# Patient Record
Sex: Female | Born: 1995 | Race: Black or African American | Hispanic: No | Marital: Single | Smoking: Current every day smoker
Health system: Southern US, Community
[De-identification: ages and names within clinical notes are randomized; demographics above are authoritative.]

## PROBLEM LIST (undated history)

## (undated) ENCOUNTER — Inpatient Hospital Stay (HOSPITAL_COMMUNITY): Payer: Self-pay

## (undated) DIAGNOSIS — L732 Hidradenitis suppurativa: Secondary | ICD-10-CM

## (undated) DIAGNOSIS — J45909 Unspecified asthma, uncomplicated: Secondary | ICD-10-CM

## (undated) DIAGNOSIS — D649 Anemia, unspecified: Secondary | ICD-10-CM

## (undated) DIAGNOSIS — H9190 Unspecified hearing loss, unspecified ear: Secondary | ICD-10-CM

## (undated) HISTORY — DX: Unspecified asthma, uncomplicated: J45.909

## (undated) HISTORY — PX: INGUINAL HIDRADENITIS EXCISION: SHX1827

## (undated) HISTORY — PX: TONSILLECTOMY AND ADENOIDECTOMY: SHX28

## (undated) HISTORY — DX: Unspecified hearing loss, unspecified ear: H91.90

## (undated) HISTORY — PX: AXILLARY HIDRADENITIS EXCISION: SUR522

## (undated) HISTORY — DX: Hidradenitis suppurativa: L73.2

## (undated) HISTORY — DX: Anemia, unspecified: D64.9

---
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
## 2014-12-24 ENCOUNTER — Encounter: Payer: Self-pay | Admitting: Adult Health

## 2014-12-24 ENCOUNTER — Ambulatory Visit (INDEPENDENT_AMBULATORY_CARE_PROVIDER_SITE_OTHER): Payer: Medicaid Other | Admitting: Adult Health

## 2014-12-24 DIAGNOSIS — Z3201 Encounter for pregnancy test, result positive: Secondary | ICD-10-CM

## 2014-12-24 DIAGNOSIS — Z3490 Encounter for supervision of normal pregnancy, unspecified, unspecified trimester: Secondary | ICD-10-CM

## 2014-12-24 DIAGNOSIS — Z3400 Encounter for supervision of normal first pregnancy, unspecified trimester: Secondary | ICD-10-CM | POA: Insufficient documentation

## 2014-12-24 LAB — POCT URINE PREGNANCY: Preg Test, Ur: POSITIVE

## 2014-12-24 MED ORDER — PRENATAL PLUS 27-1 MG PO TABS: 1.0000 | ORAL_TABLET | Freq: Every day | ORAL | Status: DC

## 2014-12-24 NOTE — Patient Instructions (Signed)
First Trimester of Pregnancy The first trimester of pregnancy is from week 1 until the end of week 12 (months 1 through 3). A week after a sperm fertilizes an egg, the egg will implant on the wall of the uterus. This embryo will begin to develop into a baby. Genes from you and your partner are forming the baby. The female genes determine whether the baby is a boy or a girl. At 6-8 weeks, the eyes and face are formed, and the heartbeat can be seen on ultrasound. At the end of 12 weeks, all the baby's organs are formed.  Now that you are pregnant, you will want to do everything you can to have a healthy baby. Two of the most important things are to get good prenatal care and to follow your health care provider's instructions. Prenatal care is all the medical care you receive before the baby's birth. This care will help prevent, find, and treat any problems during the pregnancy and childbirth. BODY CHANGES Your body goes through many changes during pregnancy. The changes vary from woman to woman.   You may gain or lose a couple of pounds at first.  You may feel sick to your stomach (nauseous) and throw up (vomit). If the vomiting is uncontrollable, call your health care provider.  You may tire easily.  You may develop headaches that can be relieved by medicines approved by your health care provider.  You may urinate more often. Painful urination may mean you have a bladder infection.  You may develop heartburn as a result of your pregnancy.  You may develop constipation because certain hormones are causing the muscles that push waste through your intestines to slow down.  You may develop hemorrhoids or swollen, bulging veins (varicose veins).  Your breasts may begin to grow larger and become tender. Your nipples may stick out more, and the tissue that surrounds them (areola) may become darker.  Your gums may bleed and may be sensitive to brushing and flossing.  Dark spots or blotches (chloasma,  mask of pregnancy) may develop on your face. This will likely fade after the baby is born.  Your menstrual periods will stop.  You may have a loss of appetite.  You may develop cravings for certain kinds of food.  You may have changes in your emotions from day to day, such as being excited to be pregnant or being concerned that something may go wrong with the pregnancy and baby.  You may have more vivid and strange dreams.  You may have changes in your hair. These can include thickening of your hair, rapid growth, and changes in texture. Some women also have hair loss during or after pregnancy, or hair that feels dry or thin. Your hair will most likely return to normal after your baby is born. WHAT TO EXPECT AT YOUR PRENATAL VISITS During a routine prenatal visit:  You will be weighed to make sure you and the baby are growing normally.  Your blood pressure will be taken.  Your abdomen will be measured to track your baby's growth.  The fetal heartbeat will be listened to starting around week 10 or 12 of your pregnancy.  Test results from any previous visits will be discussed. Your health care provider may ask you:  How you are feeling.  If you are feeling the baby move.  If you have had any abnormal symptoms, such as leaking fluid, bleeding, severe headaches, or abdominal cramping.  If you have any questions. Other tests   that may be performed during your first trimester include:  Blood tests to find your blood type and to check for the presence of any previous infections. They will also be used to check for low iron levels (anemia) and Rh antibodies. Later in the pregnancy, blood tests for diabetes will be done along with other tests if problems develop.  Urine tests to check for infections, diabetes, or protein in the urine.  An ultrasound to confirm the proper growth and development of the baby.  An amniocentesis to check for possible genetic problems.  Fetal screens for  spina bifida and Down syndrome.  You may need other tests to make sure you and the baby are doing well. HOME CARE INSTRUCTIONS  Medicines  Follow your health care provider's instructions regarding medicine use. Specific medicines may be either safe or unsafe to take during pregnancy.  Take your prenatal vitamins as directed.  If you develop constipation, try taking a stool softener if your health care provider approves. Diet  Eat regular, well-balanced meals. Choose a variety of foods, such as meat or vegetable-based protein, fish, milk and low-fat dairy products, vegetables, fruits, and whole grain breads and cereals. Your health care provider will help you determine the amount of weight gain that is right for you.  Avoid raw meat and uncooked cheese. These carry germs that can cause birth defects in the baby.  Eating four or five small meals rather than three large meals a day may help relieve nausea and vomiting. If you start to feel nauseous, eating a few soda crackers can be helpful. Drinking liquids between meals instead of during meals also seems to help nausea and vomiting.  If you develop constipation, eat more high-fiber foods, such as fresh vegetables or fruit and whole grains. Drink enough fluids to keep your urine clear or pale yellow. Activity and Exercise  Exercise only as directed by your health care provider. Exercising will help you:  Control your weight.  Stay in shape.  Be prepared for labor and delivery.  Experiencing pain or cramping in the lower abdomen or low back is a good sign that you should stop exercising. Check with your health care provider before continuing normal exercises.  Try to avoid standing for long periods of time. Move your legs often if you must stand in one place for a long time.  Avoid heavy lifting.  Wear low-heeled shoes, and practice good posture.  You may continue to have sex unless your health care provider directs you  otherwise. Relief of Pain or Discomfort  Wear a good support bra for breast tenderness.   Take warm sitz baths to soothe any pain or discomfort caused by hemorrhoids. Use hemorrhoid cream if your health care provider approves.   Rest with your legs elevated if you have leg cramps or low back pain.  If you develop varicose veins in your legs, wear support hose. Elevate your feet for 15 minutes, 3-4 times a day. Limit salt in your diet. Prenatal Care  Schedule your prenatal visits by the twelfth week of pregnancy. They are usually scheduled monthly at first, then more often in the last 2 months before delivery.  Write down your questions. Take them to your prenatal visits.  Keep all your prenatal visits as directed by your health care provider. Safety  Wear your seat belt at all times when driving.  Make a list of emergency phone numbers, including numbers for family, friends, the hospital, and police and fire departments. General Tips    Ask your health care provider for a referral to a local prenatal education class. Begin classes no later than at the beginning of month 6 of your pregnancy.  Ask for help if you have counseling or nutritional needs during pregnancy. Your health care provider can offer advice or refer you to specialists for help with various needs.  Do not use hot tubs, steam rooms, or saunas.  Do not douche or use tampons or scented sanitary pads.  Do not cross your legs for long periods of time.  Avoid cat litter boxes and soil used by cats. These carry germs that can cause birth defects in the baby and possibly loss of the fetus by miscarriage or stillbirth.  Avoid all smoking, herbs, alcohol, and medicines not prescribed by your health care provider. Chemicals in these affect the formation and growth of the baby.  Schedule a dentist appointment. At home, brush your teeth with a soft toothbrush and be gentle when you floss. SEEK MEDICAL CARE IF:   You have  dizziness.  You have mild pelvic cramps, pelvic pressure, or nagging pain in the abdominal area.  You have persistent nausea, vomiting, or diarrhea.  You have a bad smelling vaginal discharge.  You have pain with urination.  You notice increased swelling in your face, hands, legs, or ankles. SEEK IMMEDIATE MEDICAL CARE IF:   You have a fever.  You are leaking fluid from your vagina.  You have spotting or bleeding from your vagina.  You have severe abdominal cramping or pain.  You have rapid weight gain or loss.  You vomit blood or material that looks like coffee grounds.  You are exposed to German measles and have never had them.  You are exposed to fifth disease or chickenpox.  You develop a severe headache.  You have shortness of breath.  You have any kind of trauma, such as from a fall or a car accident. Document Released: 11/15/2001 Document Revised: 04/07/2014 Document Reviewed: 10/01/2013 [employer] Patient Information 2015 [employer]. This information is not intended to replace advice given to you by your health care provider. Make sure you discuss any questions you have with your health care provider. Return in 2 weeks for dating US 

## 2014-12-24 NOTE — Progress Notes (Signed)
Subjective:     Patient ID: Loretta Moore, female   DOB: 05/05/1996, 19 y.o.   MRN: [id number]  HPI Monico BlitzJaylin is a 19 year old black female in to get birth control and has +UPT, has some cramping, no bleeding.  Review of Systems See HPI Reviewed past medical,surgical, social and family history. Reviewed medications and allergies.     Objective:   Physical Exam BP 112/70 mmHg  Ht 5' (1.524 m)  Wt 206 lb (93.441 kg)  BMI 40.23 kg/m2  LMP 11/20/2014   UPT + about 5 weeks by LMP, EDD 08/29/15, medicaid form given, Ok to take tylenol for pain, has hidradenitis.  Assessment:     Pregnant +UPT    Plan:    Rx prenatal plus #30 1 daily with 11 refills Return in 2 weeks for dating US Review handouts on first trimester and OB packet given

## 2015-01-05 ENCOUNTER — Telehealth: Payer: Self-pay | Admitting: Women's Health

## 2015-01-05 NOTE — Telephone Encounter (Signed)
Pt states she thinks she has had an allergic reaction to cinnamon, broke out in hives this am. Pt informed per Genelle GatherJennifer Griffin,NP can take Benadryl. If no improvement pt informed to call office back. Pt verbalized understanding.

## 2015-01-07 ENCOUNTER — Ambulatory Visit: Payer: Medicaid Other

## 2015-01-12 ENCOUNTER — Other Ambulatory Visit: Payer: Self-pay | Admitting: Adult Health

## 2015-01-12 ENCOUNTER — Ambulatory Visit (INDEPENDENT_AMBULATORY_CARE_PROVIDER_SITE_OTHER): Payer: Medicaid Other

## 2015-01-12 DIAGNOSIS — O26841 Uterine size-date discrepancy, first trimester: Secondary | ICD-10-CM

## 2015-01-12 NOTE — Progress Notes (Signed)
U/S-single IUP with +FCA noted, FHR- 171 bpm, CRL c/w 8+4 wks EDD 08/20/2015, cx appears closed, bilateral adnexa appears WNL

## 2015-01-20 ENCOUNTER — Encounter: Payer: Medicaid Other | Admitting: Women's Health

## 2015-01-27 ENCOUNTER — Encounter: Payer: Medicaid Other | Admitting: Women's Health

## 2015-02-04 ENCOUNTER — Encounter: Payer: Self-pay | Admitting: Advanced Practice Midwife

## 2015-02-04 ENCOUNTER — Ambulatory Visit (INDEPENDENT_AMBULATORY_CARE_PROVIDER_SITE_OTHER): Payer: Medicaid Other | Admitting: Advanced Practice Midwife

## 2015-02-04 VITALS — BP 130/50 | HR 100 | Wt 220.0 lb

## 2015-02-04 DIAGNOSIS — Z118 Encounter for screening for other infectious and parasitic diseases: Secondary | ICD-10-CM

## 2015-02-04 DIAGNOSIS — Z3401 Encounter for supervision of normal first pregnancy, first trimester: Secondary | ICD-10-CM

## 2015-02-04 DIAGNOSIS — Z331 Pregnant state, incidental: Secondary | ICD-10-CM

## 2015-02-04 DIAGNOSIS — L732 Hidradenitis suppurativa: Secondary | ICD-10-CM | POA: Insufficient documentation

## 2015-02-04 DIAGNOSIS — Z1389 Encounter for screening for other disorder: Secondary | ICD-10-CM

## 2015-02-04 DIAGNOSIS — Z130 Encounter for screening for diseases of the blood and blood-forming organs and certain disorders involving the immune mechanism: Secondary | ICD-10-CM

## 2015-02-04 DIAGNOSIS — Z113 Encounter for screening for infections with a predominantly sexual mode of transmission: Secondary | ICD-10-CM

## 2015-02-04 DIAGNOSIS — Z3490 Encounter for supervision of normal pregnancy, unspecified, unspecified trimester: Secondary | ICD-10-CM

## 2015-02-04 DIAGNOSIS — Z0184 Encounter for antibody response examination: Secondary | ICD-10-CM

## 2015-02-04 DIAGNOSIS — Z114 Encounter for screening for human immunodeficiency virus [HIV]: Secondary | ICD-10-CM

## 2015-02-04 DIAGNOSIS — Z3491 Encounter for supervision of normal pregnancy, unspecified, first trimester: Secondary | ICD-10-CM

## 2015-02-04 DIAGNOSIS — H9190 Unspecified hearing loss, unspecified ear: Secondary | ICD-10-CM | POA: Insufficient documentation

## 2015-02-04 DIAGNOSIS — Z0283 Encounter for blood-alcohol and blood-drug test: Secondary | ICD-10-CM

## 2015-02-04 DIAGNOSIS — Z1371 Encounter for nonprocreative screening for genetic disease carrier status: Secondary | ICD-10-CM

## 2015-02-04 LAB — POCT URINALYSIS DIPSTICK
GLUCOSE UA: NEGATIVE
Ketones, UA: NEGATIVE
Nitrite, UA: NEGATIVE
Protein, UA: NEGATIVE
RBC UA: NEGATIVE

## 2015-02-04 LAB — OB RESULTS CONSOLE GC/CHLAMYDIA
Chlamydia: POSITIVE
Gonorrhea: NEGATIVE

## 2015-02-04 LAB — OB RESULTS CONSOLE RUBELLA ANTIBODY, IGM: Rubella: IMMUNE

## 2015-02-04 LAB — OB RESULTS CONSOLE ABO/RH: RH Type: POSITIVE

## 2015-02-04 NOTE — Patient Instructions (Signed)
First Trimester of Pregnancy The first trimester of pregnancy is from week 1 until the end of week 12 (months 1 through 3). A week after a sperm fertilizes an egg, the egg will implant on the wall of the uterus. This embryo will begin to develop into a baby. Genes from you and your partner are forming the baby. The female genes determine whether the baby is a boy or a girl. At 6-8 weeks, the eyes and face are formed, and the heartbeat can be seen on ultrasound. At the end of 12 weeks, all the baby's organs are formed.  Now that you are pregnant, you will want to do everything you can to have a healthy baby. Two of the most important things are to get good prenatal care and to follow your health care provider's instructions. Prenatal care is all the medical care you receive before the baby's birth. This care will help prevent, find, and treat any problems during the pregnancy and childbirth. BODY CHANGES Your body goes through many changes during pregnancy. The changes vary from woman to woman.   You may gain or lose a couple of pounds at first.  You may feel sick to your stomach (nauseous) and throw up (vomit). If the vomiting is uncontrollable, call your health care provider.  You may tire easily.  You may develop headaches that can be relieved by medicines approved by your health care provider.  You may urinate more often. Painful urination may mean you have a bladder infection.  You may develop heartburn as a result of your pregnancy.  You may develop constipation because certain hormones are causing the muscles that push waste through your intestines to slow down.  You may develop hemorrhoids or swollen, bulging veins (varicose veins).  Your breasts may begin to grow larger and become tender. Your nipples may stick out more, and the tissue that surrounds them (areola) may become darker.  Your gums may bleed and may be sensitive to brushing and flossing.  Dark spots or blotches (chloasma,  mask of pregnancy) may develop on your face. This will likely fade after the baby is born.  Your menstrual periods will stop.  You may have a loss of appetite.  You may develop cravings for certain kinds of food.  You may have changes in your emotions from day to day, such as being excited to be pregnant or being concerned that something may go wrong with the pregnancy and baby.  You may have more vivid and strange dreams.  You may have changes in your hair. These can include thickening of your hair, rapid growth, and changes in texture. Some women also have hair loss during or after pregnancy, or hair that feels dry or thin. Your hair will most likely return to normal after your baby is born. WHAT TO EXPECT AT YOUR PRENATAL VISITS During a routine prenatal visit:  You will be weighed to make sure you and the baby are growing normally.  Your blood pressure will be taken.  Your abdomen will be measured to track your baby's growth.  The fetal heartbeat will be listened to starting around week 10 or 12 of your pregnancy.  Test results from any previous visits will be discussed. Your health care provider may ask you:  How you are feeling.  If you are feeling the baby move.  If you have had any abnormal symptoms, such as leaking fluid, bleeding, severe headaches, or abdominal cramping.  If you have any questions. Other tests   that may be performed during your first trimester include:  Blood tests to find your blood type and to check for the presence of any previous infections. They will also be used to check for low iron levels (anemia) and Rh antibodies. Later in the pregnancy, blood tests for diabetes will be done along with other tests if problems develop.  Urine tests to check for infections, diabetes, or protein in the urine.  An ultrasound to confirm the proper growth and development of the baby.  An amniocentesis to check for possible genetic problems.  Fetal screens for  spina bifida and Down syndrome.  You may need other tests to make sure you and the baby are doing well. HOME CARE INSTRUCTIONS  Medicines  Follow your health care provider's instructions regarding medicine use. Specific medicines may be either safe or unsafe to take during pregnancy.  Take your prenatal vitamins as directed.  If you develop constipation, try taking a stool softener if your health care provider approves. Diet  Eat regular, well-balanced meals. Choose a variety of foods, such as meat or vegetable-based protein, fish, milk and low-fat dairy products, vegetables, fruits, and whole grain breads and cereals. Your health care provider will help you determine the amount of weight gain that is right for you.  Avoid raw meat and uncooked cheese. These carry germs that can cause birth defects in the baby.  Eating four or five small meals rather than three large meals a day may help relieve nausea and vomiting. If you start to feel nauseous, eating a few soda crackers can be helpful. Drinking liquids between meals instead of during meals also seems to help nausea and vomiting.  If you develop constipation, eat more high-fiber foods, such as fresh vegetables or fruit and whole grains. Drink enough fluids to keep your urine clear or pale yellow. Activity and Exercise  Exercise only as directed by your health care provider. Exercising will help you:  Control your weight.  Stay in shape.  Be prepared for labor and delivery.  Experiencing pain or cramping in the lower abdomen or low back is a good sign that you should stop exercising. Check with your health care provider before continuing normal exercises.  Try to avoid standing for long periods of time. Move your legs often if you must stand in one place for a long time.  Avoid heavy lifting.  Wear low-heeled shoes, and practice good posture.  You may continue to have sex unless your health care provider directs you  otherwise. Relief of Pain or Discomfort  Wear a good support bra for breast tenderness.   Take warm sitz baths to soothe any pain or discomfort caused by hemorrhoids. Use hemorrhoid cream if your health care provider approves.   Rest with your legs elevated if you have leg cramps or low back pain.  If you develop varicose veins in your legs, wear support hose. Elevate your feet for 15 minutes, 3-4 times a day. Limit salt in your diet. Prenatal Care  Schedule your prenatal visits by the twelfth week of pregnancy. They are usually scheduled monthly at first, then more often in the last 2 months before delivery.  Write down your questions. Take them to your prenatal visits.  Keep all your prenatal visits as directed by your health care provider. Safety  Wear your seat belt at all times when driving.  Make a list of emergency phone numbers, including numbers for family, friends, the hospital, and police and fire departments. General Tips    Ask your health care provider for a referral to a local prenatal education class. Begin classes no later than at the beginning of month 6 of your pregnancy.  Ask for help if you have counseling or nutritional needs during pregnancy. Your health care provider can offer advice or refer you to specialists for help with various needs.  Do not use hot tubs, steam rooms, or saunas.  Do not douche or use tampons or scented sanitary pads.  Do not cross your legs for long periods of time.  Avoid cat litter boxes and soil used by cats. These carry germs that can cause birth defects in the baby and possibly loss of the fetus by miscarriage or stillbirth.  Avoid all smoking, herbs, alcohol, and medicines not prescribed by your health care provider. Chemicals in these affect the formation and growth of the baby.  Schedule a dentist appointment. At home, brush your teeth with a soft toothbrush and be gentle when you floss. SEEK MEDICAL CARE IF:   You have  dizziness.  You have mild pelvic cramps, pelvic pressure, or nagging pain in the abdominal area.  You have persistent nausea, vomiting, or diarrhea.  You have a bad smelling vaginal discharge.  You have pain with urination.  You notice increased swelling in your face, hands, legs, or ankles. SEEK IMMEDIATE MEDICAL CARE IF:   You have a fever.  You are leaking fluid from your vagina.  You have spotting or bleeding from your vagina.  You have severe abdominal cramping or pain.  You have rapid weight gain or loss.  You vomit blood or material that looks like coffee grounds.  You are exposed to German measles and have never had them.  You are exposed to fifth disease or chickenpox.  You develop a severe headache.  You have shortness of breath.  You have any kind of trauma, such as from a fall or a car accident. Document Released: 11/15/2001 Document Revised: 04/07/2014 Document Reviewed: 10/01/2013 [employer] Patient Information 2015 [employer]. This information is not intended to replace advice given to you by your health care provider. Make sure you discuss any questions you have with your health care provider.  

## 2015-02-04 NOTE — Progress Notes (Signed)
  Subjective:    Loretta Moore is a G1P0 4559w6d being seen today for her first obstetrical visit.  Her obstetrical history is significant for nothing   .  Pregnancy history fully reviewed.  Patient reports no complaints.  Filed Vitals:   02/04/15 1332  BP: 130/50  Pulse: 100  Weight: 220 lb (99.791 kg)    HISTORY: OB History  Gravida Para Term Preterm AB SAB TAB Ectopic Multiple Living  1             # Outcome Date GA Lbr Len/2nd Weight Sex Delivery Anes PTL Lv  1 Current              Past Medical History  Diagnosis Date  . Anemia   . Asthma   . Hidradenitis suppurativa     had excisions in axilla and groin  . Pregnant 12/24/2014  . Hard of hearing    Past Surgical History  Procedure Laterality Date  . Axillary hidradenitis excision    . Inguinal hidradenitis excision    . Tonsillectomy and adenoidectomy     Family History  Problem Relation Age of Onset  . Anemia Mother   . Hypertension Mother   . Other Mother     hidradenitis  . Epilepsy Father   . Other Sister     hidradenitis  . Epilepsy Brother   . Other Paternal Grandmother     aneursym  . Cancer Paternal Grandfather      Exam       Pelvic Exam:    Perineum: Normal Perineum   Vulva: normal   Vagina:  normal mucosa, normal discharge, no palpable nodules   Uterus Normal, Gravid, FH: 11     Cervix: normal   Adnexa: Not palpable   Urinary:  urethral meatus normal    System: Breast:  normal appearance, no masses or tenderness   Skin: normal coloration and turgor, no rashes    Neurologic: oriented, normal, normal mood   Extremities: normal strength, tone, and muscle mass   HEENT PERRLA   Mouth/Teeth mucous membranes moist, normal dentition   Neck supple and no masses   Cardiovascular: regular rate and rhythm   Respiratory:  appears well, vitals normal, no respiratory distress, acyanotic   Abdomen: soft, non-tender;  FHR: 150          Assessment:    Pregnancy: G1P0 Patient Active  Problem List   Diagnosis Date Noted  . Hidradenitis suppurativa   . Hard of hearing   . Pregnant 12/24/2014        Plan:     Initial labs drawn. Continue prenatal vitamins  Problem list reviewed and updated  Reviewed n/v relief measures and warning s/s to report  Reviewed recommended weight gain based on pre-gravid BMI  Encouraged well-balanced diet Genetic Screening discussed Integrated Screen: requested.  Ultrasound discussed; fetal survey: requested.  Follow up in 1 weeks for NT/IT, 4 weeks for OBV.  CRESENZO-DISHMAN,Lilliane Sposito 02/04/2015

## 2015-02-05 ENCOUNTER — Encounter: Payer: Self-pay | Admitting: Advanced Practice Midwife

## 2015-02-05 DIAGNOSIS — A749 Chlamydial infection, unspecified: Secondary | ICD-10-CM | POA: Insufficient documentation

## 2015-02-05 LAB — GC/CHLAMYDIA PROBE AMP
CHLAMYDIA, DNA PROBE: POSITIVE — AB
Neisseria gonorrhoeae by PCR: NEGATIVE

## 2015-02-06 ENCOUNTER — Other Ambulatory Visit: Payer: Self-pay | Admitting: Obstetrics and Gynecology

## 2015-02-06 DIAGNOSIS — Z3682 Encounter for antenatal screening for nuchal translucency: Secondary | ICD-10-CM

## 2015-02-06 LAB — URINE CULTURE

## 2015-02-09 ENCOUNTER — Other Ambulatory Visit: Payer: Medicaid Other

## 2015-02-09 ENCOUNTER — Ambulatory Visit (INDEPENDENT_AMBULATORY_CARE_PROVIDER_SITE_OTHER): Payer: Medicaid Other

## 2015-02-09 DIAGNOSIS — Z36 Encounter for antenatal screening of mother: Secondary | ICD-10-CM

## 2015-02-09 DIAGNOSIS — Z3682 Encounter for antenatal screening for nuchal translucency: Secondary | ICD-10-CM

## 2015-02-09 DIAGNOSIS — Z3401 Encounter for supervision of normal first pregnancy, first trimester: Secondary | ICD-10-CM

## 2015-02-09 LAB — CBC
HEMATOCRIT: 33.7 % — AB (ref 34.0–46.6)
Hemoglobin: 11.3 g/dL (ref 11.1–15.9)
MCH: 29.4 pg (ref 26.6–33.0)
MCHC: 33.5 g/dL (ref 31.5–35.7)
MCV: 88 fL (ref 79–97)
PLATELETS: 283 10*3/uL (ref 150–379)
RBC: 3.85 x10E6/uL (ref 3.77–5.28)
RDW: 15.3 % (ref 12.3–15.4)
WBC: 7.9 10*3/uL (ref 3.4–10.8)

## 2015-02-09 LAB — URINALYSIS, ROUTINE W REFLEX MICROSCOPIC
Bilirubin, UA: NEGATIVE
GLUCOSE, UA: NEGATIVE
KETONES UA: NEGATIVE
Nitrite, UA: NEGATIVE
PH UA: 6 (ref 5.0–7.5)
Protein, UA: NEGATIVE
RBC, UA: NEGATIVE
Urobilinogen, Ur: 0.2 mg/dL (ref 0.2–1.0)

## 2015-02-09 LAB — HEPATITIS B SURFACE ANTIGEN: Hepatitis B Surface Ag: NEGATIVE

## 2015-02-09 LAB — CYSTIC FIBROSIS MUTATION 97: GENE DIS ANAL CARRIER INTERP BLD/T-IMP: NOT DETECTED

## 2015-02-09 LAB — PMP SCREEN PROFILE (10S), URINE
Amphetamine Screen, Ur: NEGATIVE ng/mL
BENZODIAZEPINE SCREEN, URINE: NEGATIVE ng/mL
Barbiturate Screen, Ur: NEGATIVE ng/mL
Cannabinoids Ur Ql Scn: POSITIVE ng/mL
Cocaine(Metab.)Screen, Urine: NEGATIVE ng/mL
Creatinine(Crt), U: 155.5 mg/dL (ref 20.0–300.0)
Methadone Scn, Ur: NEGATIVE ng/mL
OPIATE SCRN UR: NEGATIVE ng/mL
OXYCODONE+OXYMORPHONE UR QL SCN: NEGATIVE ng/mL
PCP Scrn, Ur: NEGATIVE ng/mL
Ph of Urine: 5.6 (ref 4.5–8.9)
Propoxyphene, Screen: NEGATIVE ng/mL

## 2015-02-09 LAB — MICROSCOPIC EXAMINATION: Casts: NONE SEEN /lpf

## 2015-02-09 LAB — VARICELLA ZOSTER ANTIBODY, IGG: Varicella zoster IgG: 450 index (ref >165)

## 2015-02-09 LAB — ABO/RH: Rh Factor: POSITIVE

## 2015-02-09 LAB — RPR: RPR: NONREACTIVE

## 2015-02-09 LAB — SICKLE CELL SCREEN: Sickle Cell Screen: NEGATIVE

## 2015-02-09 LAB — ANTIBODY SCREEN: ANTIBODY SCREEN: NEGATIVE

## 2015-02-09 LAB — RUBELLA SCREEN: Rubella Antibodies, IGG: 3.51 index (ref >0.99)

## 2015-02-09 LAB — HIV ANTIBODY (ROUTINE TESTING W REFLEX): HIV Screen 4th Generation wRfx: NONREACTIVE

## 2015-02-09 NOTE — Progress Notes (Signed)
U/S(12+4wks)-single IUP with +FCA noted, FHR-160 bpm, CRL c/w dates, cx appears closed (3.4cm), bilateral adnexa appears WNL, NB present, NT-1.6544mm, anterior Gr 0 placenta

## 2015-02-11 ENCOUNTER — Other Ambulatory Visit: Payer: Self-pay | Admitting: Advanced Practice Midwife

## 2015-02-11 ENCOUNTER — Telehealth: Payer: Self-pay | Admitting: *Deleted

## 2015-02-11 LAB — MATERNAL SCREEN, INTEGRATED #1
Crown Rump Length: 60.3 mm
GEST. AGE ON COLLECTION DATE: 12.4 wk
Maternal Age at EDD: 21 years
NUMBER OF FETUSES: 1
Nuchal Translucency (NT): 1.4 mm
PAPP-A Value: 531.4 ng/mL
Weight: 219 [lb_av]

## 2015-02-11 MED ORDER — AZITHROMYCIN 500 MG PO TABS: 1000.0000 mg | ORAL_TABLET | Freq: Every day | ORAL | Status: DC

## 2015-02-11 NOTE — Telephone Encounter (Signed)
Spoke with pt. Pt was given a med for + CHL and was wondering how partner was going to be treated. I advised she will fill rx for herself and fill the refill for her partner. Advised no sex until after POC appt in 4 weeks. Pt voiced understanding. JSY

## 2015-02-11 NOTE — Progress Notes (Signed)
Pt informed of + chl.. Rx for pt and partner sent  No sex until POC

## 2015-03-04 ENCOUNTER — Encounter: Payer: Self-pay | Admitting: Women's Health

## 2015-03-04 ENCOUNTER — Ambulatory Visit (INDEPENDENT_AMBULATORY_CARE_PROVIDER_SITE_OTHER): Payer: Medicaid Other | Admitting: Women's Health

## 2015-03-04 VITALS — BP 100/60 | HR 81 | Wt 223.0 lb

## 2015-03-04 DIAGNOSIS — Z1389 Encounter for screening for other disorder: Secondary | ICD-10-CM

## 2015-03-04 DIAGNOSIS — Z369 Encounter for antenatal screening, unspecified: Secondary | ICD-10-CM

## 2015-03-04 DIAGNOSIS — Z3402 Encounter for supervision of normal first pregnancy, second trimester: Secondary | ICD-10-CM

## 2015-03-04 DIAGNOSIS — Z363 Encounter for antenatal screening for malformations: Secondary | ICD-10-CM

## 2015-03-04 DIAGNOSIS — Z331 Pregnant state, incidental: Secondary | ICD-10-CM

## 2015-03-04 LAB — POCT URINALYSIS DIPSTICK
Glucose, UA: NEGATIVE
KETONES UA: NEGATIVE
LEUKOCYTES UA: NEGATIVE
NITRITE UA: NEGATIVE
Protein, UA: NEGATIVE
RBC UA: NEGATIVE

## 2015-03-04 NOTE — Progress Notes (Signed)
Low-risk OB appointment G1P0 [id number] Estimated Date of Delivery: 08/20/15 BP 100/60 mmHg  Pulse 81  Wt 223 lb (101.152 kg)  LMP 11/20/2014  BP, weight, and urine reviewed.  Refer to obstetrical flow sheet for FH & FHR.  No fm yet. Denies cramping, lof, vb, or uti s/s. No complaints. Reviewed warning s/s to report. Recommended flu shot at HD or PCP Plan:  Continue routine obstetrical care  F/U in 4wks for OB appointment, anatomy u/s, and CT POC 2nd IT today

## 2015-03-04 NOTE — Patient Instructions (Signed)
Second Trimester of Pregnancy The second trimester is from week 13 through week 28, months 4 through 6. The second trimester is often a time when you feel your best. Your body has also adjusted to being pregnant, and you begin to feel better physically. Usually, morning sickness has lessened or quit completely, you may have more energy, and you may have an increase in appetite. The second trimester is also a time when the fetus is growing rapidly. At the end of the sixth month, the fetus is about 9 inches long and weighs about 1 pounds. You will likely begin to feel the baby move (quickening) between 18 and 20 weeks of the pregnancy. BODY CHANGES Your body goes through many changes during pregnancy. The changes vary from woman to woman.   Your weight will continue to increase. You will notice your lower abdomen bulging out.  You may begin to get stretch marks on your hips, abdomen, and breasts.  You may develop headaches that can be relieved by medicines approved by your health care provider.  You may urinate more often because the fetus is pressing on your bladder.  You may develop or continue to have heartburn as a result of your pregnancy.  You may develop constipation because certain hormones are causing the muscles that push waste through your intestines to slow down.  You may develop hemorrhoids or swollen, bulging veins (varicose veins).  You may have back pain because of the weight gain and pregnancy hormones relaxing your joints between the bones in your pelvis and as a result of a shift in weight and the muscles that support your balance.  Your breasts will continue to grow and be tender.  Your gums may bleed and may be sensitive to brushing and flossing.  Dark spots or blotches (chloasma, mask of pregnancy) may develop on your face. This will likely fade after the baby is born.  A dark line from your belly button to the pubic area (linea nigra) may appear. This will likely fade  after the baby is born.  You may have changes in your hair. These can include thickening of your hair, rapid growth, and changes in texture. Some women also have hair loss during or after pregnancy, or hair that feels dry or thin. Your hair will most likely return to normal after your baby is born. WHAT TO EXPECT AT YOUR PRENATAL VISITS During a routine prenatal visit:  You will be weighed to make sure you and the fetus are growing normally.  Your blood pressure will be taken.  Your abdomen will be measured to track your baby's growth.  The fetal heartbeat will be listened to.  Any test results from the previous visit will be discussed. Your health care provider may ask you:  How you are feeling.  If you are feeling the baby move.  If you have had any abnormal symptoms, such as leaking fluid, bleeding, severe headaches, or abdominal cramping.  If you have any questions. Other tests that may be performed during your second trimester include:  Blood tests that check for:  Low iron levels (anemia).  Gestational diabetes (between 24 and 28 weeks).  Rh antibodies.  Urine tests to check for infections, diabetes, or protein in the urine.  An ultrasound to confirm the proper growth and development of the baby.  An amniocentesis to check for possible genetic problems.  Fetal screens for spina bifida and Down syndrome. HOME CARE INSTRUCTIONS   Avoid all smoking, herbs, alcohol, and unprescribed   drugs. These chemicals affect the formation and growth of the baby.  Follow your health care provider's instructions regarding medicine use. There are medicines that are either safe or unsafe to take during pregnancy.  Exercise only as directed by your health care provider. Experiencing uterine cramps is a good sign to stop exercising.  Continue to eat regular, healthy meals.  Wear a good support bra for breast tenderness.  Do not use hot tubs, steam rooms, or saunas.  Wear your  seat belt at all times when driving.  Avoid raw meat, uncooked cheese, cat litter boxes, and soil used by cats. These carry germs that can cause birth defects in the baby.  Take your prenatal vitamins.  Try taking a stool softener (if your health care provider approves) if you develop constipation. Eat more high-fiber foods, such as fresh vegetables or fruit and whole grains. Drink plenty of fluids to keep your urine clear or pale yellow.  Take warm sitz baths to soothe any pain or discomfort caused by hemorrhoids. Use hemorrhoid cream if your health care provider approves.  If you develop varicose veins, wear support hose. Elevate your feet for 15 minutes, 3-4 times a day. Limit salt in your diet.  Avoid heavy lifting, wear low heel shoes, and practice good posture.  Rest with your legs elevated if you have leg cramps or low back pain.  Visit your dentist if you have not gone yet during your pregnancy. Use a soft toothbrush to brush your teeth and be gentle when you floss.  A sexual relationship may be continued unless your health care provider directs you otherwise.  Continue to go to all your prenatal visits as directed by your health care provider. SEEK MEDICAL CARE IF:   You have dizziness.  You have mild pelvic cramps, pelvic pressure, or nagging pain in the abdominal area.  You have persistent nausea, vomiting, or diarrhea.  You have a bad smelling vaginal discharge.  You have pain with urination. SEEK IMMEDIATE MEDICAL CARE IF:   You have a fever.  You are leaking fluid from your vagina.  You have spotting or bleeding from your vagina.  You have severe abdominal cramping or pain.  You have rapid weight gain or loss.  You have shortness of breath with chest pain.  You notice sudden or extreme swelling of your face, hands, ankles, feet, or legs.  You have not felt your baby move in over an hour.  You have severe headaches that do not go away with  medicine.  You have vision changes. Document Released: 11/15/2001 Document Revised: 11/26/2013 Document Reviewed: 01/22/2013 [employer] Patient Information 2015 [employer]. This information is not intended to replace advice given to you by your health care provider. Make sure you discuss any questions you have with your health care provider.  

## 2015-03-06 LAB — MATERNAL SCREEN, INTEGRATED #2
AFP MOM: 1.18
Alpha-Fetoprotein: 28.4 ng/mL
CROWN RUMP LENGTH: 60.3 mm
DIA MOM: 2.54
DIA VALUE: 376.9 pg/mL
ESTRIOL UNCONJUGATED: 0.49 ng/mL
GEST. AGE ON COLLECTION DATE: 12.4 wk
GESTATIONAL AGE: 15.7 wk
Maternal Age at EDD: 21 years
NUCHAL TRANSLUCENCY (NT): 1.4 mm
NUMBER OF FETUSES: 1
Nuchal Translucency MoM: 1.1
PAPP-A MoM: 0.88
PAPP-A Value: 531.4 ng/mL
Test Results:: NEGATIVE
WEIGHT: 219 [lb_av]
Weight: 223 [lb_av]
hCG MoM: 1.3
hCG Value: 37.1 IU/mL
uE3 MoM: 0.7

## 2015-03-27 ENCOUNTER — Telehealth: Payer: Self-pay | Admitting: Obstetrics & Gynecology

## 2015-03-27 ENCOUNTER — Other Ambulatory Visit: Payer: Self-pay | Admitting: Women's Health

## 2015-03-27 DIAGNOSIS — O99210 Obesity complicating pregnancy, unspecified trimester: Secondary | ICD-10-CM

## 2015-03-27 DIAGNOSIS — Z363 Encounter for antenatal screening for malformations: Secondary | ICD-10-CM

## 2015-03-27 NOTE — Telephone Encounter (Signed)
Pt states had abdominal pain yesterday after walking and called after line nurse who advised her to call our office back this am. Pt states has not had any pain this am, +FM, no vaginal. Pt informed to push fluids and can take tylenol for discomfort or pain. Pt verbalized understanding.

## 2015-04-01 ENCOUNTER — Ambulatory Visit (INDEPENDENT_AMBULATORY_CARE_PROVIDER_SITE_OTHER): Payer: Medicaid Other | Admitting: Women's Health

## 2015-04-01 ENCOUNTER — Encounter: Payer: Self-pay | Admitting: Women's Health

## 2015-04-01 ENCOUNTER — Ambulatory Visit (INDEPENDENT_AMBULATORY_CARE_PROVIDER_SITE_OTHER): Payer: Medicaid Other

## 2015-04-01 VITALS — BP 108/50 | HR 68 | Wt 227.0 lb

## 2015-04-01 DIAGNOSIS — Z1389 Encounter for screening for other disorder: Secondary | ICD-10-CM

## 2015-04-01 DIAGNOSIS — O99210 Obesity complicating pregnancy, unspecified trimester: Secondary | ICD-10-CM

## 2015-04-01 DIAGNOSIS — A749 Chlamydial infection, unspecified: Secondary | ICD-10-CM

## 2015-04-01 DIAGNOSIS — Z3402 Encounter for supervision of normal first pregnancy, second trimester: Secondary | ICD-10-CM

## 2015-04-01 DIAGNOSIS — Z1159 Encounter for screening for other viral diseases: Secondary | ICD-10-CM

## 2015-04-01 DIAGNOSIS — Z36 Encounter for antenatal screening of mother: Secondary | ICD-10-CM

## 2015-04-01 DIAGNOSIS — Z331 Pregnant state, incidental: Secondary | ICD-10-CM

## 2015-04-01 DIAGNOSIS — Z118 Encounter for screening for other infectious and parasitic diseases: Secondary | ICD-10-CM

## 2015-04-01 DIAGNOSIS — Z363 Encounter for antenatal screening for malformations: Secondary | ICD-10-CM

## 2015-04-01 LAB — OB RESULTS CONSOLE GC/CHLAMYDIA
Chlamydia: NEGATIVE
Gonorrhea: NEGATIVE

## 2015-04-01 LAB — POCT URINALYSIS DIPSTICK
Blood, UA: NEGATIVE
GLUCOSE UA: NEGATIVE
KETONES UA: NEGATIVE
LEUKOCYTES UA: NEGATIVE
Nitrite, UA: NEGATIVE
Protein, UA: NEGATIVE

## 2015-04-01 NOTE — Patient Instructions (Signed)
Circumcision: $507 at hospital, [facility], has to be paid up front before it is done. If you [facility] you can make payments during pregnancy. If you are interested in this, see receptionist at check-out.  If your baby is older than 28 days when you have the circumcision done at [facility], the fee will go up to $325.50.    Second Trimester of Pregnancy The second trimester is from week 13 through week 28, months 4 through 6. The second trimester is often a time when you feel your best. Your body has also adjusted to being pregnant, and you begin to feel better physically. Usually, morning sickness has lessened or quit completely, you may have more energy, and you may have an increase in appetite. The second trimester is also a time when the fetus is growing rapidly. At the end of the sixth month, the fetus is about 9 inches long and weighs about 1 pounds. You will likely begin to feel the baby move (quickening) between 18 and 20 weeks of the pregnancy. BODY CHANGES Your body goes through many changes during pregnancy. The changes vary from woman to woman.   Your weight will continue to increase. You will notice your lower abdomen bulging out.  You may begin to get stretch marks on your hips, abdomen, and breasts.  You may develop headaches that can be relieved by medicines approved by your health care provider.  You may urinate more often because the fetus is pressing on your bladder.  You may develop or continue to have heartburn as a result of your pregnancy.  You may develop constipation because certain hormones are causing the muscles that push waste through your intestines to slow down.  You may develop hemorrhoids or swollen, bulging veins (varicose veins).  You may have back pain because of the weight gain and pregnancy hormones relaxing your joints between the bones in your pelvis and as a result of a shift in weight and the muscles that  support your balance.  Your breasts will continue to grow and be tender.  Your gums may bleed and may be sensitive to brushing and flossing.  Dark spots or blotches (chloasma, mask of pregnancy) may develop on your face. This will likely fade after the baby is born.  A dark line from your belly button to the pubic area (linea nigra) may appear. This will likely fade after the baby is born.  You may have changes in your hair. These can include thickening of your hair, rapid growth, and changes in texture. Some women also have hair loss during or after pregnancy, or hair that feels dry or thin. Your hair will most likely return to normal after your baby is born. WHAT TO EXPECT AT YOUR PRENATAL VISITS During a routine prenatal visit:  You will be weighed to make sure you and the fetus are growing normally.  Your blood pressure will be taken.  Your abdomen will be measured to track your baby's growth.  The fetal heartbeat will be listened to.  Any test results from the previous visit will be discussed. Your health care provider may ask you:  How you are feeling.  If you are feeling the baby move.  If you have had any abnormal symptoms, such as leaking fluid, bleeding, severe headaches, or abdominal cramping.  If you have any questions. Other tests that may be performed during your second trimester include:  Blood tests that check for:  Low iron levels (anemia).  Gestational diabetes (between 24 and 28 weeks).  Rh antibodies.  Urine tests to check for infections, diabetes, or protein in the urine.  An ultrasound to confirm the proper growth and development of the baby.  An amniocentesis to check for possible genetic problems.  Fetal screens for spina bifida and Down syndrome. HOME CARE INSTRUCTIONS   Avoid all smoking, herbs, alcohol, and unprescribed drugs. These chemicals affect the formation and growth of the baby.  Follow your health care provider's instructions  regarding medicine use. There are medicines that are either safe or unsafe to take during pregnancy.  Exercise only as directed by your health care provider. Experiencing uterine cramps is a good sign to stop exercising.  Continue to eat regular, healthy meals.  Wear a good support bra for breast tenderness.  Do not use hot tubs, steam rooms, or saunas.  Wear your seat belt at all times when driving.  Avoid raw meat, uncooked cheese, cat litter boxes, and soil used by cats. These carry germs that can cause birth defects in the baby.  Take your prenatal vitamins.  Try taking a stool softener (if your health care provider approves) if you develop constipation. Eat more high-fiber foods, such as fresh vegetables or fruit and whole grains. Drink plenty of fluids to keep your urine clear or pale yellow.  Take warm sitz baths to soothe any pain or discomfort caused by hemorrhoids. Use hemorrhoid cream if your health care provider approves.  If you develop varicose veins, wear support hose. Elevate your feet for 15 minutes, 3-4 times a day. Limit salt in your diet.  Avoid heavy lifting, wear low heel shoes, and practice good posture.  Rest with your legs elevated if you have leg cramps or low back pain.  Visit your dentist if you have not gone yet during your pregnancy. Use a soft toothbrush to brush your teeth and be gentle when you floss.  A sexual relationship may be continued unless your health care provider directs you otherwise.  Continue to go to all your prenatal visits as directed by your health care provider. SEEK MEDICAL CARE IF:   You have dizziness.  You have mild pelvic cramps, pelvic pressure, or nagging pain in the abdominal area.  You have persistent nausea, vomiting, or diarrhea.  You have a bad smelling vaginal discharge.  You have pain with urination. SEEK IMMEDIATE MEDICAL CARE IF:   You have a fever.  You are leaking fluid from your vagina.  You have  spotting or bleeding from your vagina.  You have severe abdominal cramping or pain.  You have rapid weight gain or loss.  You have shortness of breath with chest pain.  You notice sudden or extreme swelling of your face, hands, ankles, feet, or legs.  You have not felt your baby move in over an hour.  You have severe headaches that do not go away with medicine.  You have vision changes. Document Released: 11/15/2001 Document Revised: 11/26/2013 Document Reviewed: 01/22/2013 [facility] Patient Information 2015 [place], [place]. This information is not intended to replace advice given to you by your health care provider. Make sure you discuss any questions you have with your health care provider.

## 2015-04-01 NOTE — Progress Notes (Signed)
Low-risk OB appointment G1P0 3181w6d Estimated Date of Delivery: 08/20/15 BP 108/50 mmHg  Pulse 68  Wt 227 lb (102.967 kg)  LMP 11/20/2014  BP, weight, and urine reviewed.  Refer to obstetrical flow sheet for FH & FHR.  Reports good fm.  Denies regular uc's, lof, vb, or uti s/s. No complaints. Reviewed today's anatomy u/s- limited views- will recheck at 28wks. Discussed ptl s/s, fm. Plan:  Continue routine obstetrical care  F/U in 4wks for OB appointment  CT POC today

## 2015-04-01 NOTE — Progress Notes (Signed)
US 19+6D measurement c/w dates,cephalic pos,ant pl,sdp of fluid 3.3cm,normal ov's,fht 160 bpm,efw 304g, limited view of heart and face.

## 2015-04-02 LAB — GC/CHLAMYDIA PROBE AMP
Chlamydia trachomatis, NAA: NEGATIVE
Neisseria gonorrhoeae by PCR: NEGATIVE

## 2015-04-13 ENCOUNTER — Telehealth: Payer: Self-pay | Admitting: Adult Health

## 2015-04-14 ENCOUNTER — Encounter: Payer: Self-pay | Admitting: Obstetrics and Gynecology

## 2015-04-14 NOTE — Telephone Encounter (Signed)
Pt given note indicating that we've discussed seat belt use and will monitor going forward. She received a ticket recently due to not having a belt on and has agreed to use it in the future.

## 2015-04-22 ENCOUNTER — Inpatient Hospital Stay (HOSPITAL_COMMUNITY)
Admission: AD | Admit: 2015-04-22 | Discharge: 2015-04-22 | Disposition: A | Discharge status: Home / Self Care | Payer: Medicaid Other | Source: Ambulatory Visit | Attending: Obstetrics & Gynecology | Admitting: Obstetrics & Gynecology

## 2015-04-22 ENCOUNTER — Telehealth: Payer: Self-pay | Admitting: *Deleted

## 2015-04-22 ENCOUNTER — Encounter (HOSPITAL_COMMUNITY): Payer: Self-pay

## 2015-04-22 DIAGNOSIS — Z3A22 22 weeks gestation of pregnancy: Secondary | ICD-10-CM | POA: Diagnosis not present

## 2015-04-22 DIAGNOSIS — A749 Chlamydial infection, unspecified: Secondary | ICD-10-CM

## 2015-04-22 DIAGNOSIS — O9989 Other specified diseases and conditions complicating pregnancy, childbirth and the puerperium: Secondary | ICD-10-CM | POA: Diagnosis not present

## 2015-04-22 DIAGNOSIS — L02419 Cutaneous abscess of limb, unspecified: Secondary | ICD-10-CM | POA: Insufficient documentation

## 2015-04-22 DIAGNOSIS — Z3402 Encounter for supervision of normal first pregnancy, second trimester: Secondary | ICD-10-CM

## 2015-04-22 DIAGNOSIS — Z87891 Personal history of nicotine dependence: Secondary | ICD-10-CM | POA: Insufficient documentation

## 2015-04-22 MED ORDER — CLINDAMYCIN HCL 300 MG PO CAPS: 300.0000 mg | ORAL_CAPSULE | Freq: Three times a day (TID) | ORAL | Status: DC

## 2015-04-22 MED ORDER — LIDOCAINE HCL (PF) 2 % IJ SOLN
10.0000 mL | Freq: Once | INTRAMUSCULAR | Status: DC
  Filled 2015-04-22: qty 10

## 2015-04-22 MED ORDER — OXYCODONE-ACETAMINOPHEN 5-325 MG PO TABS: 1.0000 | ORAL_TABLET | ORAL | Status: DC | PRN

## 2015-04-22 MED ORDER — OXYCODONE-ACETAMINOPHEN 7.5-325 MG PO TABS
1.0000 | ORAL_TABLET | Freq: Once | ORAL | Status: AC
  Administered 2015-04-22: 1 via ORAL
  Filled 2015-04-22: qty 1

## 2015-04-22 NOTE — Discharge Instructions (Signed)
Dressing Change °A dressing is a material placed over wounds. It keeps the wound clean, dry, and protected from further injury.  °BEFORE YOU BEGIN °· Get your supplies together. Things you may need include: °¨ Salt solution (saline). °¨ Flexible gauze bandage. °¨ Medicated cream. °¨ Tape. °¨ Gloves. °¨ Belly (abdominal) pads. °¨ Gauze squares. °¨ Plastic bags. °· Take pain medicine 30 minutes before the bandage change if you need it. °· Take a shower before you do the first bandage change of the day. Put plastic wrap or a bag over the dressing. °REMOVING YOUR OLD BANDAGE °· Wash your hands with soap and water. Dry your hands with a clean towel. °· Put on your gloves. °· Remove any tape. °· Remove the old bandage as told. If it sticks, put a small amount of warm water on it to loosen the bandage. °· Remove any gauze or packing tape in your wound. °· Take off your gloves. °· Put the gloves, tape, gauze, or any packing tape in a plastic bag. °CHANGING YOUR BANDAGE °· Open the supplies. °· Take the cap off the salt solution. °· Open the gauze. Leave the gauze on the inside of the package. °· Put on your gloves. °· Clean your wound as told by your doctor. °· Keep your wound dry if your doctor told you to do so. °· Your doctor may tell you to do one or more of the following: °¨ Pick up the gauze. Pour the salt solution over the gauze. Squeeze out the extra salt solution. °¨ Put medicated cream or other medicine on your wound. °¨ Put solution soaked gauze only in your wound, not on the skin around it. °¨ Pack your wound loosely. °¨ Put dry gauze on your wound. °¨ Put belly pads over the dry gauze if your bandages soak through. °· Tape the bandage in place so it will not fall off. Do not wrap the tape all the way around your arm or leg. °· Wrap the bandage with the flexible gauze bandage as told by your doctor. °· Take off your gloves. Put them in the plastic bag with the old bandage. Tie the bag shut and throw it  away. °· Keep the bandage clean and dry. °· Wash your hands. °GET HELP RIGHT AWAY IF:  °· Your skin around the wound looks red. °· Your wound feels more tender or sore. °· You see yellowish-white fluid (pus) in the wound. °· Your wound smells bad. °· You have a fever. °· Your skin around the wound has a red rash that itches and burns. °· You see black or yellow skin in your wound that was not there before. °· You feel sick to your stomach (nauseous), throw up (vomit), and feel very tired. °Document Released: 02/17/2009 Document Revised: 04/07/2014 Document Reviewed: 10/02/2011 °ExitCare® Patient Information ©2015 [employer]. This information is not intended to replace advice given to you by your health care provider. Make sure you discuss any questions you have with your health care provider. ° °

## 2015-04-22 NOTE — Progress Notes (Signed)
Patient signed consent for I&D.

## 2015-04-22 NOTE — MAU Provider Note (Signed)
History     CSN: [id number]  Arrival date and time: 04/22/15 1512   None     Chief Complaint  Patient presents with  . Recurrent Skin Infections   HPI  Patient is 19 y.o. G1P0 1157w6d here with complaints of abscess on thigh.  Reports noting for past week but now numb and more painful.  Taking tylenol prn pain..  +FM, denies LOF, VB, contractions, vaginal discharge.  OB History    Gravida Para Term Preterm AB TAB SAB Ectopic Multiple Living   1               Past Medical History  Diagnosis Date  . Anemia   . Asthma   . Hidradenitis suppurativa     had excisions in axilla and groin  . Pregnant 12/24/2014  . Hard of hearing     Past Surgical History  Procedure Laterality Date  . Axillary hidradenitis excision    . Inguinal hidradenitis excision    . Tonsillectomy and adenoidectomy      Family History  Problem Relation Age of Onset  . Anemia Mother   . Hypertension Mother   . Other Mother     hidradenitis  . Epilepsy Father   . Other Sister     hidradenitis  . Epilepsy Brother   . Other Paternal Grandmother     aneursym  . Cancer Paternal Grandfather     History  Substance Use Topics  . Smoking status: Former Smoker -- 0.00 packs/day for 0 years  . Smokeless tobacco: Never Used  . Alcohol Use: No    Allergies:  Allergies  Allergen Reactions  . Augmentin [Amoxicillin-Pot Clavulanate] Hives  . Ceclor [Cefaclor] Hives  . Penicillins Hives  . Sulfa Antibiotics Hives    Prescriptions prior to admission  Medication Sig Dispense Refill Last Dose  . acetaminophen (TYLENOL) 325 MG tablet Take 650 mg by mouth every 6 (six) hours as needed.   Taking  . azithromycin (ZITHROMAX) 500 MG tablet Take 2 tablets (1,000 mg total) by mouth daily. (Patient not taking: Reported on 04/01/2015) 2 tablet 1 Not Taking  . diphenhydrAMINE (SOMINEX) 25 MG tablet Take 25 mg by mouth at bedtime as needed for sleep.   Taking  . prenatal vitamin w/FE, FA (PRENATAL 1 + 1) 27-1  MG TABS tablet Take 1 tablet by mouth daily at 12 noon. 30 each 11 Taking    Review of Systems  Constitutional: Negative for fever and chills.  HENT: Negative for congestion.   Respiratory: Negative for cough and shortness of breath.   Cardiovascular: Negative for chest pain and leg swelling.  Gastrointestinal: Negative for heartburn, nausea, vomiting and diarrhea.  Genitourinary: Negative for dysuria, urgency, frequency and hematuria.  Skin: Negative for itching and rash.       wounds  Neurological: Negative for dizziness, loss of consciousness and headaches.   Physical Exam   Blood pressure 114/52, pulse 106, temperature 98.5 F (36.9 C), temperature source Oral, resp. rate 18, height 5\' 1"  (1.549 m), weight 232 lb (105.235 kg), last menstrual period 11/20/2014.  Physical Exam  Constitutional: She is oriented to person, place, and time. She appears well-developed and well-nourished.  HENT:  Head: Normocephalic and atraumatic.  Eyes: Conjunctivae and EOM are normal.  Neck: Normal range of motion.  Cardiovascular: Normal rate.   Respiratory: Effort normal. No respiratory distress.  Musculoskeletal: Normal range of motion. She exhibits no edema.  Neurological: She is alert and oriented to person, place, and time.  Skin: Skin is warm and dry. No erythema.  3x4cm area of fluctuance on medial right thigh, no pointing, no erythema, no induration.    MAU Course  Procedures  MDM  I&D:  Area numbned with 1 cc lidocaine, abscess lanced with scalpel.  Drained gently, then 4cc lidocaine administered.  Loculations broken up.  Wound packed.  Tolerated well  Assessment and Plan  Patient is 19 y.o. G1P0 588w6d reporting abscess likely secondary to hidradenitis suppurativa - fetal kick counts reinforced - preterm labor precautions - rx percocet 5/325mg  #30, bactrim DS BID  Jonnie Truxillo ROCIO, MD 4:17 PM

## 2015-04-22 NOTE — MAU Note (Signed)
Pt. Urine in lab 

## 2015-04-22 NOTE — Telephone Encounter (Signed)
[facility] what was the treatment for + CHL. Informed pt was given Azithromycin 500 mg take two tablets by mouth daily on 02/11/2015.

## 2015-04-22 NOTE — MAU Note (Signed)
Has lump on inside of rt thigh, first noted about a wk ago, now is flaring up pretty.  Hx of recurrent, thinks it either needs to be lanced or she needs an antibiotic

## 2015-04-23 ENCOUNTER — Encounter: Payer: Self-pay | Admitting: Obstetrics & Gynecology

## 2015-04-23 ENCOUNTER — Ambulatory Visit (INDEPENDENT_AMBULATORY_CARE_PROVIDER_SITE_OTHER): Payer: Medicaid Other | Admitting: Obstetrics & Gynecology

## 2015-04-23 ENCOUNTER — Telehealth: Payer: Self-pay | Admitting: Advanced Practice Midwife

## 2015-04-23 VITALS — BP 120/60 | HR 92 | Wt 235.0 lb

## 2015-04-23 DIAGNOSIS — Z331 Pregnant state, incidental: Secondary | ICD-10-CM

## 2015-04-23 DIAGNOSIS — Z1389 Encounter for screening for other disorder: Secondary | ICD-10-CM

## 2015-04-23 DIAGNOSIS — L02429 Furuncle of limb, unspecified: Secondary | ICD-10-CM

## 2015-04-23 LAB — POCT URINALYSIS DIPSTICK
Blood, UA: NEGATIVE
Glucose, UA: NEGATIVE
Ketones, UA: NEGATIVE
Leukocytes, UA: NEGATIVE
Nitrite, UA: NEGATIVE
Protein, UA: NEGATIVE

## 2015-04-23 MED ORDER — SILVER SULFADIAZINE 1 % EX CREA: TOPICAL_CREAM | CUTANEOUS | Status: DC

## 2015-04-23 NOTE — Telephone Encounter (Signed)
Left message x 1. JSY 

## 2015-04-23 NOTE — Progress Notes (Signed)
[employer] in [employer]  Pt with boil I&D yesterday in MAU, right inner thigh On cleocin Local care reviewed with pt Cont cleocin and use q tip + peroxide to keep open Silver nitrate called in as well  Keep scheduled ob appt     Face to face time:  10 minutes  Greater than 50% of the visit time was spent in counseling and coordination of care with the patient.  The summary and outline of the counseling and care coordination is summarized in the note above.   All questions were answered.

## 2015-04-23 NOTE — Telephone Encounter (Signed)
Spoke with pt. Pt feels tight in chest, wheezing x 1 day. Eyes watery. No fever. She was wanting to know if she could use her inhaler. I advised since she was wheezing, she needed to be seen. Pt voiced understanding. Call transferred to front desk for appt. JSY

## 2015-04-27 ENCOUNTER — Other Ambulatory Visit: Payer: Self-pay | Admitting: Women's Health

## 2015-04-27 DIAGNOSIS — Z0489 Encounter for examination and observation for other specified reasons: Secondary | ICD-10-CM

## 2015-04-27 DIAGNOSIS — IMO0002 Reserved for concepts with insufficient information to code with codable children: Secondary | ICD-10-CM

## 2015-04-29 ENCOUNTER — Encounter: Payer: Self-pay | Admitting: Women's Health

## 2015-04-29 ENCOUNTER — Ambulatory Visit (INDEPENDENT_AMBULATORY_CARE_PROVIDER_SITE_OTHER): Payer: Medicaid Other

## 2015-04-29 ENCOUNTER — Ambulatory Visit (INDEPENDENT_AMBULATORY_CARE_PROVIDER_SITE_OTHER): Payer: Medicaid Other | Admitting: Women's Health

## 2015-04-29 VITALS — BP 104/56 | HR 80 | Wt 230.0 lb

## 2015-04-29 DIAGNOSIS — F17200 Nicotine dependence, unspecified, uncomplicated: Secondary | ICD-10-CM | POA: Insufficient documentation

## 2015-04-29 DIAGNOSIS — Z1389 Encounter for screening for other disorder: Secondary | ICD-10-CM

## 2015-04-29 DIAGNOSIS — Z0489 Encounter for examination and observation for other specified reasons: Secondary | ICD-10-CM

## 2015-04-29 DIAGNOSIS — Z331 Pregnant state, incidental: Secondary | ICD-10-CM

## 2015-04-29 DIAGNOSIS — A749 Chlamydial infection, unspecified: Secondary | ICD-10-CM

## 2015-04-29 DIAGNOSIS — Z3402 Encounter for supervision of normal first pregnancy, second trimester: Secondary | ICD-10-CM

## 2015-04-29 DIAGNOSIS — Z0373 Encounter for suspected fetal anomaly ruled out: Secondary | ICD-10-CM | POA: Diagnosis not present

## 2015-04-29 DIAGNOSIS — Z720 Tobacco use: Secondary | ICD-10-CM

## 2015-04-29 DIAGNOSIS — IMO0002 Reserved for concepts with insufficient information to code with codable children: Secondary | ICD-10-CM

## 2015-04-29 DIAGNOSIS — J45909 Unspecified asthma, uncomplicated: Secondary | ICD-10-CM | POA: Insufficient documentation

## 2015-04-29 DIAGNOSIS — J4520 Mild intermittent asthma, uncomplicated: Secondary | ICD-10-CM

## 2015-04-29 LAB — POCT URINALYSIS DIPSTICK
GLUCOSE UA: NEGATIVE
Ketones, UA: NEGATIVE
Leukocytes, UA: NEGATIVE
Nitrite, UA: NEGATIVE
PROTEIN UA: NEGATIVE
RBC UA: NEGATIVE

## 2015-04-29 NOTE — Progress Notes (Signed)
Low-risk OB appointment G1P0 5988w6d Estimated Date of Delivery: 08/20/15 BP 104/56 mmHg  Pulse 80  Wt 230 lb (104.327 kg)  LMP 11/20/2014  BP, weight, and urine reviewed.  Refer to obstetrical flow sheet for FH & FHR.  Reports good fm.  Denies regular uc's, lof, vb, or uti s/s. Cold x 1wk, getting better- cough & congestion. Discussed relief measures- to call if worsens or is just not improving.  Wants to quit smoking. Had quit completely for a few months at beginning of pregnancy, then started back d/t stress, has tried to cut back, is smoking ~5cigs/day now. Discussed risks to herself, fetus during pregnancy and baby after born. Offered quitline [facility]- accepted and referral faxed.  Reviewed today's f/u anatomy u/s- normal. Discussed ptl s/s, fm Plan:  Continue routine obstetrical care  F/U in 4wks for OB appointment and pn2

## 2015-04-29 NOTE — Progress Notes (Signed)
[place] 23+6 measurement c/w dates, cephalic,afi sdp 1.6XW5.2cm, normal ov's bilat, ant pl gr 0, cx 3.4cm,anatomy complete w no obvious abn,fht 161bpm

## 2015-04-29 NOTE — Patient Instructions (Addendum)
You will have your sugar test next visit.  Please do not eat or drink anything after midnight the night before you come, not even water.  You will be here for at least two hours.     Call the office [phone]) or go to [facility] if:  You begin to have strong, frequent contractions  Your water breaks.  Sometimes it is a big gush of fluid, sometimes it is just a trickle that keeps getting your panties wet or running down your legs  You have vaginal bleeding.  It is normal to have a small amount of spotting if your cervix was checked.   You don't feel your baby moving like normal.  If you don't, get you something to eat and drink and lay down and focus on feeling your baby move.   If your baby is still not moving like normal, you should call the office or go to [place] have a viral infection that will resolve on its own over time.  Symptoms typically last 3-7 days but can stretch out to 2-3 weeks.  Unfortunately, antibiotics are not helpful for viral infections.  Humidifier and saline nasal spray for nasal congestion  Regular robitussin, cough drops for cough  Warm salt water gargles for sore throat  Mucinex with lots of water to help you cough up the mucous in your chest if needed  Drink plenty of fluids and stay hydrated!  Wash your hands frequently.  Call if you are not improving by 7-10 days.    Second Trimester of Pregnancy The second trimester is from week 13 through week 28, months 4 through 6. The second trimester is often a time when you feel your best. Your body has also adjusted to being pregnant, and you begin to feel better physically. Usually, morning sickness has lessened or quit completely, you may have more energy, and you may have an increase in appetite. The second trimester is also a time when the fetus is growing rapidly. At the end of the sixth month, the fetus is about 9 inches long and weighs about 1 pounds. You will likely begin to feel the  baby move (quickening) between 18 and 20 weeks of the pregnancy. BODY CHANGES Your body goes through many changes during pregnancy. The changes vary from woman to woman.  12. Your weight will continue to increase. You will notice your lower abdomen bulging out. 13. You may begin to get stretch marks on your hips, abdomen, and breasts. 14. You may develop headaches that can be relieved by medicines approved by your health care provider. 15. You may urinate more often because the fetus is pressing on your bladder. 16. You may develop or continue to have heartburn as a result of your pregnancy. 17. You may develop constipation because certain hormones are causing the muscles that push waste through your intestines to slow down. 18. You may develop hemorrhoids or swollen, bulging veins (varicose veins). 19. You may have back pain because of the weight gain and pregnancy hormones relaxing your joints between the bones in your pelvis and as a result of a shift in weight and the muscles that support your balance. 20. Your breasts will continue to grow and be tender. 21. Your gums may bleed and may be sensitive to brushing and flossing. 22. Dark spots or blotches (chloasma, mask of pregnancy) may develop on your face. This will likely fade after the baby is born. 23. A dark line from your belly button  to the pubic area (linea nigra) may appear. This will likely fade after the baby is born. 24. You may have changes in your hair. These can include thickening of your hair, rapid growth, and changes in texture. Some women also have hair loss during or after pregnancy, or hair that feels dry or thin. Your hair will most likely return to normal after your baby is born. WHAT TO EXPECT AT YOUR PRENATAL VISITS During a routine prenatal visit:  You will be weighed to make sure you and the fetus are growing normally.  Your blood pressure will be taken.  Your abdomen will be measured to track your baby's  growth.  The fetal heartbeat will be listened to.  Any test results from the previous visit will be discussed. Your health care provider may ask you:  How you are feeling.  If you are feeling the baby move.  If you have had any abnormal symptoms, such as leaking fluid, bleeding, severe headaches, or abdominal cramping.  If you have any questions. Other tests that may be performed during your second trimester include:  Blood tests that check for:  Low iron levels (anemia).  Gestational diabetes (between 24 and 28 weeks).  Rh antibodies.  Urine tests to check for infections, diabetes, or protein in the urine.  An ultrasound to confirm the proper growth and development of the baby.  An amniocentesis to check for possible genetic problems.  Fetal screens for spina bifida and Down syndrome. HOME CARE INSTRUCTIONS   Avoid all smoking, herbs, alcohol, and unprescribed drugs. These chemicals affect the formation and growth of the baby.  Follow your health care provider's instructions regarding medicine use. There are medicines that are either safe or unsafe to take during pregnancy.  Exercise only as directed by your health care provider. Experiencing uterine cramps is a good sign to stop exercising.  Continue to eat regular, healthy meals.  Wear a good support bra for breast tenderness.  Do not use hot tubs, steam rooms, or saunas.  Wear your seat belt at all times when driving.  Avoid raw meat, uncooked cheese, cat litter boxes, and soil used by cats. These carry germs that can cause birth defects in the baby.  Take your prenatal vitamins.  Try taking a stool softener (if your health care provider approves) if you develop constipation. Eat more high-fiber foods, such as fresh vegetables or fruit and whole grains. Drink plenty of fluids to keep your urine clear or pale yellow.  Take warm sitz baths to soothe any pain or discomfort caused by hemorrhoids. Use hemorrhoid  cream if your health care provider approves.  If you develop varicose veins, wear support hose. Elevate your feet for 15 minutes, 3-4 times a day. Limit salt in your diet.  Avoid heavy lifting, wear low heel shoes, and practice good posture.  Rest with your legs elevated if you have leg cramps or low back pain.  Visit your dentist if you have not gone yet during your pregnancy. Use a soft toothbrush to brush your teeth and be gentle when you floss.  A sexual relationship may be continued unless your health care provider directs you otherwise.  Continue to go to all your prenatal visits as directed by your health care provider. SEEK MEDICAL CARE IF:   You have dizziness.  You have mild pelvic cramps, pelvic pressure, or nagging pain in the abdominal area.  You have persistent nausea, vomiting, or diarrhea.  You have a bad smelling vaginal discharge.  You have pain with urination. SEEK IMMEDIATE MEDICAL CARE IF:   You have a fever.  You are leaking fluid from your vagina.  You have spotting or bleeding from your vagina.  You have severe abdominal cramping or pain.  You have rapid weight gain or loss.  You have shortness of breath with chest pain.  You notice sudden or extreme swelling of your face, hands, ankles, feet, or legs.  You have not felt your baby move in over an hour.  You have severe headaches that do not go away with medicine.  You have vision changes. Document Released: 11/15/2001 Document Revised: 11/26/2013 Document Reviewed: 01/22/2013 [facility] Patient Information 2015 [place], [place]. This information is not intended to replace advice given to you by your health care provider. Make sure you discuss any questions you have with your health care provider.  Smoking Cessation Quitting smoking is important to your health and has many advantages. However, it is not always easy to quit since nicotine is a very addictive drug. Oftentimes, people try 3 times or  more before being able to quit. This document explains the best ways for you to prepare to quit smoking. Quitting takes hard work and a lot of effort, but you can do it. ADVANTAGES OF QUITTING SMOKING 25. You will live longer, feel better, and live better. 26. Your body will feel the impact of quitting smoking almost immediately. 1. Within 20 minutes, blood pressure decreases. Your pulse returns to its normal level. 2. After 8 hours, carbon monoxide levels in the blood return to normal. Your oxygen level increases. 3. After 24 hours, the chance of having a heart attack starts to decrease. Your breath, hair, and body stop smelling like smoke. 4. After 48 hours, damaged nerve endings begin to recover. Your sense of taste and smell improve. 5. After 72 hours, the body is virtually free of nicotine. Your bronchial tubes relax and breathing becomes easier. 6. After 2 to 12 weeks, lungs can hold more air. Exercise becomes easier and circulation improves. 27. The risk of having a heart attack, stroke, cancer, or lung disease is greatly reduced. 1. After 1 year, the risk of coronary heart disease is cut in half. 2. After 5 years, the risk of stroke falls to the same as a nonsmoker. 3. After 10 years, the risk of lung cancer is cut in half and the risk of other cancers decreases significantly. 4. After 15 years, the risk of coronary heart disease drops, usually to the level of a nonsmoker. 28. If you are pregnant, quitting smoking will improve your chances of having a healthy baby. 29. The people you live with, especially any children, will be healthier. 30. You will have extra money to spend on things other than cigarettes. QUESTIONS TO THINK ABOUT BEFORE ATTEMPTING TO QUIT You may want to talk about your answers with your health care provider.  Why do you want to quit?  If you tried to quit in the past, what helped and what did not?  What will be the most difficult situations for you after you  quit? How will you plan to handle them?  Who can help you through the tough times? Your family? Friends? A health care provider?  What pleasures do you get from smoking? What ways can you still get pleasure if you quit? Here are some questions to ask your health care provider:  How can you help me to be successful at quitting?  What medicine do you think would be  best for me and how should I take it?  What should I do if I need more help?  What is smoking withdrawal like? How can I get information on withdrawal? GET READY  Set a quit date.  Change your environment by getting rid of all cigarettes, ashtrays, matches, and lighters in your home, car, or work. Do not let people smoke in your home.  Review your past attempts to quit. Think about what worked and what did not. GET SUPPORT AND ENCOURAGEMENT You have a better chance of being successful if you have help. You can get support in many ways.  Tell your family, friends, and coworkers that you are going to quit and need their support. Ask them not to smoke around you.  Get individual, group, or telephone counseling and support. Programs are available at [facility] and health centers. Call your local health department for information about programs in your area.  Spiritual beliefs and practices may help some smokers quit.  Download a "quit meter" on your computer to keep track of quit statistics, such as how long you have gone without smoking, cigarettes not smoked, and money saved.  Get a self-help book about quitting smoking and staying off tobacco. LEARN NEW SKILLS AND BEHAVIORS  Distract yourself from urges to smoke. Talk to someone, go for a walk, or occupy your time with a task.  Change your normal routine. Take a different route to work. Drink tea instead of coffee. Eat breakfast in a different place.  Reduce your stress. Take a hot bath, exercise, or read a book.  Plan something enjoyable to do every day. Reward  yourself for not smoking.  Explore interactive web-based programs that specialize in helping you quit. GET MEDICINE AND USE IT CORRECTLY Medicines can help you stop smoking and decrease the urge to smoke. Combining medicine with the above behavioral methods and support can greatly increase your chances of successfully quitting smoking.  Nicotine replacement therapy helps deliver nicotine to your body without the negative effects and risks of smoking. Nicotine replacement therapy includes nicotine gum, lozenges, inhalers, nasal sprays, and skin patches. Some may be available over-the-counter and others require a prescription.  Antidepressant medicine helps people abstain from smoking, but how this works is unknown. This medicine is available by prescription.  Nicotinic receptor partial agonist medicine simulates the effect of nicotine in your brain. This medicine is available by prescription. Ask your health care provider for advice about which medicines to use and how to use them based on your health history. Your health care provider will tell you what side effects to look out for if you choose to be on a medicine or therapy. Carefully read the information on the package. Do not use any other product containing nicotine while using a nicotine replacement product.  RELAPSE OR DIFFICULT SITUATIONS Most relapses occur within the first 3 months after quitting. Do not be discouraged if you start smoking again. Remember, most people try several times before finally quitting. You may have symptoms of withdrawal because your body is used to nicotine. You may crave cigarettes, be irritable, feel very hungry, cough often, get headaches, or have difficulty concentrating. The withdrawal symptoms are only temporary. They are strongest when you first quit, but they will go away within 10-14 days. To reduce the chances of relapse, try to:  Avoid drinking alcohol. Drinking lowers your chances of successfully  quitting.  Reduce the amount of caffeine you consume. Once you quit smoking, the amount of caffeine  in your body increases and can give you symptoms, such as a rapid heartbeat, sweating, and anxiety.  Avoid smokers because they can make you want to smoke.  Do not let weight gain distract you. Many smokers will gain weight when they quit, usually less than 10 pounds. Eat a healthy diet and stay active. You can always lose the weight gained after you quit.  Find ways to improve your mood other than smoking. FOR MORE INFORMATION  www.smokefree.gov  Document Released: 11/15/2001 Document Revised: 04/07/2014 Document Reviewed: 03/01/2012 [facility] Patient Information 2015 [place], [place]. This information is not intended to replace advice given to you by your health care provider. Make sure you discuss any questions you have with your health care provider.

## 2015-04-30 ENCOUNTER — Inpatient Hospital Stay (HOSPITAL_COMMUNITY)
Admission: AD | Admit: 2015-04-30 | Discharge: 2015-04-30 | Disposition: A | Discharge status: Home / Self Care | Payer: Medicaid Other | Source: Ambulatory Visit | Attending: Obstetrics & Gynecology | Admitting: Obstetrics & Gynecology

## 2015-04-30 DIAGNOSIS — R109 Unspecified abdominal pain: Secondary | ICD-10-CM | POA: Diagnosis not present

## 2015-04-30 DIAGNOSIS — R05 Cough: Secondary | ICD-10-CM | POA: Diagnosis not present

## 2015-04-30 DIAGNOSIS — O9989 Other specified diseases and conditions complicating pregnancy, childbirth and the puerperium: Secondary | ICD-10-CM | POA: Diagnosis not present

## 2015-04-30 DIAGNOSIS — R059 Cough, unspecified: Secondary | ICD-10-CM

## 2015-04-30 DIAGNOSIS — Z3A24 24 weeks gestation of pregnancy: Secondary | ICD-10-CM

## 2015-04-30 DIAGNOSIS — F17210 Nicotine dependence, cigarettes, uncomplicated: Secondary | ICD-10-CM | POA: Insufficient documentation

## 2015-04-30 DIAGNOSIS — J45909 Unspecified asthma, uncomplicated: Secondary | ICD-10-CM | POA: Diagnosis not present

## 2015-04-30 DIAGNOSIS — Z882 Allergy status to sulfonamides status: Secondary | ICD-10-CM | POA: Insufficient documentation

## 2015-04-30 DIAGNOSIS — O99512 Diseases of the respiratory system complicating pregnancy, second trimester: Secondary | ICD-10-CM | POA: Diagnosis not present

## 2015-04-30 DIAGNOSIS — O99332 Smoking (tobacco) complicating pregnancy, second trimester: Secondary | ICD-10-CM | POA: Diagnosis not present

## 2015-04-30 DIAGNOSIS — Z881 Allergy status to other antibiotic agents status: Secondary | ICD-10-CM | POA: Diagnosis not present

## 2015-04-30 DIAGNOSIS — Z8249 Family history of ischemic heart disease and other diseases of the circulatory system: Secondary | ICD-10-CM | POA: Insufficient documentation

## 2015-04-30 LAB — URINALYSIS, ROUTINE W REFLEX MICROSCOPIC
Bilirubin Urine: NEGATIVE
Glucose, UA: NEGATIVE mg/dL
Hgb urine dipstick: NEGATIVE
Ketones, ur: 15 mg/dL — AB
Nitrite: NEGATIVE
PROTEIN: NEGATIVE mg/dL
Specific Gravity, Urine: 1.02 (ref 1.005–1.030)
Urobilinogen, UA: 0.2 mg/dL (ref 0.0–1.0)
pH: 7 (ref 5.0–8.0)

## 2015-04-30 LAB — URINE MICROSCOPIC-ADD ON

## 2015-04-30 MED ORDER — BENZONATATE 100 MG PO CAPS: 100.0000 mg | ORAL_CAPSULE | Freq: Three times a day (TID) | ORAL | Status: DC | PRN

## 2015-04-30 NOTE — Progress Notes (Signed)
Readjusted cardio numerous times due to lots of fetal movement.

## 2015-04-30 NOTE — MAU Provider Note (Signed)
History   CSN: [id number]  Arrival date and time: 04/30/15 1819  Chief Complaint  Patient presents with  . Abdominal Pain   Abdominal Pain This is a new problem. The current episode started yesterday. The problem occurs constantly. The problem has been gradually worsening. The pain is located in the left flank. The pain is at a severity of 7/10. The quality of the pain is aching. Pertinent negatives include no constipation, dysuria, fever, headaches, nausea or vomiting. The pain is aggravated by coughing and certain positions. She has tried acetaminophen for the symptoms.   Coughing started about a week ago. Thinks that the coughing has pulled a muscle.   +FM, denies LOF, VB, contractions, vaginal discharge.  OB History    Gravida Para Term Preterm AB TAB SAB Ectopic Multiple Living   1               Past Medical History  Diagnosis Date  . Anemia   . Asthma   . Hidradenitis suppurativa     had excisions in axilla and groin  . Pregnant 12/24/2014  . Hard of hearing     Past Surgical History  Procedure Laterality Date  . Axillary hidradenitis excision    . Inguinal hidradenitis excision    . Tonsillectomy and adenoidectomy      Family History  Problem Relation Age of Onset  . Anemia Mother   . Hypertension Mother   . Other Mother     hidradenitis  . Epilepsy Father   . Other Sister     hidradenitis  . Epilepsy Brother   . Other Paternal Grandmother     aneursym  . Cancer Paternal Grandfather     History  Substance Use Topics  . Smoking status: Current Every Day Smoker -- 0.25 packs/day for 0 years  . Smokeless tobacco: Never Used  . Alcohol Use: No    Allergies:  Allergies  Allergen Reactions  . Augmentin [Amoxicillin-Pot Clavulanate] Hives  . Ceclor [Cefaclor] Hives  . Penicillins Hives  . Sulfa Antibiotics Hives  . Tomato Hives    Prescriptions prior to admission  Medication Sig Dispense Refill Last Dose  . acetaminophen (TYLENOL) 325 MG tablet  Take 650 mg by mouth every 6 (six) hours as needed for mild pain.    Not Taking  . albuterol (ACCUNEB) 1.25 MG/3ML nebulizer solution Take 1 ampule by nebulization every 6 (six) hours as needed for wheezing.   Taking  . azithromycin (ZITHROMAX) 500 MG tablet Take 2 tablets (1,000 mg total) by mouth daily. (Patient not taking: Reported on 04/01/2015) 2 tablet 1 Not Taking  . clindamycin (CLEOCIN) 300 MG capsule Take 1 capsule (300 mg total) by mouth 3 (three) times daily. (Patient not taking: Reported on 04/29/2015) 30 capsule 0 Not Taking  . oxyCODONE-acetaminophen (PERCOCET) 5-325 MG per tablet Take 1 tablet by mouth every 4 (four) hours as needed for severe pain. (Patient not taking: Reported on 04/29/2015) 30 tablet 0 Not Taking  . prenatal vitamin w/FE, FA (PRENATAL 1 + 1) 27-1 MG TABS tablet Take 1 tablet by mouth daily at 12 noon. 30 each 11 Taking  . silver sulfADIAZINE (SILVADENE) 1 % cream Use to area three times a day (Patient not taking: Reported on 04/29/2015) 50 g 11 Not Taking    Review of Systems  Constitutional: Negative for fever and chills.  Eyes: Negative for blurred vision and double vision.  Respiratory: Positive for cough, sputum production and shortness of breath.   Cardiovascular: Negative  for chest pain and leg swelling.  Gastrointestinal: Positive for abdominal pain. Negative for nausea, vomiting and constipation.  Genitourinary: Negative for dysuria.  Neurological: Negative for dizziness, weakness and headaches.   Physical Exam   Blood pressure 152/68, pulse 105, temperature 98.4 F (36.9 C), resp. rate 16, height  (1.549 m), weight 230 lb (104.327 kg), last menstrual period 11/20/2014.  Physical Exam  Constitutional: She is oriented to person, place, and time. She appears well-developed. No distress.  Obese  HENT:  Head: Normocephalic and atraumatic.  Cardiovascular: Normal rate and intact distal pulses.   Respiratory: Effort normal and breath sounds normal.  She has no wheezes. She has no rales.  GI: Soft. There is no tenderness. There is no CVA tenderness.  Musculoskeletal: Normal range of motion.  Neurological: She is alert and oriented to person, place, and time.  Skin: Skin is warm and dry.    MAU Course  Procedures - None  MDM: -UA with some signs of dehydration but no blood or infection -NST reassuring  Assessment and Plan  Patient is 19 y.o. G1P0 [redacted]w[redacted]d reporting abdominal pain likely secondary to muscle strain from excessive coughing. No signs of infection with lung exam normal and afebrile.  - preterm labor precautions -Tessalon rx provided -Tylenol for pain prn  Caryl Ada, DO 04/30/2015, 6:49 PM PGY-1, [facility]

## 2015-04-30 NOTE — Discharge Instructions (Signed)
Upper Respiratory Infection, Adult An upper respiratory infection (URI) is also sometimes known as the common cold. The upper respiratory tract includes the nose, sinuses, throat, trachea, and bronchi. Bronchi are the airways leading to the lungs. Most people improve within 1 week, but symptoms can last up to 2 weeks. A residual cough may last even longer.  CAUSES Many different viruses can infect the tissues lining the upper respiratory tract. The tissues become irritated and inflamed and often become very moist. Mucus production is also common. A cold is contagious. You can easily spread the virus to others by oral contact. This includes kissing, sharing a glass, coughing, or sneezing. Touching your mouth or nose and then touching a surface, which is then touched by another person, can also spread the virus. SYMPTOMS  Symptoms typically develop 1 to 3 days after you come in contact with a cold virus. Symptoms vary from person to person. They may include:  Runny nose.  Sneezing.  Nasal congestion.  Sinus irritation.  Sore throat.  Loss of voice (laryngitis).  Cough.  Fatigue.  Muscle aches.  Loss of appetite.  Headache.  Low-grade fever. DIAGNOSIS  You might diagnose your own cold based on familiar symptoms, since most people get a cold 2 to 3 times a year. Your caregiver can confirm this based on your exam. Most importantly, your caregiver can check that your symptoms are not due to another disease such as strep throat, sinusitis, pneumonia, asthma, or epiglottitis. Blood tests, throat tests, and X-rays are not necessary to diagnose a common cold, but they may sometimes be helpful in excluding other more serious diseases. Your caregiver will decide if any further tests are required. RISKS AND COMPLICATIONS  You may be at risk for a more severe case of the common cold if you smoke cigarettes, have chronic heart disease (such as heart failure) or lung disease (such as asthma), or if  you have a weakened immune system. The very young and very old are also at risk for more serious infections. Bacterial sinusitis, middle ear infections, and bacterial pneumonia can complicate the common cold. The common cold can worsen asthma and chronic obstructive pulmonary disease (COPD). Sometimes, these complications can require emergency medical care and may be life-threatening. PREVENTION  The best way to protect against getting a cold is to practice good hygiene. Avoid oral or hand contact with people with cold symptoms. Wash your hands often if contact occurs. There is no clear evidence that vitamin C, vitamin E, echinacea, or exercise reduces the chance of developing a cold. However, it is always recommended to get plenty of rest and practice good nutrition. TREATMENT  Treatment is directed at relieving symptoms. There is no cure. Antibiotics are not effective, because the infection is caused by a virus, not by bacteria. Treatment may include:  Increased fluid intake. Sports drinks offer valuable electrolytes, sugars, and fluids.  Breathing heated mist or steam (vaporizer or shower).  Eating chicken soup or other clear broths, and maintaining good nutrition.  Getting plenty of rest.  Using gargles or lozenges for comfort.  Controlling fevers with ibuprofen or acetaminophen as directed by your caregiver.  Increasing usage of your inhaler if you have asthma. Zinc gel and zinc lozenges, taken in the first 24 hours of the common cold, can shorten the duration and lessen the severity of symptoms. Pain medicines may help with fever, muscle aches, and throat pain. A variety of non-prescription medicines are available to treat congestion and runny nose. Your caregiver   can make recommendations and may suggest nasal or lung inhalers for other symptoms.  HOME CARE INSTRUCTIONS   Only take over-the-counter or prescription medicines for pain, discomfort, or fever as directed by your  caregiver.  Use a warm mist humidifier or inhale steam from a shower to increase air moisture. This may keep secretions moist and make it easier to breathe.  Drink enough water and fluids to keep your urine clear or pale yellow.  Rest as needed.  Return to work when your temperature has returned to normal or as your caregiver advises. You may need to stay home longer to avoid infecting others. You can also use a face mask and careful hand washing to prevent spread of the virus. SEEK MEDICAL CARE IF:   After the first few days, you feel you are getting worse rather than better.  You need your caregiver's advice about medicines to control symptoms.  You develop chills, worsening shortness of breath, or brown or red sputum. These may be signs of pneumonia.  You develop yellow or brown nasal discharge or pain in the face, especially when you bend forward. These may be signs of sinusitis.  You develop a fever, swollen neck glands, pain with swallowing, or white areas in the back of your throat. These may be signs of strep throat. SEEK IMMEDIATE MEDICAL CARE IF:   You have a fever.  You develop severe or persistent headache, ear pain, sinus pain, or chest pain.  You develop wheezing, a prolonged cough, cough up blood, or have a change in your usual mucus (if you have chronic lung disease).  You develop sore muscles or a stiff neck. Document Released: 05/17/2001 Document Revised: 02/13/2012 Document Reviewed: 02/26/2014 ExitCare Patient Information 2015 [employer]. This information is not intended to replace advice given to you by your health care provider. Make sure you discuss any questions you have with your health care provider.  

## 2015-04-30 NOTE — MAU Note (Signed)
Called for Dr. Doroteo GlassmanPhelps in L&D finishing a delivery requested she call or come back to MAU regarding this patient.

## 2015-04-30 NOTE — MAU Note (Signed)
Patient states has had a cold and cough, having a heaviness and pain in left side which is constant, denies vaginal bleeding no discharge, no burning with urination.

## 2015-05-02 ENCOUNTER — Inpatient Hospital Stay (HOSPITAL_COMMUNITY)
Admission: AD | Admit: 2015-05-02 | Discharge: 2015-05-02 | Disposition: A | Discharge status: Home / Self Care | Payer: Medicaid Other | Source: Ambulatory Visit | Attending: Family Medicine | Admitting: Family Medicine

## 2015-05-02 ENCOUNTER — Inpatient Hospital Stay (HOSPITAL_COMMUNITY): Payer: Medicaid Other

## 2015-05-02 ENCOUNTER — Encounter (HOSPITAL_COMMUNITY): Payer: Self-pay | Admitting: *Deleted

## 2015-05-02 DIAGNOSIS — O99332 Smoking (tobacco) complicating pregnancy, second trimester: Secondary | ICD-10-CM | POA: Diagnosis not present

## 2015-05-02 DIAGNOSIS — Z880 Allergy status to penicillin: Secondary | ICD-10-CM | POA: Insufficient documentation

## 2015-05-02 DIAGNOSIS — J45901 Unspecified asthma with (acute) exacerbation: Secondary | ICD-10-CM

## 2015-05-02 DIAGNOSIS — R05 Cough: Secondary | ICD-10-CM

## 2015-05-02 DIAGNOSIS — M940 Chondrocostal junction syndrome [Tietze]: Secondary | ICD-10-CM | POA: Diagnosis not present

## 2015-05-02 DIAGNOSIS — Z882 Allergy status to sulfonamides status: Secondary | ICD-10-CM | POA: Diagnosis not present

## 2015-05-02 DIAGNOSIS — O99512 Diseases of the respiratory system complicating pregnancy, second trimester: Secondary | ICD-10-CM | POA: Insufficient documentation

## 2015-05-02 DIAGNOSIS — Z8249 Family history of ischemic heart disease and other diseases of the circulatory system: Secondary | ICD-10-CM | POA: Insufficient documentation

## 2015-05-02 DIAGNOSIS — J45909 Unspecified asthma, uncomplicated: Secondary | ICD-10-CM | POA: Diagnosis not present

## 2015-05-02 DIAGNOSIS — R059 Cough, unspecified: Secondary | ICD-10-CM

## 2015-05-02 DIAGNOSIS — O9989 Other specified diseases and conditions complicating pregnancy, childbirth and the puerperium: Secondary | ICD-10-CM | POA: Insufficient documentation

## 2015-05-02 DIAGNOSIS — Z3A24 24 weeks gestation of pregnancy: Secondary | ICD-10-CM | POA: Insufficient documentation

## 2015-05-02 DIAGNOSIS — F17210 Nicotine dependence, cigarettes, uncomplicated: Secondary | ICD-10-CM | POA: Insufficient documentation

## 2015-05-02 DIAGNOSIS — R109 Unspecified abdominal pain: Secondary | ICD-10-CM | POA: Diagnosis present

## 2015-05-02 LAB — URINE MICROSCOPIC-ADD ON

## 2015-05-02 LAB — URINALYSIS, ROUTINE W REFLEX MICROSCOPIC
Bilirubin Urine: NEGATIVE
Glucose, UA: NEGATIVE mg/dL
Hgb urine dipstick: NEGATIVE
KETONES UR: NEGATIVE mg/dL
NITRITE: NEGATIVE
Protein, ur: NEGATIVE mg/dL
Specific Gravity, Urine: >1.03 — ABNORMAL HIGH (ref 1.005–1.030)
UROBILINOGEN UA: 0.2 mg/dL (ref 0.0–1.0)
pH: 5.5 (ref 5.0–8.0)

## 2015-05-02 IMAGING — CR DG CHEST 2V
2 series · 2 of 2 positions shown · non-contrast
Comparison: None.

CLINICAL DATA: Left-sided inspiratory chest pain and shortness of
breath. Twenty-four weeks pregnant. Exam performed with the patient
shielded.

EXAM:
CHEST  2 VIEW

[chest pa]
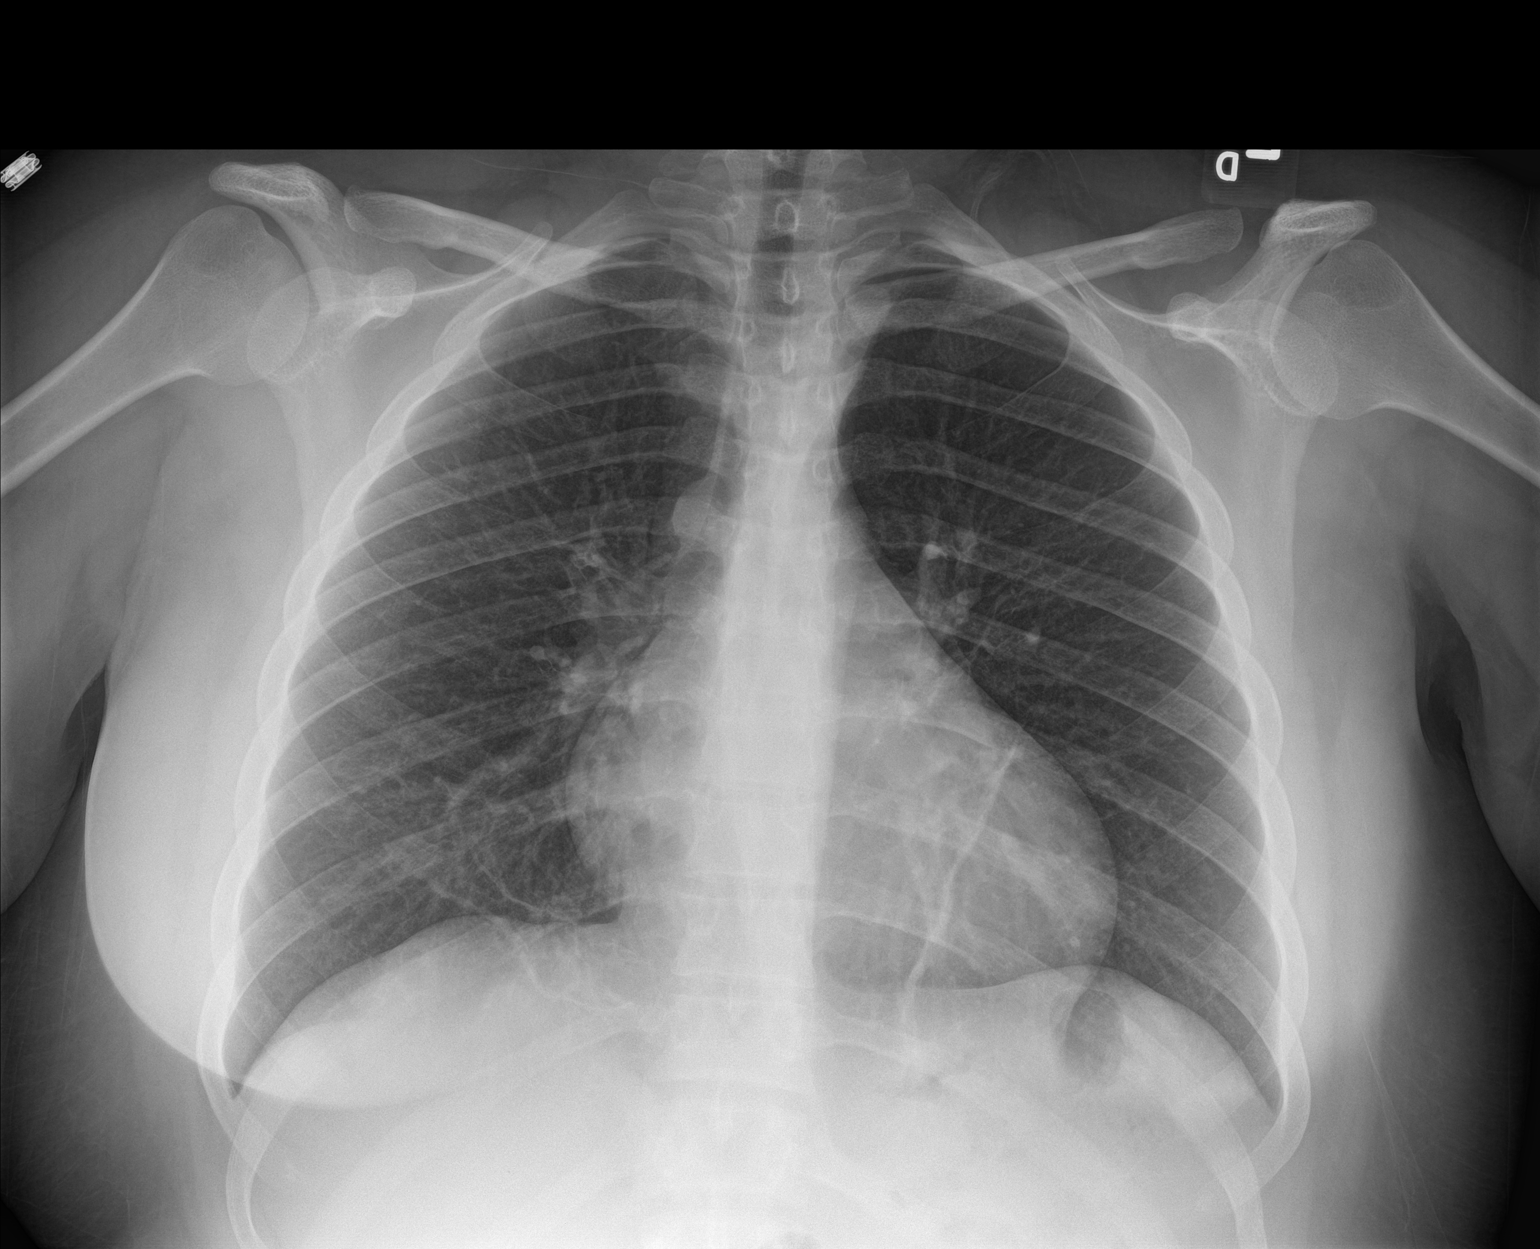

[chest lat]
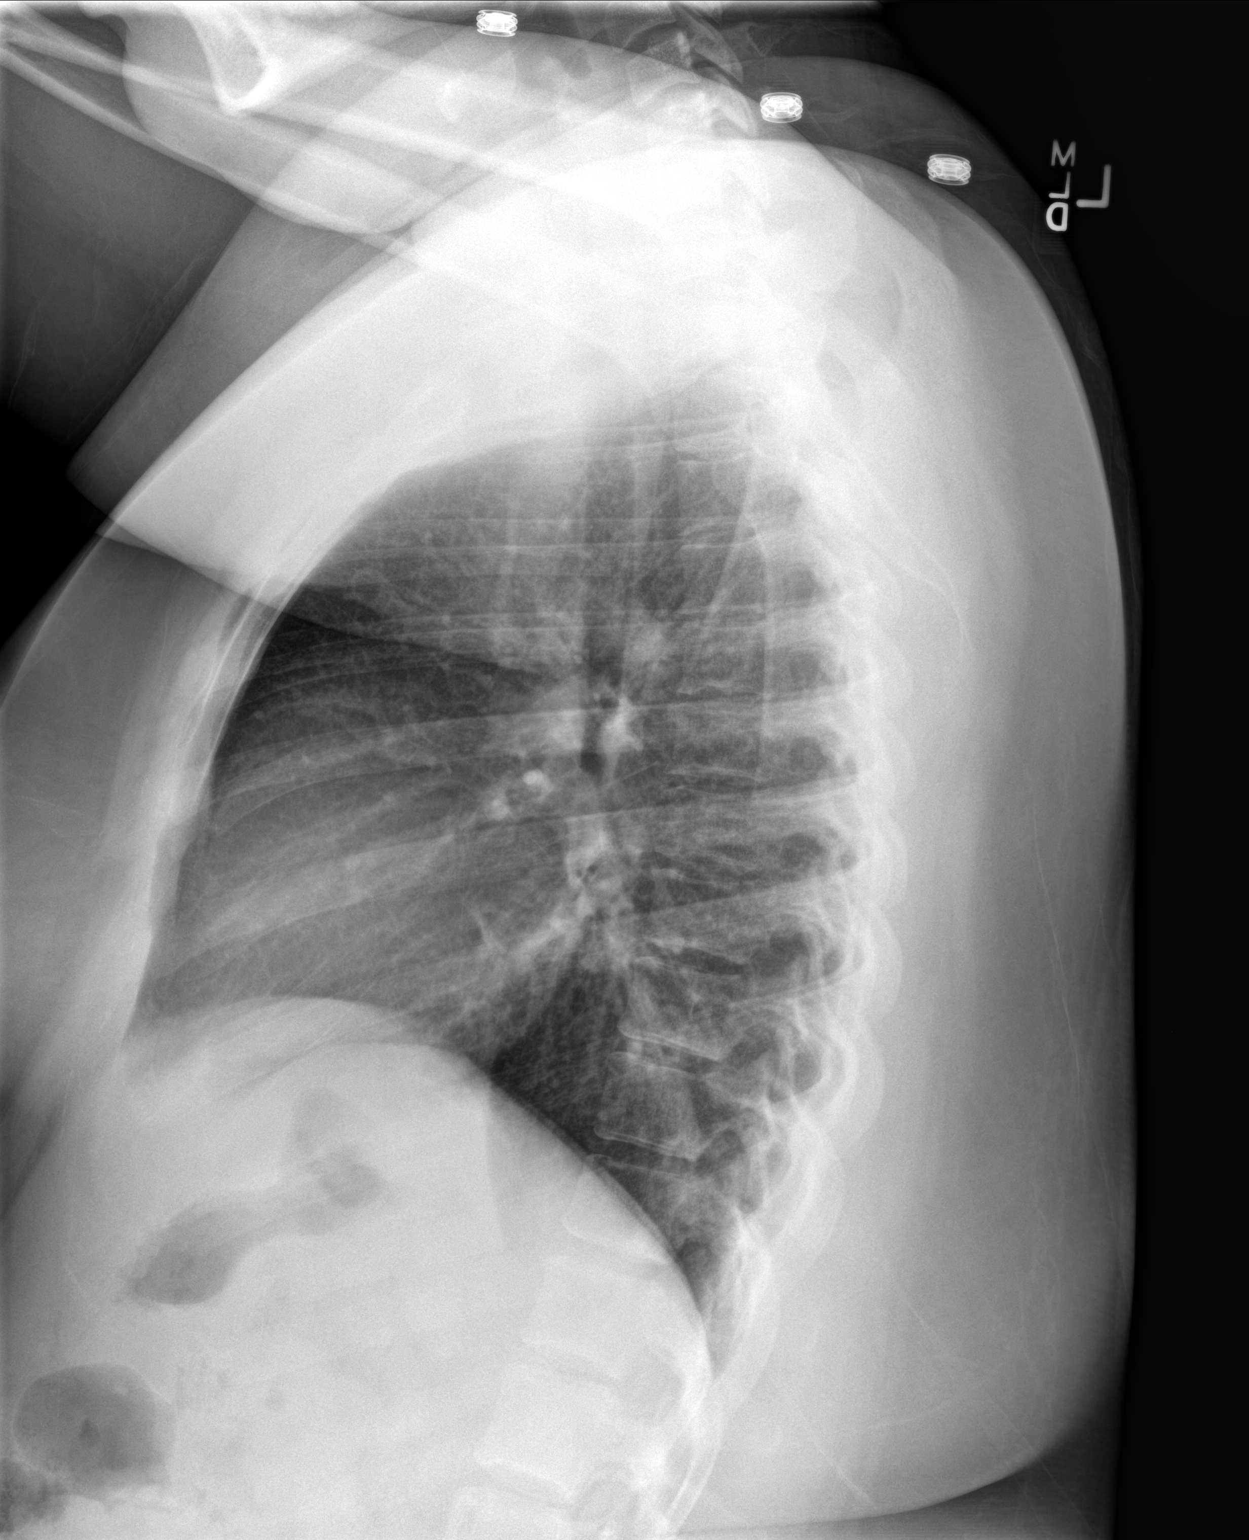

[2 of 2 positions shown; findings below may reference images not displayed]

FINDINGS: The heart size and mediastinal contours are within normal limits.
Both lungs are clear with the exception of thin curvilinear
bilateral lower lobe scarring or atelectasis. The visualized
skeletal structures are unremarkable.
IMPRESSION: Thin curvilinear bilateral lower lobe atelectasis or scarring.

## 2015-05-02 MED ORDER — IBUPROFEN 600 MG PO TABS
600.0000 mg | ORAL_TABLET | Freq: Once | ORAL | Status: AC
  Administered 2015-05-02: 600 mg via ORAL
  Filled 2015-05-02: qty 1

## 2015-05-02 MED ORDER — BECLOMETHASONE DIPROPIONATE 80 MCG/ACT IN AERS: 1.0000 | INHALATION_SPRAY | Freq: Two times a day (BID) | RESPIRATORY_TRACT | Status: DC

## 2015-05-02 MED ORDER — AZITHROMYCIN 250 MG PO TABS: ORAL_TABLET | ORAL | Status: DC

## 2015-05-02 MED ORDER — PREDNISONE 20 MG PO TABS: 40.0000 mg | ORAL_TABLET | Freq: Every day | ORAL | Status: DC

## 2015-05-02 MED ORDER — CYCLOBENZAPRINE HCL 10 MG PO TABS
10.0000 mg | ORAL_TABLET | Freq: Once | ORAL | Status: AC
  Administered 2015-05-02: 10 mg via ORAL
  Filled 2015-05-02: qty 1

## 2015-05-02 NOTE — MAU Provider Note (Signed)
History     CSN: [id number]  Arrival date and time: 05/02/15 4098   First Provider Initiated Contact with Patient 05/02/15 0710      Chief Complaint  Patient presents with  . Flank Pain   HPI    Ms.Loretta Moore is a 19 y.o. female G1P0 who presents with left sided back pain and cough. She was recently seen in MAU for the same symptoms and was told she had a muscle strain from coughing. She has had a URI for greater than one week and she has been coughing a lot. She was prescribed tesslon pearls, however did not pick this up. Yesterday she was coughing very hard and she felt a pop on her left side; near her ribs. When she woke up this morning she could not move because the pain was so intense. She has not taken anything for the pain because she just woke up. Yesterday she tried taking tylenol, however this did not work. The pain in her left side, right near her rib cage. It is constant, and sharp. She currently rates her pain 8/10.  Her pain worsens when she coughs and when she takes a deep breath.   Patient is a smoker. She smokes 5-6 cigarettes per day.   She has a history of asthma and in the last week, due to her cough she has been using her albuterol inhaler twice a day. She has not had to use it yesterday or today because she feels like her symptoms are improving, tho she is still coughing. She has not tried anything over the counter for the cough.   OB History    Gravida Para Term Preterm AB TAB SAB Ectopic Multiple Living   1               Past Medical History  Diagnosis Date  . Anemia   . Asthma   . Hidradenitis suppurativa     had excisions in axilla and groin  . Pregnant 12/24/2014  . Hard of hearing     Past Surgical History  Procedure Laterality Date  . Axillary hidradenitis excision    . Inguinal hidradenitis excision    . Tonsillectomy and adenoidectomy      Family History  Problem Relation Age of Onset  . Anemia Mother   . Hypertension Mother   .  Other Mother     hidradenitis  . Epilepsy Father   . Other Sister     hidradenitis  . Epilepsy Brother   . Other Paternal Grandmother     aneursym  . Cancer Paternal Grandfather     History  Substance Use Topics  . Smoking status: Current Every Day Smoker -- 0.25 packs/day for 0 years  . Smokeless tobacco: Never Used  . Alcohol Use: No    Allergies:  Allergies  Allergen Reactions  . Augmentin [Amoxicillin-Pot Clavulanate] Hives  . Ceclor [Cefaclor] Hives  . Penicillins Hives  . Sulfa Antibiotics Hives  . Tomato Hives    Prescriptions prior to admission  Medication Sig Dispense Refill Last Dose  . acetaminophen (TYLENOL) 325 MG tablet Take 650 mg by mouth every 6 (six) hours as needed for mild pain.    05/01/2015 at Unknown time  . albuterol (PROVENTIL HFA;VENTOLIN HFA) 108 (90 BASE) MCG/ACT inhaler Inhale 2 puffs into the lungs every 6 (six) hours as needed for wheezing or shortness of breath.   Past Week at Unknown time  . albuterol (PROVENTIL) (2.5 MG/3ML) 0.083% nebulizer solution Take 2.5  mg by nebulization every 6 (six) hours as needed for wheezing or shortness of breath.   Past Week at Unknown time  . prenatal vitamin w/FE, FA (PRENATAL 1 + 1) 27-1 MG TABS tablet Take 1 tablet by mouth daily at 12 noon. 30 each 11 Past Month at Unknown time  . benzonatate (TESSALON) 100 MG capsule Take 1 capsule (100 mg total) by mouth 3 (three) times daily as needed for cough. 20 capsule 0    Results for orders placed or performed during the hospital encounter of 05/02/15 (from the past 48 hour(s))  Urinalysis, Routine w reflex microscopic     Status: Abnormal   Collection Time: 05/02/15  6:38 AM  Result Value Ref Range   Color, Urine YELLOW YELLOW   APPearance CLEAR CLEAR   Specific Gravity, Urine >1.030 (H) 1.005 - 1.030   pH 5.5 5.0 - 8.0   Glucose, UA NEGATIVE NEGATIVE mg/dL   Hgb urine dipstick NEGATIVE NEGATIVE   Bilirubin Urine NEGATIVE NEGATIVE   Ketones, ur NEGATIVE  NEGATIVE mg/dL   Protein, ur NEGATIVE NEGATIVE mg/dL   Urobilinogen, UA 0.2 0.0 - 1.0 mg/dL   Nitrite NEGATIVE NEGATIVE   Leukocytes, UA MODERATE (A) NEGATIVE  Urine microscopic-add on     Status: Abnormal   Collection Time: 05/02/15  6:38 AM  Result Value Ref Range   Squamous Epithelial / LPF MANY (A) RARE   WBC, UA 11-20 <3 WBC/hpf   RBC / HPF 0-2 <3 RBC/hpf   Bacteria, UA MANY (A) RARE    Review of Systems  Constitutional: Negative for fever and chills.  Respiratory: Positive for cough and sputum production. Negative for shortness of breath and wheezing.   Genitourinary: Negative for dysuria, urgency, frequency and hematuria.  Musculoskeletal: Positive for back pain.   Physical Exam   Blood pressure 107/64, pulse 109, temperature 98 F (36.7 C), temperature source Oral, resp. rate 16, height 5' (1.524 m), weight 104.872 kg (231 lb 3.2 oz), last menstrual period 11/20/2014.  Physical Exam  Constitutional: She is oriented to person, place, and time. She appears well-developed and well-nourished. She appears distressed.  HENT:  Head: Normocephalic.  Respiratory: Effort normal and breath sounds normal. No respiratory distress. She has no wheezes. She has no rales. She exhibits tenderness.  GI: There is no CVA tenderness.  Musculoskeletal: Normal range of motion.       Arms: Neurological: She is alert and oriented to person, place, and time.  Skin: Skin is warm.  Psychiatric: Her behavior is normal.    MAU Course  Procedures  None   MDM UA Urine culture pending  Ibuprofen 600 mg Flexeril 10 mg Chest xray   Qvar inhaler sent to pharmacy.   Report given to Vonzella Nipple [facility] who resumes care of the patient. Discussed UA results and plan of care.   Duane Lope, NP  Care assumed from Venia Carbon, NP. Patient waiting for radiology  Dg Chest 2 View  05/02/2015   CLINICAL DATA:  Left-sided inspiratory chest pain and shortness of breath. Twenty-four weeks  pregnant. Exam performed with the patient shielded.  EXAM: CHEST  2 VIEW  COMPARISON:  None.  FINDINGS: The heart size and mediastinal contours are within normal limits. Both lungs are clear with the exception of thin curvilinear bilateral lower lobe scarring or atelectasis. The visualized skeletal structures are unremarkable.  IMPRESSION: Thin curvilinear bilateral lower lobe atelectasis or scarring.   Electronically Signed   By: Lucio Edward.D.  On: 05/02/2015 08:26   MDM Discussed patient with Dr. Despina HiddenEure. He recommends Zithromax and Prednisone as well as continued inhaled corticosteroids.   Assessment and Plan  A: SIUP at 3284w2d Costochondritis  P: Discharge home Rx for Prednisone, QVAR and Zithromax sent to patient's pharmacy Warning signs for worsening condition discussed Smoking cessation discussed [facility] Patient may return to MAU as needed or if her condition were to change or worsen

## 2015-05-02 NOTE — Discharge Instructions (Signed)
Costochondritis °Costochondritis is a condition in which the tissue (cartilage) that connects your ribs with your breastbone (sternum) becomes irritated. It causes pain in the chest and rib area. It usually goes away on its own over time. °HOME CARE °· Avoid activities that wear you out. °· Do not strain your ribs. Avoid activities that use your: °¨ Chest. °¨ Belly. °¨ Side muscles. °· Put ice on the area for the first 2 days after the pain starts. °¨ Put ice in a plastic bag. °¨ Place a towel between your skin and the bag. °¨ Leave the ice on for 20 minutes, 2-3 times a day. °· Only take medicine as told by your doctor. °GET HELP IF: °· You have redness or puffiness (swelling) in the rib area. °· Your pain does not go away with rest or medicine. °GET HELP RIGHT AWAY IF:  °· Your pain gets worse. °· You are very uncomfortable. °· You have trouble breathing. °· You cough up blood. °· You start sweating or throwing up (vomiting). °· You have a fever or lasting symptoms for more than 2-3 days. °· You have a fever and your symptoms suddenly get worse. °MAKE SURE YOU:  °· Understand these instructions. °· Will watch your condition. °· Will get help right away if you are not doing well or get worse. °Document Released: 05/09/2008 Document Revised: 07/24/2013 Document Reviewed: 06/25/2013 °ExitCare® Patient Information ©2015 [employer]. This information is not intended to replace advice given to you by your health care provider. Make sure you discuss any questions you have with your health care provider. ° °

## 2015-05-02 NOTE — MAU Note (Signed)
Pain in L side. Was seen MAU feel days ago and told due to coughing. Yesterday coughed hard and felt "pop" in L side. Pain has gotten worse. Denies LOF or bleeding

## 2015-05-02 NOTE — MAU Note (Signed)
J Rasch NP coming to assess patient.

## 2015-05-03 LAB — URINE CULTURE: Colony Count: 60000

## 2015-05-27 ENCOUNTER — Ambulatory Visit (INDEPENDENT_AMBULATORY_CARE_PROVIDER_SITE_OTHER): Payer: Medicaid Other | Admitting: Women's Health

## 2015-05-27 ENCOUNTER — Encounter: Payer: Self-pay | Admitting: Obstetrics and Gynecology

## 2015-05-27 ENCOUNTER — Encounter: Payer: Self-pay | Admitting: Women's Health

## 2015-05-27 ENCOUNTER — Other Ambulatory Visit: Payer: Medicaid Other

## 2015-05-27 VITALS — BP 98/60 | HR 92 | Wt 236.0 lb

## 2015-05-27 DIAGNOSIS — Z331 Pregnant state, incidental: Secondary | ICD-10-CM

## 2015-05-27 DIAGNOSIS — Z131 Encounter for screening for diabetes mellitus: Secondary | ICD-10-CM

## 2015-05-27 DIAGNOSIS — Z1389 Encounter for screening for other disorder: Secondary | ICD-10-CM

## 2015-05-27 DIAGNOSIS — Z369 Encounter for antenatal screening, unspecified: Secondary | ICD-10-CM

## 2015-05-27 DIAGNOSIS — Z3402 Encounter for supervision of normal first pregnancy, second trimester: Secondary | ICD-10-CM

## 2015-05-27 LAB — POCT URINALYSIS DIPSTICK
Glucose, UA: NEGATIVE
Ketones, UA: NEGATIVE
Leukocytes, UA: NEGATIVE
NITRITE UA: NEGATIVE
Protein, UA: NEGATIVE
RBC UA: NEGATIVE

## 2015-05-27 LAB — OB RESULTS CONSOLE HIV ANTIBODY (ROUTINE TESTING): HIV: NONREACTIVE

## 2015-05-27 NOTE — Progress Notes (Signed)
Patient ID: Loretta Moore, female   DOB: [birth year], 19 y.o.   MRN: [id number]  G1P0 [redacted]w[redacted]d Estimated Date of Delivery: 08/20/15  Blood pressure 98/60, pulse 92, weight 236 lb (107.049 kg), last menstrual period 11/20/2014.   refer to the ob flow sheet for FH and FHR, also BP, Wt, Urine results:notable for negative  Patient reports  + good fetal movement, denies any bleeding and no rupture of membranes symptoms or regular contractions.  Patient complaints: None x wt gain Patient has gained ~40 lbs this pregnancy. She reports a history of 13 surgeries of axilla for hidadrenitis.. 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 She has no symptoms at this time and would like to return to working. She denies any known cervical issues during this pregnancy. Classes encouraged FHR: 136 bpm FH: 30 cm  Questions were answered. Assessment: Pregnancy [redacted]w[redacted]d, GDM testing in process                        Excess weight gain Plan:  Continued routine obstetrical care, discussed [facility] childbirth classes with pt and FOB, counseled pt on weight management (including increasing fluid intake)  F/u in 4 weeks for pnx care, resume work as Scientist, physiological at [employer], classes encouraged with pt and partner (TRE)    This chart was SCRIBED for Christin Bach, MD by Ronney Lion, ED Scribe. This patient was seen in room 3 and the patient's care was started at 9:20 AM.  I personally performed the services described in this documentation, which was SCRIBED in my presence. The recorded information has been reviewed and considered accurate. It has been edited as necessary during review. Tilda Burrow, MD

## 2015-05-28 LAB — CBC
HEMATOCRIT: 34.9 % (ref 34.0–46.6)
Hemoglobin: 11.7 g/dL (ref 11.1–15.9)
MCH: 29.4 pg (ref 26.6–33.0)
MCHC: 33.5 g/dL (ref 31.5–35.7)
MCV: 88 fL (ref 79–97)
PLATELETS: 257 10*3/uL (ref 150–379)
RBC: 3.98 x10E6/uL (ref 3.77–5.28)
RDW: 13.6 % (ref 12.3–15.4)
WBC: 7.3 10*3/uL (ref 3.4–10.8)

## 2015-05-28 LAB — ANTIBODY SCREEN: Antibody Screen: NEGATIVE

## 2015-05-28 LAB — HSV 2 ANTIBODY, IGG

## 2015-05-28 LAB — GLUCOSE TOLERANCE, 2 HOURS W/ 1HR
GLUCOSE, 2 HOUR: 52 mg/dL — AB (ref 65–152)
Glucose, 1 hour: 124 mg/dL (ref 65–179)
Glucose, Fasting: 81 mg/dL (ref 65–91)

## 2015-05-28 LAB — RPR: RPR Ser Ql: NONREACTIVE

## 2015-05-28 LAB — HIV ANTIBODY (ROUTINE TESTING W REFLEX): HIV Screen 4th Generation wRfx: NONREACTIVE

## 2015-06-10 ENCOUNTER — Encounter: Payer: Medicaid Other | Admitting: Women's Health

## 2015-06-11 ENCOUNTER — Encounter: Payer: Self-pay | Admitting: Obstetrics and Gynecology

## 2015-06-11 ENCOUNTER — Ambulatory Visit (INDEPENDENT_AMBULATORY_CARE_PROVIDER_SITE_OTHER): Payer: Medicaid Other | Admitting: Obstetrics and Gynecology

## 2015-06-11 VITALS — BP 118/66 | HR 84 | Wt 237.0 lb

## 2015-06-11 DIAGNOSIS — Z331 Pregnant state, incidental: Secondary | ICD-10-CM

## 2015-06-11 DIAGNOSIS — Z3403 Encounter for supervision of normal first pregnancy, third trimester: Secondary | ICD-10-CM

## 2015-06-11 DIAGNOSIS — Z1389 Encounter for screening for other disorder: Secondary | ICD-10-CM

## 2015-06-11 LAB — POCT URINALYSIS DIPSTICK
Glucose, UA: NEGATIVE
Ketones, UA: NEGATIVE
Leukocytes, UA: NEGATIVE
Nitrite, UA: NEGATIVE
Protein, UA: NEGATIVE
RBC UA: NEGATIVE

## 2015-06-11 NOTE — Progress Notes (Signed)
G1P0 5186w0d Estimated Date of Delivery: 08/20/15  Blood pressure 118/66, pulse 84, weight 237 lb (107.502 kg), last menstrual period 11/20/2014.   refer to the ob flow sheet for FH (31cm) and FHR (137), also BP, Wt, Urine results:negative   Patient reports good fetal movement, denies any bleeding and no rupture of membranes symptoms or regular contractions. Patient complaints: No complaints at this time.  Questions were answered. Assessment:  Plan:  Continued routine obstetrical care, encouraged to take childbirth classes   F/u in 2 weeks for routine prenatal care  This chart was scribed by Leone PayorSonum Patel, Medical Scribe, for Dr. Christin BachJohn Ferguson on 06/11/15 at 10:43 AM. I personally performed the services described in this documentation, which was SCRIBED in my presence. The recorded information has been reviewed and considered accurate. It has been edited as necessary during review. Tilda BurrowFERGUSON,JOHN V, MD

## 2015-06-11 NOTE — Progress Notes (Signed)
Pt denies any problems or concerns at this time.  

## 2015-06-18 ENCOUNTER — Encounter (HOSPITAL_COMMUNITY): Payer: Self-pay | Admitting: *Deleted

## 2015-06-18 ENCOUNTER — Inpatient Hospital Stay (HOSPITAL_COMMUNITY)
Admission: AD | Admit: 2015-06-18 | Discharge: 2015-06-18 | Disposition: A | Discharge status: Home / Self Care | Payer: Medicaid Other | Source: Ambulatory Visit | Attending: Obstetrics and Gynecology | Admitting: Obstetrics and Gynecology

## 2015-06-18 DIAGNOSIS — O9989 Other specified diseases and conditions complicating pregnancy, childbirth and the puerperium: Secondary | ICD-10-CM | POA: Insufficient documentation

## 2015-06-18 DIAGNOSIS — O99333 Smoking (tobacco) complicating pregnancy, third trimester: Secondary | ICD-10-CM | POA: Insufficient documentation

## 2015-06-18 DIAGNOSIS — Z880 Allergy status to penicillin: Secondary | ICD-10-CM | POA: Diagnosis not present

## 2015-06-18 DIAGNOSIS — Z3A31 31 weeks gestation of pregnancy: Secondary | ICD-10-CM | POA: Diagnosis not present

## 2015-06-18 DIAGNOSIS — Z789 Other specified health status: Secondary | ICD-10-CM

## 2015-06-18 DIAGNOSIS — F17210 Nicotine dependence, cigarettes, uncomplicated: Secondary | ICD-10-CM | POA: Diagnosis not present

## 2015-06-18 DIAGNOSIS — N898 Other specified noninflammatory disorders of vagina: Secondary | ICD-10-CM | POA: Diagnosis not present

## 2015-06-18 LAB — WET PREP, GENITAL
Clue Cells Wet Prep HPF POC: NONE SEEN
TRICH WET PREP: NONE SEEN
Yeast Wet Prep HPF POC: NONE SEEN

## 2015-06-18 LAB — URINALYSIS, ROUTINE W REFLEX MICROSCOPIC
Bilirubin Urine: NEGATIVE
Glucose, UA: NEGATIVE mg/dL
KETONES UR: 15 mg/dL — AB
Leukocytes, UA: NEGATIVE
NITRITE: NEGATIVE
PH: 5.5 (ref 5.0–8.0)
PROTEIN: NEGATIVE mg/dL
SPECIFIC GRAVITY, URINE: 1.01 (ref 1.005–1.030)
Urobilinogen, UA: 0.2 mg/dL (ref 0.0–1.0)

## 2015-06-18 LAB — URINE MICROSCOPIC-ADD ON

## 2015-06-18 NOTE — Discharge Instructions (Signed)

## 2015-06-18 NOTE — MAU Note (Signed)
Pt presents complaining of loose stool and stomach tightening. Denies pain. Also complains of an increase in discharge. Reports good fetal movement.

## 2015-06-18 NOTE — MAU Provider Note (Signed)
None     Chief Complaint:  Vaginal Discharge   Alleya Demeter is  19 y.o. G1P0 at [redacted]w[redacted]d presents complaining of Vaginal Discharge for a few days..  Denies Itching, irritation, odor.  Also had 3 loose BM's this am, none since.  Has felt stomach "tightening" on the upper right side one time for a minute.    Obstetrical/Gynecological History: OB History    Gravida Para Term Preterm AB TAB SAB Ectopic Multiple Living   1              Past Medical History: Past Medical History  Diagnosis Date  . Anemia   . Asthma   . Hidradenitis suppurativa     had excisions in axilla and groin  . Pregnant 12/24/2014  . Hard of hearing     Past Surgical History: Past Surgical History  Procedure Laterality Date  . Axillary hidradenitis excision    . Inguinal hidradenitis excision    . Tonsillectomy and adenoidectomy      Family History: Family History  Problem Relation Age of Onset  . Anemia Mother   . Hypertension Mother   . Other Mother     hidradenitis  . Epilepsy Father   . Other Sister     hidradenitis  . Epilepsy Brother   . Other Paternal Grandmother     aneursym  . Cancer Paternal Grandfather     Social History: History  Substance Use Topics  . Smoking status: Current Every Day Smoker -- 0.25 packs/day for 3 years    Types: Cigarettes  . Smokeless tobacco: Never Used  . Alcohol Use: No    Allergies:  Allergies  Allergen Reactions  . Augmentin [Amoxicillin-Pot Clavulanate] Hives  . Ceclor [Cefaclor] Hives  . Penicillins Hives  . Sulfa Antibiotics Hives  . Tomato Hives    Meds:  Prescriptions prior to admission  Medication Sig Dispense Refill Last Dose  . calcium carbonate (TUMS - DOSED IN MG ELEMENTAL CALCIUM) 500 MG chewable tablet Chew 1 tablet by mouth daily.   Past Week at Unknown time  . acetaminophen (TYLENOL) 325 MG tablet Take 650 mg by mouth every 6 (six) hours as needed for mild pain.    Not Taking at Unknown time  . albuterol (PROVENTIL  HFA;VENTOLIN HFA) 108 (90 BASE) MCG/ACT inhaler Inhale 2 puffs into the lungs every 6 (six) hours as needed for wheezing or shortness of breath.   Not Taking at Unknown time  . albuterol (PROVENTIL) (2.5 MG/3ML) 0.083% nebulizer solution Take 2.5 mg by nebulization every 6 (six) hours as needed for wheezing or shortness of breath.   Not Taking at Unknown time  . beclomethasone (QVAR) 80 MCG/ACT inhaler Inhale 1 puff into the lungs 2 (two) times daily. (Patient not taking: Reported on 05/27/2015) 1 Inhaler 12 Not Taking  . prenatal vitamin w/FE, FA (PRENATAL 1 + 1) 27-1 MG TABS tablet Take 1 tablet by mouth daily at 12 noon. (Patient not taking: Reported on 05/27/2015) 30 each 11 Not Taking    Review of Systems   Constitutional: Negative for fever and chills Eyes: Negative for visual disturbances Respiratory: Negative for shortness of breath, dyspnea Cardiovascular: Negative for chest pain or palpitations  Gastrointestinal: Negative for vomiting, diarrhea and constipation Genitourinary: Negative for dysuria and urgency Musculoskeletal: Negative for back pain, joint pain, myalgias.  Normal ROM  Neurological: Negative for dizziness and headaches    Physical Exam  Blood pressure 146/65, pulse 98, temperature 98.6 F (37 C), temperature source Oral,  resp. rate 18, last menstrual period 11/20/2014. GENERAL: Well-developed, well-nourished female in no acute distress.  LUNGS: Clear to auscultation bilaterally.  HEART: Regular rate and rhythm. ABDOMEN: Soft, nontender, nondistended, gravid.  EXTREMITIES: Nontender, no edema, 2+ distal pulses. DTR's 2+ PELVIC:  SSE:  Normal appearing dc without odor.  Negative valsalva, fern.  CERVICAL EXAM: Dilatation 0cm   Effacement 0%      FHT:  Baseline rate 145 bpm   Variability moderate  Accelerations present   Decelerations none Contractions: Every 0 mins   Labs: Results for orders placed or performed during the hospital encounter of 06/18/15 (from the  past 24 hour(s))  Urinalysis, Routine w reflex microscopic (not at [facility])   Collection Time: 06/18/15  7:27 PM  Result Value Ref Range   Color, Urine YELLOW YELLOW   APPearance CLEAR CLEAR   Specific Gravity, Urine 1.010 1.005 - 1.030   pH 5.5 5.0 - 8.0   Glucose, UA NEGATIVE NEGATIVE mg/dL   Hgb urine dipstick TRACE (A) NEGATIVE   Bilirubin Urine NEGATIVE NEGATIVE   Ketones, ur 15 (A) NEGATIVE mg/dL   Protein, ur NEGATIVE NEGATIVE mg/dL   Urobilinogen, UA 0.2 0.0 - 1.0 mg/dL   Nitrite NEGATIVE NEGATIVE   Leukocytes, UA NEGATIVE NEGATIVE  Urine microscopic-add on   Collection Time: 06/18/15  7:27 PM  Result Value Ref Range   Squamous Epithelial / LPF FEW (A) RARE   WBC, UA 0-2 <3 WBC/hpf   RBC / HPF 0-2 <3 RBC/hpf   Bacteria, UA RARE RARE  Wet prep, genital   Collection Time: 06/18/15  8:20 PM  Result Value Ref Range   Yeast Wet Prep HPF POC NONE SEEN NONE SEEN   Trich, Wet Prep NONE SEEN NONE SEEN   Clue Cells Wet Prep HPF POC NONE SEEN NONE SEEN   WBC, Wet Prep HPF POC FEW (A) NONE SEEN   Imaging Studies:  No results found.  Assessment: Ellender HoseJaylin Beecham is  19 y.o. G1P0 at 1620w0d presents with Loose BM, resolved; No evidence of ROM; Probably baby movement (causing her to feel as if right side of fundus was "tight""0.  Plan: DC home.   CRESENZO-DISHMAN,Mirielle Byrum 7/14/201610:12 PM

## 2015-06-19 LAB — GC/CHLAMYDIA PROBE AMP (~~LOC~~) NOT AT ARMC
CHLAMYDIA, DNA PROBE: NEGATIVE
NEISSERIA GONORRHEA: NEGATIVE

## 2015-06-25 ENCOUNTER — Encounter: Payer: Medicaid Other | Admitting: Advanced Practice Midwife

## 2015-07-01 ENCOUNTER — Encounter: Payer: Self-pay | Admitting: Advanced Practice Midwife

## 2015-07-01 ENCOUNTER — Ambulatory Visit (INDEPENDENT_AMBULATORY_CARE_PROVIDER_SITE_OTHER): Payer: Medicaid Other | Admitting: Advanced Practice Midwife

## 2015-07-01 VITALS — BP 90/50 | HR 80 | Wt 245.0 lb

## 2015-07-01 DIAGNOSIS — Z3403 Encounter for supervision of normal first pregnancy, third trimester: Secondary | ICD-10-CM

## 2015-07-01 DIAGNOSIS — Z331 Pregnant state, incidental: Secondary | ICD-10-CM

## 2015-07-01 DIAGNOSIS — Z1389 Encounter for screening for other disorder: Secondary | ICD-10-CM

## 2015-07-01 LAB — POCT URINALYSIS DIPSTICK
Blood, UA: NEGATIVE
GLUCOSE UA: NEGATIVE
KETONES UA: NEGATIVE
LEUKOCYTES UA: NEGATIVE
Nitrite, UA: NEGATIVE
PROTEIN UA: NEGATIVE

## 2015-07-01 MED ORDER — PNV PRENATAL PLUS MULTIVITAMIN 27-1 MG PO TABS: 1.0000 | ORAL_TABLET | Freq: Every day | ORAL | Status: DC

## 2015-07-01 NOTE — Progress Notes (Signed)
G1P0 [redacted]w[redacted]d Estimated Date of Delivery: 08/20/15  Blood pressure 90/50, pulse 80, weight 245 lb (111.131 kg), last menstrual period 11/20/2014.   BP weight and urine results all reviewed and noted.  Please refer to the obstetrical flow sheet for the fundal height and fetal heart rate documentation:  Patient reports good fetal movement, denies any bleeding and no rupture of membranes symptoms or regular contractions. Patient is without complaints. All questions were answered.  Plan:  Continued routine obstetrical care,   Follow up in 2 weeks for OB appointment,

## 2015-07-02 ENCOUNTER — Inpatient Hospital Stay (HOSPITAL_COMMUNITY)
Admission: AD | Admit: 2015-07-02 | Discharge: 2015-07-02 | Disposition: A | Discharge status: Home / Self Care | Payer: Medicaid Other | Source: Ambulatory Visit | Attending: Family Medicine | Admitting: Family Medicine

## 2015-07-02 ENCOUNTER — Encounter (HOSPITAL_COMMUNITY): Payer: Self-pay

## 2015-07-02 DIAGNOSIS — O9989 Other specified diseases and conditions complicating pregnancy, childbirth and the puerperium: Secondary | ICD-10-CM | POA: Insufficient documentation

## 2015-07-02 DIAGNOSIS — Z3A33 33 weeks gestation of pregnancy: Secondary | ICD-10-CM | POA: Diagnosis not present

## 2015-07-02 DIAGNOSIS — Z880 Allergy status to penicillin: Secondary | ICD-10-CM | POA: Insufficient documentation

## 2015-07-02 DIAGNOSIS — L02415 Cutaneous abscess of right lower limb: Secondary | ICD-10-CM | POA: Diagnosis present

## 2015-07-02 DIAGNOSIS — A749 Chlamydial infection, unspecified: Secondary | ICD-10-CM

## 2015-07-02 DIAGNOSIS — Z87891 Personal history of nicotine dependence: Secondary | ICD-10-CM | POA: Diagnosis not present

## 2015-07-02 DIAGNOSIS — Z3402 Encounter for supervision of normal first pregnancy, second trimester: Secondary | ICD-10-CM

## 2015-07-02 MED ORDER — CLINDAMYCIN HCL 150 MG PO CAPS: 150.0000 mg | ORAL_CAPSULE | Freq: Four times a day (QID) | ORAL | Status: DC

## 2015-07-02 NOTE — MAU Note (Signed)
Patient has an abscess that in the heat "flares up" and has become increasingly sore, bringing her in today requesting it to be lanced.

## 2015-07-02 NOTE — Discharge Instructions (Signed)
Clindamycin capsules  What is this medicine?  CLINDAMYCIN (KLIN da MYE sin) is a lincosamide antibiotic. It is used to treat certain kinds of bacterial infections. It will not work for colds, flu, or other viral infections.  This medicine may be used for other purposes; ask your health care provider or pharmacist if you have questions.  COMMON BRAND NAME(S): Cleocin  What should I tell my health care provider before I take this medicine?  They need to know if you have any of these conditions:  -kidney disease  -liver disease  -stomach problems like colitis  -an unusual or allergic reaction to clindamycin, lincomycin, or other medicines, foods, dyes like tartrazine or preservatives  -pregnant or trying to get pregnant  -breast-feeding  How should I use this medicine?  Take this medicine by mouth with a full glass of water. Follow the directions on the prescription label. You can take this medicine with food or on an empty stomach. If the medicine upsets your stomach, take it with food. Take your medicine at regular intervals. Do not take your medicine more often than directed. Take all of your medicine as directed even if you think your are better. Do not skip doses or stop your medicine early.  Talk to your pediatrician regarding the use of this medicine in children. Special care may be needed.  Overdosage: If you think you have taken too much of this medicine contact a poison control center or emergency room at once.  NOTE: This medicine is only for you. Do not share this medicine with others.  What if I miss a dose?  If you miss a dose, take it as soon as you can. If it is almost time for your next dose, take only that dose. Do not take double or extra doses.  What may interact with this medicine?  -birth control pills  -chloramphenicol  -erythromycin  -kaolin products  This list may not describe all possible interactions. Give your health care provider a list of all the medicines, herbs, non-prescription drugs,  or dietary supplements you use. Also tell them if you smoke, drink alcohol, or use illegal drugs. Some items may interact with your medicine.  What should I watch for while using this medicine?  Tell your doctor or healthcare professional if your symptoms do not start to get better or if they get worse.  Do not treat diarrhea with over the counter products. Contact your doctor if you have diarrhea that lasts more than 2 days or if it is severe and watery.  What side effects may I notice from receiving this medicine?  Side effects that you should report to your doctor or health care professional as soon as possible:  -allergic reactions like skin rash, itching or hives, swelling of the face, lips, or tongue  -dark urine  -pain on swallowing  -redness, blistering, peeling or loosening of the skin, including inside the mouth  -unusual bleeding or bruising  -unusually weak or tired  -yellowing of eyes or skin  Side effects that usually do not require medical attention (report to your doctor or health care professional if they continue or are bothersome):  -diarrhea  -itching in the rectal or genital area  -joint pain  -nausea, vomiting  -stomach pain  This list may not describe all possible side effects. Call your doctor for medical advice about side effects. You may report side effects to FDA at 1-800-FDA-1088.  Where should I keep my medicine?  Keep out of the   reach of children.  Store at room temperature between 20 and 25 degrees C (68 and 77 degrees F). Throw away any unused medicine after the expiration date.  NOTE: This sheet is a summary. It may not cover all possible information. If you have questions about this medicine, talk to your doctor, pharmacist, or health care provider.   2015, [employer]. (2013-06-27 16:12:32)

## 2015-07-02 NOTE — MAU Provider Note (Signed)
None     Chief Complaint:  hiradenitis  Loretta Moore is  19 y.o. G1P0 at [redacted]w[redacted]d presents complaining of an abscess on right inner thigh. She has had hiradenitis suprativa for 7 years.  She wants to have this one lanced to help relieve the pain. SHe has tried soaking in warm compresses Obstetrical/Gynecological History: OB History    Gravida Para Term Preterm AB TAB SAB Ectopic Multiple Living   1              Past Medical History: Past Medical History  Diagnosis Date  . Anemia   . Asthma   . Hidradenitis suppurativa     had excisions in axilla and groin  . Pregnant 12/24/2014  . Hard of hearing     Past Surgical History: Past Surgical History  Procedure Laterality Date  . Axillary hidradenitis excision    . Inguinal hidradenitis excision    . Tonsillectomy and adenoidectomy      Family History: Family History  Problem Relation Age of Onset  . Anemia Mother   . Hypertension Mother   . Other Mother     hidradenitis  . Epilepsy Father   . Other Sister     hidradenitis  . Epilepsy Brother   . Other Paternal Grandmother     aneursym  . Cancer Paternal Grandfather     Social History: History  Substance Use Topics  . Smoking status: Former Smoker -- 0.25 packs/day for 3 years    Types: Cigarettes    Quit date: 06/02/2015  . Smokeless tobacco: Never Used  . Alcohol Use: No    Allergies:  Allergies  Allergen Reactions  . Augmentin [Amoxicillin-Pot Clavulanate] Hives  . Ceclor [Cefaclor] Hives  . Penicillins Hives  . Sulfa Antibiotics Hives  . Tomato Hives    Meds:  Prescriptions prior to admission  Medication Sig Dispense Refill Last Dose  . acetaminophen (TYLENOL) 325 MG tablet Take 650 mg by mouth every 6 (six) hours as needed for mild pain.    07/01/2015 at Unknown time  . albuterol (PROVENTIL HFA;VENTOLIN HFA) 108 (90 BASE) MCG/ACT inhaler Inhale 2 puffs into the lungs every 6 (six) hours as needed for wheezing or shortness of breath.   rescue  .  calcium carbonate (TUMS - DOSED IN MG ELEMENTAL CALCIUM) 500 MG chewable tablet Chew 2 tablets by mouth 4 (four) times daily as needed.    Past Month at Unknown time  . albuterol (PROVENTIL) (2.5 MG/3ML) 0.083% nebulizer solution Take 2.5 mg by nebulization every 6 (six) hours as needed for wheezing or shortness of breath.   [facility]  . Prenatal Vit-Fe Fumarate-FA (PNV PRENATAL PLUS MULTIVITAMIN) 27-1 MG TABS Take 1 tablet by mouth daily. (Patient not taking: Reported on 07/02/2015) 30 tablet 11 Not Taking at Unknown time    Review of Systems   Constitutional: Negative for fever and chills Eyes: Negative for visual disturbances Respiratory: Negative for shortness of breath, dyspnea Cardiovascular: Negative for chest pain or palpitations  Gastrointestinal: Negative for vomiting, diarrhea and constipation Genitourinary: Negative for dysuria and urgency Musculoskeletal: Negative for back pain, joint pain, myalgias.  Normal ROM  Neurological: Negative for dizziness and headaches    Physical Exam  Blood pressure 134/70, pulse 109, temperature 98.2 F (36.8 C), temperature source Oral, resp. rate 20, height 5' (1.524 m), weight 107.502 kg (237 lb), last menstrual period 11/20/2014. GENERAL: Well-developed, well-nourished female in no acute distress.  LUNGS: Clear to auscultation bilaterally.  HEART: Regular rate and rhythm.  ABDOMEN: Soft, nontender, nondistended, gravid.  EXTREMITIES: Nontender, no edema, 2+ distal pulses. 2cm firm abscess right inner thigh.  No erythema/drainage.  Not ready to be lanced.  Co exam with Dr. Shawnie Pons DTR's 2+ FHT:  Baseline rate 145 bpm   Variability moderate  Accelerations present   Decelerations none Contractions: Every 0 mins   Labs: No results found for this or any previous visit (from the past 24 hour(s)). Imaging Studies:  No results found.  Assessment: Loretta Moore is  19 y.o. G1P0 at [redacted]w[redacted]d presents with hiradentitis suppurataiva abscess, not ready to  be lanced.   Plan: Clindamycin  QID X 7 Continue soaks F/U if softens and is still painful  CRESENZO-DISHMAN,Lauriana Denes 7/28/201610:37 PM

## 2015-07-05 ENCOUNTER — Inpatient Hospital Stay (HOSPITAL_COMMUNITY)
Admission: AD | Admit: 2015-07-05 | Discharge: 2015-07-05 | Disposition: A | Discharge status: Home / Self Care | Payer: Medicaid Other | Source: Ambulatory Visit | Attending: Obstetrics & Gynecology | Admitting: Obstetrics & Gynecology

## 2015-07-05 DIAGNOSIS — Z880 Allergy status to penicillin: Secondary | ICD-10-CM | POA: Diagnosis not present

## 2015-07-05 DIAGNOSIS — Z87891 Personal history of nicotine dependence: Secondary | ICD-10-CM | POA: Insufficient documentation

## 2015-07-05 DIAGNOSIS — K219 Gastro-esophageal reflux disease without esophagitis: Secondary | ICD-10-CM | POA: Insufficient documentation

## 2015-07-05 DIAGNOSIS — L02415 Cutaneous abscess of right lower limb: Secondary | ICD-10-CM | POA: Diagnosis present

## 2015-07-05 DIAGNOSIS — Z3A33 33 weeks gestation of pregnancy: Secondary | ICD-10-CM | POA: Insufficient documentation

## 2015-07-05 DIAGNOSIS — O99613 Diseases of the digestive system complicating pregnancy, third trimester: Secondary | ICD-10-CM | POA: Diagnosis not present

## 2015-07-05 DIAGNOSIS — O9989 Other specified diseases and conditions complicating pregnancy, childbirth and the puerperium: Secondary | ICD-10-CM | POA: Diagnosis not present

## 2015-07-05 MED ORDER — CLINDAMYCIN HCL 300 MG PO CAPS: 300.0000 mg | ORAL_CAPSULE | Freq: Three times a day (TID) | ORAL | Status: DC

## 2015-07-05 MED ORDER — CLINDAMYCIN HCL 300 MG PO CAPS
300.0000 mg | ORAL_CAPSULE | Freq: Once | ORAL | Status: AC
  Administered 2015-07-05: 300 mg via ORAL
  Filled 2015-07-05: qty 1

## 2015-07-05 MED ORDER — OXYCODONE-ACETAMINOPHEN 5-325 MG PO TABS
2.0000 | ORAL_TABLET | Freq: Once | ORAL | Status: AC
  Administered 2015-07-05: 2 via ORAL
  Filled 2015-07-05: qty 2

## 2015-07-05 MED ORDER — ONDANSETRON 4 MG PO TBDP
4.0000 mg | ORAL_TABLET | Freq: Once | ORAL | Status: AC
  Administered 2015-07-05: 4 mg via ORAL
  Filled 2015-07-05: qty 1

## 2015-07-05 MED ORDER — FAMOTIDINE 20 MG PO TABS
20.0000 mg | ORAL_TABLET | Freq: Once | ORAL | Status: AC
  Administered 2015-07-05: 20 mg via ORAL
  Filled 2015-07-05: qty 1

## 2015-07-05 MED ORDER — TRAMADOL-ACETAMINOPHEN 37.5-325 MG PO TABS: 1.0000 | ORAL_TABLET | Freq: Four times a day (QID) | ORAL | Status: DC | PRN

## 2015-07-05 MED ORDER — FAMOTIDINE 40 MG PO TABS: 40.0000 mg | ORAL_TABLET | Freq: Every day | ORAL | Status: DC

## 2015-07-05 NOTE — MAU Note (Signed)
Notified Zerita Boers CNM and was referred to Dr. Debroah Loop to see patient.  Dr. Debroah Loop will see patient in MAU.

## 2015-07-05 NOTE — Discharge Instructions (Signed)
Hidradenitis Suppurativa, Sweat Gland Abscess Hidradenitis suppurativa is a long lasting (chronic), uncommon disease of the sweat glands. With this, boil-like lumps and scarring develop in the groin, some times under the arms (axillae), and under the breasts. It may also uncommonly occur behind the ears, in the crease of the buttocks, and around the genitals.  CAUSES  The cause is from a blocking of the sweat glands. They then become infected. It may cause drainage and odor. It is not contagious. So it cannot be given to someone else. It most often shows up in puberty (about 10 to 21 years of age). But it may happen much later. It is similar to acne which is a disease of the sweat glands. This condition is slightly more common in African-Americans and women. SYMPTOMS   Hidradenitis usually starts as one or more red, tender, swellings in the groin or under the arms (axilla).  Over a period of hours to days the lesions get larger. They often open to the skin surface, draining clear to yellow-colored fluid.  The infected area heals with scarring. DIAGNOSIS  Your caregiver makes this diagnosis by looking at you. Sometimes cultures (growing germs on plates in the lab) may be taken. This is to see what germ (bacterium) is causing the infection.  TREATMENT   Topical germ killing medicine applied to the skin (antibiotics) are the treatment of choice. Antibiotics taken by mouth (systemic) are sometimes needed when the condition is getting worse or is severe.  Avoid tight-fitting clothing which traps moisture in.  Dirt does not cause hidradenitis and it is not caused by poor hygiene.  Involved areas should be cleaned daily using an antibacterial soap. Some patients find that the liquid form of Lever 2000, applied to the involved areas as a lotion after bathing, can help reduce the odor related to this condition.  Sometimes surgery is needed to drain infected areas or remove scarred tissue. Removal of  large amounts of tissue is used only in severe cases.  Birth control pills may be helpful.  Oral retinoids (vitamin A derivatives) for 6 to 12 months which are effective for acne may also help this condition.  Weight loss will improve but not cure hidradenitis. It is made worse by being overweight. But the condition is not caused by being overweight.  This condition is more common in people who have had acne.  It may become worse under stress. There is no medical cure for hidradenitis. It can be controlled, but not cured. The condition usually continues for years with periods of getting worse and getting better (remission). Document Released: 07/05/2004 Document Revised: 02/13/2012 Document Reviewed: 02/21/2014 Exit[employer] Patient Information 2015 [employer]. This information is not intended to replace advice given to you by your health care provider. Make sure you discuss any questions you have with your health care provider.  

## 2015-07-05 NOTE — MAU Note (Signed)
Having nausea as well has not been able to eat anything, rates pain 10/10

## 2015-07-05 NOTE — MAU Provider Note (Signed)
History     CSN: [id number]  Arrival date and time: 07/05/15 1511   First Provider Initiated Contact with Patient 07/05/15 1611      Chief Complaint  Patient presents with  . Recurrent Skin Infections  pain, wants to have boil lanced HPI G1P0 [redacted]w[redacted]d Loretta Moore is 19 y.o. G1P0 at [redacted]w[redacted]d presents complaining of an abscess on right inner thigh and pain that worsened since she was seen 3 days ago. She has had hiradenitis suprativa for 7 years. She wants to have this one lanced to help relieve the pain. SHe has tried soaking in warm compresses. She did not take the oral cleocin prescribed on 7/28, used topical.    Past Medical History  Diagnosis Date  . Anemia   . Asthma   . Hidradenitis suppurativa     had excisions in axilla and groin  . Pregnant 12/24/2014  . Hard of hearing     Past Surgical History  Procedure Laterality Date  . Axillary hidradenitis excision    . Inguinal hidradenitis excision    . Tonsillectomy and adenoidectomy      Family History  Problem Relation Age of Onset  . Anemia Mother   . Hypertension Mother   . Other Mother     hidradenitis  . Epilepsy Father   . Other Sister     hidradenitis  . Epilepsy Brother   . Other Paternal Grandmother     aneursym  . Cancer Paternal Grandfather     History  Substance Use Topics  . Smoking status: Former Smoker -- 0.25 packs/day for 3 years    Types: Cigarettes    Quit date: 06/02/2015  . Smokeless tobacco: Never Used  . Alcohol Use: No    Allergies:  Allergies  Allergen Reactions  . Augmentin [Amoxicillin-Pot Clavulanate] Hives  . Ceclor [Cefaclor] Hives  . Penicillins Hives  . Sulfa Antibiotics Hives  . Tomato Hives    Prescriptions prior to admission  Medication Sig Dispense Refill Last Dose  . acetaminophen (TYLENOL) 500 MG tablet Take 1,000 mg by mouth every 6 (six) hours as needed for mild pain or headache.   07/04/2015 at 1900  . albuterol (PROVENTIL HFA;VENTOLIN HFA) 108 (90  BASE) MCG/ACT inhaler Inhale 2 puffs into the lungs every 6 (six) hours as needed for wheezing or shortness of breath.   PRN at PRN  . albuterol (PROVENTIL) (2.5 MG/3ML) 0.083% nebulizer solution Take 2.5 mg by nebulization every 6 (six) hours as needed for wheezing or shortness of breath.   PRN at PRN  . calcium carbonate (TUMS - DOSED IN MG ELEMENTAL CALCIUM) 500 MG chewable tablet Chew 2 tablets by mouth 4 (four) times daily as needed for indigestion or heartburn.    Past Week at Unknown time  . clindamycin (CLEOCIN T) 1 % external solution Apply 1 application topically at bedtime.   07/04/2015 at Unknown time  . Prenatal Vit-Fe Fumarate-FA (PNV PRENATAL PLUS MULTIVITAMIN) 27-1 MG TABS Take 1 tablet by mouth daily. 30 tablet 11 07/05/2015 at Unknown time  . clindamycin (CLEOCIN) 150 MG capsule Take 1 capsule (150 mg total) by mouth every 6 (six) hours. (Patient not taking: Reported on 07/05/2015) 28 capsule 0     Review of Systems  Respiratory: Negative.   Gastrointestinal: Positive for heartburn and nausea.  Genitourinary: Negative.   Skin:       Painful boil right thigh   Physical Exam   Blood pressure 153/70, pulse 93, temperature 98.3 F (36.8 C), resp.  rate 16, last menstrual period 11/20/2014.  Physical Exam  Constitutional: She is oriented to person, place, and time. She appears well-developed. She appears distressed (mild discomfort).  Cardiovascular: Normal rate.   Respiratory: Effort normal. No respiratory distress.  Neurological: She is alert and oriented to person, place, and time.  Skin: Skin is warm and dry.  Boil right inner thigh not red, mod tender, not pointing  Psychiatric:  Mildly anxious    MAU Course  Procedures  MDM 15 min with patient face to face and coordination of care  Assessment and Plan   Past Medical History  Diagnosis Date  . Anemia   . Asthma   . Hidradenitis suppurativa     had excisions in axilla and groin  . Pregnant 12/24/2014  . Hard  of hearing   Pain form skin infection, explained that I&D no recommended management for this condition, need antibiotic as rx and pain management, soaks.  Zofran for nausea, start pepcid for reflux and nausea Percocet now for pain and Ultracet for discharge F/u at [facility]  Dabid [place] 07/05/2015, 4:15 PM

## 2015-07-05 NOTE — MAU Note (Signed)
Patient was seen on Thursday for abscess on inner right thigh back today states has been soaking it not helping.

## 2015-07-15 ENCOUNTER — Ambulatory Visit (INDEPENDENT_AMBULATORY_CARE_PROVIDER_SITE_OTHER): Payer: Medicaid Other | Admitting: Advanced Practice Midwife

## 2015-07-15 ENCOUNTER — Encounter: Payer: Self-pay | Admitting: Advanced Practice Midwife

## 2015-07-15 VITALS — BP 112/66 | HR 100 | Wt 243.0 lb

## 2015-07-15 DIAGNOSIS — Z1389 Encounter for screening for other disorder: Secondary | ICD-10-CM

## 2015-07-15 DIAGNOSIS — Z331 Pregnant state, incidental: Secondary | ICD-10-CM

## 2015-07-15 DIAGNOSIS — Z3403 Encounter for supervision of normal first pregnancy, third trimester: Secondary | ICD-10-CM

## 2015-07-15 LAB — POCT URINALYSIS DIPSTICK
Blood, UA: NEGATIVE
Glucose, UA: NEGATIVE
Ketones, UA: NEGATIVE
LEUKOCYTES UA: NEGATIVE
Nitrite, UA: NEGATIVE

## 2015-07-15 NOTE — Progress Notes (Signed)
G1P0 [redacted]w[redacted]d Estimated Date of Delivery: 08/20/15  Blood pressure 112/66, pulse 100, weight 243 lb (110.224 kg), last menstrual period 11/20/2014.   BP weight and urine results all reviewed and noted.  Please refer to the obstetrical flow sheet for the fundal height and fetal heart rate documentation:  Patient reports good fetal movement, denies any bleeding and no rupture of membranes symptoms or regular contractions. Patient is without complaints. All questions were answered.boil on thing finally opened up and she feels better.   Plan:  Continued routine obstetrical care  Follow up in 2 weeks for OB appointment,

## 2015-07-15 NOTE — Patient Instructions (Addendum)
Carpal Tunnel Syndrome °Carpal tunnel syndrome is a disorder of the nervous system in the wrist that causes pain, hand weakness, and/or loss of feeling. Carpal tunnel syndrome is caused by the compression, stretching, or irritation of the median nerve at the wrist joint. Athletes who experience carpal tunnel syndrome may notice a decrease in their performance to the condition, especially for sports that require strong hand or wrist action.  °SYMPTOMS  °· Tingling, numbness, or burning pain in the hand or fingers. °· Inability to sleep due to pain in the hand. °· Sharp pains that shoot from the wrist up the arm or to the fingers, especially at night. °· Morning stiffness or cramping of the hand. °· Thumb weakness, resulting in difficulty holding objects or making a fist. °· Shiny, dry skin on the hand. °· Reduced performance in any sport requiring a strong grip. °CAUSES  °· Median nerve damage at the wrist is caused by pressure due to swelling, inflammation, or scarred tissue. °· Sources of pressure include: °¨ Repetitive gripping or squeezing that causes inflammation of the tendon sheaths. °¨ Scarring or shortening of the ligament that covers the median nerve. °¨ Traumatic injury to the wrist or forearm such as fracture, sprain, or dislocation. °¨ Prolonged hyperextension (wrist bent backward) or hyperflexion (wrist bent downward) of the wrist. °RISK INCREASES WITH: °· Diabetes mellitus. °· Menopause or amenorrhea. °· Rheumatoid arthritis. °· Raynaud disease. °· Pregnancy. °· Gout. °· Kidney disease. °· Ganglion cyst. °· Repetitive hand or wrist action. °· Hypothyroidism (underactive thyroid gland). °· Repetitive jolting or shaking of the hands or wrist. °· Prolonged forceful weight-bearing on the hands. °PREVENTION °· Bracing the hand and wrist straight during activities that involve repetitive grasping. °· For activities that require prolonged extension of the wrist (bending towards the top of the forearm)  periodically change the position of your wrists. °· Learn and use proper technique in activities that result in the wrist position in neutral to slight extension. °· Avoid bending the wrist into full extension or flexion (up or down). °· Keep the wrist in a straight (neutral) position. To keep the wrist in this position, wear a splint. °· Avoid repetitive hand and wrist motions. °· When possible avoid prolonged grasping of items (steering wheel of a car, a pen, a vacuum cleaner, or a rake). °· Loosen your grip for activities that require prolonged grasping of items. °· Place keyboards and writing surfaces at the correct height as to decrease strain on the wrist and hand. °· Alternate work tasks to avoid prolonged wrist flexion. °· Avoid pinching activities (needlework and writing) as they may irritate your carpal tunnel syndrome. °· If these activities are necessary, complete them for shorter periods of time. °· When writing, use a felt tip or rollerball pen and/or build up the grip on a pen to decrease the forces required for writing. °PROGNOSIS  °Carpal tunnel syndrome is usually curable with appropriate conservative treatment and sometimes resolves spontaneously. For some cases, surgery is necessary, especially if muscle wasting or nerve changes have developed.  °RELATED COMPLICATIONS  °· Permanent numbness and a weak thumb or fingers in the affected hand. °· Permanent paralysis of a portion of the hand and fingers. °TREATMENT  °Treatment initially consists of stopping activities that aggravate the symptoms as well as medication and ice to reduce inflammation. A wrist splint is often recommended for wear during activities of repetitive motion as well as at night. It is also important to learn and use proper technique when   performing activities that typically cause pain. On occasion, a corticosteroid injection may be given. If symptoms persist despite conservative treatment, surgery may be an option. Surgical  techniques free the pinched or compressed nerve. Carpal tunnel surgery is usually performed on an outpatient basis, meaning you go home the same day as surgery. These procedures provide almost complete relief of all symptoms in 95% of patients. Expect at least 2 weeks for healing after surgery. For cases that are the result of repeated jolting or shaking of the hand or wrist or prolonged hyperextension, surgery is not usually recommended because stretching of the median nerve, not compression, is usually the cause of carpal tunnel syndrome in these cases. MEDICATION   If pain medication is necessary, nonsteroidal anti-inflammatory medications, such as aspirin and ibuprofen, or other minor pain relievers, such as acetaminophen, are often recommended.  Do not take pain medication for 7 days before surgery.  Prescription pain relievers are usually only prescribed after surgery. Use only as directed and only as much as you need.  Corticosteroid injections may be given to reduce inflammation. However, they are not always recommended.  Vitamin B6 (pyridoxine) may reduce symptoms; use only if prescribed for your disorder. SEEK MEDICAL CARE IF:   Symptoms get worse or do not improve in 2 weeks despite treatment.  You also have a current or recent history of neck or shoulder injury that has resulted in pain or tingling elsewhere in your arm. Document Released: 11/21/2005 Document Revised: 04/07/2014 Document Reviewed: 03/05/2009 [facility] Patient Information 2015 [place], [place]. This information is not intended to replace advice given to you by your health care provider. Make sure you discuss any questions you have with your health care provider.   AM I IN LABOR? What is labor? Labor is the work that your body does to birth your baby. Your uterus (the womb) contracts. Your cervix (the mouth of the uterus) opens. You will push your baby out into the world.  What do contractions (labor pains) feel  like? When they first start, contractions usually feel like cramps during your period. Sometimes you feel pain in your back. Most often, contractions feel like muscles pulling painfully in your lower belly. At first, the contractions will probably be 15 to 20 minutes apart. They will not feel too painful. As labor goes on, the contractions get stronger, closer together, and more painful.  How do I time the contractions? Time your contractions by counting the number of minutes from the start of one contraction to the start of the next contraction.  What should I do when the contractions start? If it is night and you can sleep, sleep. If it happens during the day, here are some things you can do to take care of yourself at home: ? Walk. If the pains you are having are real labor, walking will make the contractions come faster and harder. If the contractions are not going to continue and be real labor, walking will make the contractions slow down. ? Take a shower or bath. This will help you relax. ? Eat. Labor is a big event. It takes a lot of energy. ? Drink water. Not drinking enough water can cause false labor (contractions that hurt but do not open your cervix). If this is true labor, drinking water will help you have strength to get through your labor. ? Take a nap. Get all the rest you can. ? Get a massage. If your labor is in your back, a strong massage on your  lower back may feel very good. Getting a foot massage is always good. ? Don't panic. You can do this. Your body was made for this. You are strong!  When should I go to the hospital or call my health care provider? ? Your contractions have been 5 minutes apart or less for at least 1 hour. ? If several contractions are so painful you cannot walk or talk during one. ? Your bag of waters breaks. (You may have a big gush of water or just water that runs down your legs when you walk.)  Are there other reasons to call my health care  provider? Yes, you should call your health care provider or go to the hospital if you start to bleed like you are having a period- blood that soaks your underwear or runs down your legs, if you have sudden severe pain, if your baby has not moved for several hours, or if you are leaking green fluid. The rule is as follows: If you are very concerned about something, call.

## 2015-07-15 NOTE — Progress Notes (Signed)
Pt states that she has numbness in her finger tips when she wakes up in the mornings.

## 2015-07-29 ENCOUNTER — Encounter: Payer: Medicaid Other | Admitting: Advanced Practice Midwife

## 2015-07-29 ENCOUNTER — Inpatient Hospital Stay (HOSPITAL_COMMUNITY)
Admission: AD | Admit: 2015-07-29 | Discharge: 2015-07-29 | Disposition: A | Discharge status: Home / Self Care | Payer: Medicaid Other | Source: Ambulatory Visit | Attending: Obstetrics and Gynecology | Admitting: Obstetrics and Gynecology

## 2015-07-29 ENCOUNTER — Encounter (HOSPITAL_COMMUNITY): Payer: Self-pay | Admitting: *Deleted

## 2015-07-29 DIAGNOSIS — L732 Hidradenitis suppurativa: Secondary | ICD-10-CM | POA: Diagnosis not present

## 2015-07-29 DIAGNOSIS — Z3A36 36 weeks gestation of pregnancy: Secondary | ICD-10-CM | POA: Insufficient documentation

## 2015-07-29 DIAGNOSIS — Z87891 Personal history of nicotine dependence: Secondary | ICD-10-CM | POA: Insufficient documentation

## 2015-07-29 DIAGNOSIS — Z882 Allergy status to sulfonamides status: Secondary | ICD-10-CM | POA: Insufficient documentation

## 2015-07-29 DIAGNOSIS — O9989 Other specified diseases and conditions complicating pregnancy, childbirth and the puerperium: Secondary | ICD-10-CM | POA: Insufficient documentation

## 2015-07-29 DIAGNOSIS — M791 Myalgia: Secondary | ICD-10-CM | POA: Diagnosis not present

## 2015-07-29 DIAGNOSIS — Z880 Allergy status to penicillin: Secondary | ICD-10-CM | POA: Insufficient documentation

## 2015-07-29 DIAGNOSIS — L0231 Cutaneous abscess of buttock: Secondary | ICD-10-CM | POA: Diagnosis present

## 2015-07-29 MED ORDER — CLINDAMYCIN HCL 300 MG PO CAPS: 300.0000 mg | ORAL_CAPSULE | Freq: Three times a day (TID) | ORAL | Status: DC

## 2015-07-29 MED ORDER — OXYCODONE-ACETAMINOPHEN 5-325 MG PO TABS: 1.0000 | ORAL_TABLET | Freq: Four times a day (QID) | ORAL | Status: DC | PRN

## 2015-07-29 MED ORDER — OXYCODONE-ACETAMINOPHEN 5-325 MG PO TABS
1.0000 | ORAL_TABLET | Freq: Once | ORAL | Status: AC
  Administered 2015-07-29: 1 via ORAL
  Filled 2015-07-29: qty 1

## 2015-07-29 NOTE — Discharge Instructions (Signed)
You need to take your clindamycin. Take  three times per day for the next 10 days. You can use warm compresses to help with pain. We have given you #10 percocet to help with the pain. If you need more you need to be seen by your doctors in clinic or your dermatologist.   Hidradenitis Suppurativa, Sweat Gland Abscess Hidradenitis suppurativa is a long lasting (chronic), uncommon disease of the sweat glands. With this, boil-like lumps and scarring develop in the groin, some times under the arms (axillae), and under the breasts. It may also uncommonly occur behind the ears, in the crease of the buttocks, and around the genitals.  CAUSES  The cause is from a blocking of the sweat glands. They then become infected. It may cause drainage and odor. It is not contagious. So it cannot be given to someone else. It most often shows up in puberty (about 67 to 21 years of age). But it may happen much later. It is similar to acne which is a disease of the sweat glands. This condition is slightly more common in African-Americans and women. SYMPTOMS   Hidradenitis usually starts as one or more red, tender, swellings in the groin or under the arms (axilla).  Over a period of hours to days the lesions get larger. They often open to the skin surface, draining clear to yellow-colored fluid.  The infected area heals with scarring. DIAGNOSIS  Your caregiver makes this diagnosis by looking at you. Sometimes cultures (growing germs on plates in the lab) may be taken. This is to see what germ (bacterium) is causing the infection.  TREATMENT   Topical germ killing medicine applied to the skin (antibiotics) are the treatment of choice. Antibiotics taken by mouth (systemic) are sometimes needed when the condition is getting worse or is severe.  Avoid tight-fitting clothing which traps moisture in.  Dirt does not cause hidradenitis and it is not caused by poor hygiene.  Involved areas should be cleaned daily using an  antibacterial soap. Some patients find that the liquid form of Lever 2000, applied to the involved areas as a lotion after bathing, can help reduce the odor related to this condition.  Sometimes surgery is needed to drain infected areas or remove scarred tissue. Removal of large amounts of tissue is used only in severe cases.  Birth control pills may be helpful.  Oral retinoids (vitamin A derivatives) for 6 to 12 months which are effective for acne may also help this condition.  Weight loss will improve but not cure hidradenitis. It is made worse by being overweight. But the condition is not caused by being overweight.  This condition is more common in people who have had acne.  It may become worse under stress. There is no medical cure for hidradenitis. It can be controlled, but not cured. The condition usually continues for years with periods of getting worse and getting better (remission). Document Released: 07/05/2004 Document Revised: 02/13/2012 Document Reviewed: 02/21/2014 [facility] Patient Information 2015 [place], [place]. This information is not intended to replace advice given to you by your health care provider. Make sure you discuss any questions you have with your health care provider.

## 2015-07-29 NOTE — MAU Provider Note (Signed)
History     CSN: [id number]  Arrival date and time: 07/29/15 1851   HPI  Patient is 19 y.o. G1P0 [redacted]w[redacted]d here with complaints of pain due to abscess. PMH of suppurative hydradenitis.   Ms. 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 She says it is causing her great pain, and she would like to either have it lanced or receive pain medication. She denies fevers, chills, weakness, dizziness/lightheadedness. She presented two weeks ago for a similar abscess on her thigh, and was given clindamycin. She said she took the abx and the abscess went away, but she stopped taking the clinda 2-3 days ago. She is actually prescribed clinda daily, but stopped taking this as prescribed a few years ago as she felt it was no longer effective.  She says she has had no complications during her pregnancy. She had a prenatal appt today, but missed it due to car trouble. Her last prenatal appt was two weeks ago. +FM, denies LOF, VB, contractions, vaginal discharge.   Past Medical History  Diagnosis Date  . Anemia   . Asthma   . Hidradenitis suppurativa     had excisions in axilla and groin  . Pregnant 12/24/2014  . Hard of hearing     Past Surgical History  Procedure Laterality Date  . Axillary hidradenitis excision    . Inguinal hidradenitis excision    . Tonsillectomy and adenoidectomy      Family History  Problem Relation Age of Onset  . Anemia Mother   . Hypertension Mother   . Other Mother     hidradenitis  . Epilepsy Father   . Other Sister     hidradenitis  . Epilepsy Brother   . Other Paternal Grandmother     aneursym  . Cancer Paternal Grandfather     Social History  Substance Use Topics  . Smoking status: Former Smoker -- 0.25 packs/day for 3 years    Types: Cigarettes    Quit date: 06/02/2015  . Smokeless tobacco: Never Used  . Alcohol Use: No    Allergies:  Allergies  Allergen Reactions  . Augmentin  [Amoxicillin-Pot Clavulanate] Hives  . Ceclor [Cefaclor] Hives  . Penicillins Hives  . Sulfa Antibiotics Hives  . Tomato Hives    No prescriptions prior to admission    Review of Systems  Constitutional: Negative for fever and chills.  Gastrointestinal: Negative for abdominal pain and constipation.  Musculoskeletal: Positive for back pain.  Skin:       Abscess on back, non-draining  Neurological: Negative for dizziness and weakness.   Physical Exam   Blood pressure 122/78, pulse 89, last menstrual period 11/20/2014.  Physical Exam  Constitutional: She is oriented to person, place, and time. She appears well-developed and well-nourished. No distress.  HENT:  Head: Normocephalic and atraumatic.  Cardiovascular: Normal rate, regular rhythm and normal heart sounds.   No murmur heard. Respiratory: Effort normal and breath sounds normal. No respiratory distress. She has no wheezes.  GI: Soft. There is no tenderness.  Gravid  Neurological: She is alert and oriented to person, place, and time. No cranial nerve deficit.  Skin:  Large (~6 in x 0.5-1 in) tender furuncle in gluteal cleft, no drainage/bleeding; surgical scars from prior I&Ds/debridements noted in same area    Psychiatric: She has a normal mood and affect. Her behavior is normal.    MAU Course  Procedures None  Assessment and Plan   A:  Patient is 19 y.o. G1P0 [redacted]w[redacted]d reporting pain likely secondary to abscess 2/2 to chronic suppurative hidradenitis.   P: Discharge home - Reviewed findings and my conclusion - Handout given - Has rescheduled prenatal appt at [facility] tomorrow  - Encouraged to follow-up with dermatologist ASAP  - Clindamycin 300 mg TID x 10 days - Percocet 5-325 for pain (10 tablets)    Isa Rankin [facility] 07/29/2015, 9:26 PM   OB fellow attestation: I have seen and examined this patient; I agree with above documentation in the resident's note.   Zanasia Hickson is a 19 y.o. G1P0 reporting  gluteal cleft pain related to abscess/hidradenitis +FM, denies LOF, VB, contractions, vaginal discharge.  PE: BP 122/78 mmHg  Pulse 89  LMP 11/20/2014 Gen: calm comfortable, NAD Resp: normal effort, no distress Abd: gravid Back/Gluteal cleft: 1cm fluctuate area, mild TTP located to the right of cleft.  ROS, labs, PMH reviewed NST reactive   Plan: -Discussed plan with resident and agree this area is not amenable to drainage at this point and is not likely to help -Clindamycin for treatment - #10 percocet for pain.  -Encourage involving dermatology  Federico Flake, MD 9:26 PM

## 2015-07-30 ENCOUNTER — Encounter (HOSPITAL_COMMUNITY): Payer: Self-pay

## 2015-07-30 ENCOUNTER — Encounter: Payer: Self-pay | Admitting: Obstetrics & Gynecology

## 2015-07-30 ENCOUNTER — Ambulatory Visit (INDEPENDENT_AMBULATORY_CARE_PROVIDER_SITE_OTHER): Payer: Medicaid Other | Admitting: Obstetrics & Gynecology

## 2015-07-30 VITALS — BP 108/60 | HR 76 | Wt 244.0 lb

## 2015-07-30 DIAGNOSIS — Z3685 Encounter for antenatal screening for Streptococcus B: Secondary | ICD-10-CM

## 2015-07-30 DIAGNOSIS — Z3403 Encounter for supervision of normal first pregnancy, third trimester: Secondary | ICD-10-CM

## 2015-07-30 DIAGNOSIS — Z331 Pregnant state, incidental: Secondary | ICD-10-CM

## 2015-07-30 DIAGNOSIS — Z118 Encounter for screening for other infectious and parasitic diseases: Secondary | ICD-10-CM

## 2015-07-30 DIAGNOSIS — Z1389 Encounter for screening for other disorder: Secondary | ICD-10-CM

## 2015-07-30 DIAGNOSIS — Z1159 Encounter for screening for other viral diseases: Secondary | ICD-10-CM

## 2015-07-30 LAB — POCT URINALYSIS DIPSTICK
Blood, UA: NEGATIVE
Glucose, UA: NEGATIVE
Ketones, UA: NEGATIVE
LEUKOCYTES UA: NEGATIVE
NITRITE UA: NEGATIVE

## 2015-07-30 LAB — OB RESULTS CONSOLE GBS: GBS: POSITIVE

## 2015-07-30 NOTE — Progress Notes (Signed)
G1P0 [redacted]w[redacted]d Estimated Date of Delivery: 08/20/15  Blood pressure 108/60, pulse 76, weight 244 lb (110.678 kg), last menstrual period 11/20/2014.   BP weight and urine results all reviewed and noted.  Please refer to the obstetrical flow sheet for the fundal height and fetal heart rate documentation:  Patient reports good fetal movement, denies any bleeding and no rupture of membranes symptoms or regular contractions. Patient is without complaints. All questions were answered.  Plan:  Continued routine obstetrical care,   Follow up in 1 weeks for OB appointment,   Cont cleocin for HS

## 2015-08-01 LAB — GC/CHLAMYDIA PROBE AMP
CHLAMYDIA, DNA PROBE: NEGATIVE
NEISSERIA GONORRHOEAE BY PCR: NEGATIVE

## 2015-08-02 LAB — CULTURE, BETA STREP (GROUP B ONLY): STREP GP B CULTURE: POSITIVE — AB

## 2015-08-05 ENCOUNTER — Institutional Professional Consult (permissible substitution): Payer: Medicaid Other | Admitting: Pediatrics

## 2015-08-06 ENCOUNTER — Ambulatory Visit (INDEPENDENT_AMBULATORY_CARE_PROVIDER_SITE_OTHER): Payer: Self-pay | Admitting: Advanced Practice Midwife

## 2015-08-06 ENCOUNTER — Encounter: Payer: Self-pay | Admitting: Advanced Practice Midwife

## 2015-08-06 ENCOUNTER — Encounter: Payer: Medicaid Other | Admitting: Advanced Practice Midwife

## 2015-08-06 VITALS — BP 120/62 | HR 92 | Wt 248.5 lb

## 2015-08-06 DIAGNOSIS — Z331 Pregnant state, incidental: Secondary | ICD-10-CM

## 2015-08-06 DIAGNOSIS — Z3403 Encounter for supervision of normal first pregnancy, third trimester: Secondary | ICD-10-CM

## 2015-08-06 DIAGNOSIS — Z1389 Encounter for screening for other disorder: Secondary | ICD-10-CM

## 2015-08-06 LAB — POCT URINALYSIS DIPSTICK
Blood, UA: NEGATIVE
GLUCOSE UA: NEGATIVE
KETONES UA: NEGATIVE
Leukocytes, UA: NEGATIVE
NITRITE UA: NEGATIVE
Protein, UA: NEGATIVE

## 2015-08-06 NOTE — Progress Notes (Signed)
G1P0 [redacted]w[redacted]d Estimated Date of Delivery: 08/20/15  Last menstrual period 11/20/2014.   BP weight and urine results all reviewed and noted.  Please refer to the obstetrical flow sheet for the fundal height and fetal heart rate documentation:  Patient reports good fetal movement, denies any bleeding and no rupture of membranes symptoms or regular contractions. Patient is without complaints. All questions were answered.  No orders of the defined types were placed in this encounter.    Plan:  Continued routine obstetrical care,   Return in about 1 week (around 08/13/2015) for LROB.

## 2015-08-12 ENCOUNTER — Encounter (HOSPITAL_COMMUNITY): Payer: Self-pay

## 2015-08-12 ENCOUNTER — Inpatient Hospital Stay (HOSPITAL_COMMUNITY)
Admission: AD | Admit: 2015-08-12 | Discharge: 2015-08-12 | Disposition: A | Discharge status: Home / Self Care | Payer: Medicaid Other | Source: Ambulatory Visit | Attending: Obstetrics & Gynecology | Admitting: Obstetrics & Gynecology

## 2015-08-12 ENCOUNTER — Institutional Professional Consult (permissible substitution): Payer: Medicaid Other | Admitting: Pediatrics

## 2015-08-12 DIAGNOSIS — A749 Chlamydial infection, unspecified: Secondary | ICD-10-CM

## 2015-08-12 DIAGNOSIS — Z3493 Encounter for supervision of normal pregnancy, unspecified, third trimester: Secondary | ICD-10-CM | POA: Diagnosis not present

## 2015-08-12 NOTE — MAU Note (Signed)
Pt reports she is having tightening in her abd x 4 hours, states it is not painful. Denies bleeding or ROM

## 2015-08-12 NOTE — Discharge Instructions (Signed)
Braxton Hicks Contractions °Contractions of the uterus can occur throughout pregnancy. Contractions are not always a sign that you are in labor.  °WHAT ARE BRAXTON HICKS CONTRACTIONS?  °Contractions that occur before labor are called Braxton Hicks contractions, or false labor. Toward the end of pregnancy (32-34 weeks), these contractions can develop more often and may become more forceful. This is not true labor because these contractions do not result in opening (dilatation) and thinning of the cervix. They are sometimes difficult to tell apart from true labor because these contractions can be forceful and people have different pain tolerances. You should not feel embarrassed if you go to the hospital with false labor. Sometimes, the only way to tell if you are in true labor is for your health care provider to look for changes in the cervix. °If there are no prenatal problems or other health problems associated with the pregnancy, it is completely safe to be sent home with false labor and await the onset of true labor. °HOW CAN YOU TELL THE DIFFERENCE BETWEEN TRUE AND FALSE LABOR? °False Labor °· The contractions of false labor are usually shorter and not as hard as those of true labor.   °· The contractions are usually irregular.   °· The contractions are often felt in the front of the lower abdomen and in the groin.   °· The contractions may go away when you walk around or change positions while lying down.   °· The contractions get weaker and are shorter lasting as time goes on.   °· The contractions do not usually become progressively stronger, regular, and closer together as with true labor.   °True Labor °1. Contractions in true labor last 30-70 seconds, become very regular, usually become more intense, and increase in frequency.   °2. The contractions do not go away with walking.   °3. The discomfort is usually felt in the top of the uterus and spreads to the lower abdomen and low back.   °4. True labor can  be determined by your health care provider with an exam. This will show that the cervix is dilating and getting thinner.   °WHAT TO REMEMBER °· Keep up with your usual exercises and follow other instructions given by your health care provider.   °· Take medicines as directed by your health care provider.   °· Keep your regular prenatal appointments.   °· Eat and drink lightly if you think you are going into labor.   °· If Braxton Hicks contractions are making you uncomfortable:   °· Change your position from lying down or resting to walking, or from walking to resting.   °· Sit and rest in a tub of warm water.   °· Drink 2-3 glasses of water. Dehydration may cause these contractions.   °· Do slow and deep breathing several times an hour.   °WHEN SHOULD I SEEK IMMEDIATE MEDICAL CARE? °Seek immediate medical care if: °· Your contractions become stronger, more regular, and closer together.   °· You have fluid leaking or gushing from your vagina.   °· You have a fever.   °· You pass blood-tinged mucus.   °· You have vaginal bleeding.   °· You have continuous abdominal pain.   °· You have low back pain that you never had before.   °· You feel your baby's head pushing down and causing pelvic pressure.   °· Your baby is not moving as much as it used to.   °Document Released: 11/21/2005 Document Revised: 11/26/2013 Document Reviewed: 09/02/2013 °Exit[employer]® Patient Information ©2015 [employer]. This information is not intended to replace advice given to you by your health care   provider. Make sure you discuss any questions you have with your health care provider. ° °Fetal Movement Counts °Patient Name: __________________________________________________ Patient Due Date: ____________________ °Performing a fetal movement count is highly recommended in high-risk pregnancies, but it is good for every pregnant woman to do. Your health care provider may ask you to start counting fetal movements at 28 weeks of the pregnancy. Fetal  movements often increase: °· After eating a full meal. °· After physical activity. °· After eating or drinking something sweet or cold. °· At rest. °Pay attention to when you feel the baby is most active. This will help you notice a pattern of your baby's sleep and wake cycles and what factors contribute to an increase in fetal movement. It is important to perform a fetal movement count at the same time each day when your baby is normally most active.  °HOW TO COUNT FETAL MOVEMENTS °5. Find a quiet and comfortable area to sit or lie down on your left side. Lying on your left side provides the best blood and oxygen circulation to your baby. °6. Write down the day and time on a sheet of paper or in a journal. °7. Start counting kicks, flutters, swishes, rolls, or jabs in a 2-hour period. You should feel at least 10 movements within 2 hours. °8. If you do not feel 10 movements in 2 hours, wait 2-3 hours and count again. Look for a change in the pattern or not enough counts in 2 hours. °SEEK MEDICAL CARE IF: °· You feel less than 10 counts in 2 hours, tried twice. °· There is no movement in over an hour. °· The pattern is changing or taking longer each day to reach 10 counts in 2 hours. °· You feel the baby is not moving as he or she usually does. °Date: ____________ Movements: ____________ Start time: ____________ Finish time: ____________  °Date: ____________ Movements: ____________ Start time: ____________ Finish time: ____________ °Date: ____________ Movements: ____________ Start time: ____________ Finish time: ____________ °Date: ____________ Movements: ____________ Start time: ____________ Finish time: ____________ °Date: ____________ Movements: ____________ Start time: ____________ Finish time: ____________ °Date: ____________ Movements: ____________ Start time: ____________ Finish time: ____________ °Date: ____________ Movements: ____________ Start time: ____________ Finish time: ____________ °Date: ____________  Movements: ____________ Start time: ____________ Finish time: ____________  °Date: ____________ Movements: ____________ Start time: ____________ Finish time: ____________ °Date: ____________ Movements: ____________ Start time: ____________ Finish time: ____________ °Date: ____________ Movements: ____________ Start time: ____________ Finish time: ____________ °Date: ____________ Movements: ____________ Start time: ____________ Finish time: ____________ °Date: ____________ Movements: ____________ Start time: ____________ Finish time: ____________ °Date: ____________ Movements: ____________ Start time: ____________ Finish time: ____________ °Date: ____________ Movements: ____________ Start time: ____________ Finish time: ____________  °Date: ____________ Movements: ____________ Start time: ____________ Finish time: ____________ °Date: ____________ Movements: ____________ Start time: ____________ Finish time: ____________ °Date: ____________ Movements: ____________ Start time: ____________ Finish time: ____________ °Date: ____________ Movements: ____________ Start time: ____________ Finish time: ____________ °Date: ____________ Movements: ____________ Start time: ____________ Finish time: ____________ °Date: ____________ Movements: ____________ Start time: ____________ Finish time: ____________ °Date: ____________ Movements: ____________ Start time: ____________ Finish time: ____________  °Date: ____________ Movements: ____________ Start time: ____________ Finish time: ____________ °Date: ____________ Movements: ____________ Start time: ____________ Finish time: ____________ °Date: ____________ Movements: ____________ Start time: ____________ Finish time: ____________ °Date: ____________ Movements: ____________ Start time: ____________ Finish time: ____________ °Date: ____________ Movements: ____________ Start time: ____________ Finish time: ____________ °Date: ____________ Movements: ____________ Start time:  ____________ Finish time: ____________ °Date: ____________ Movements:   ____________ Start time: ____________ Finish time: ____________  °Date: ____________ Movements: ____________ Start time: ____________ Finish time: ____________ °Date: ____________ Movements: ____________ Start time: ____________ Finish time: ____________ °Date: ____________ Movements: ____________ Start time: ____________ Finish time: ____________ °Date: ____________ Movements: ____________ Start time: ____________ Finish time: ____________ °Date: ____________ Movements: ____________ Start time: ____________ Finish time: ____________ °Date: ____________ Movements: ____________ Start time: ____________ Finish time: ____________ °Date: ____________ Movements: ____________ Start time: ____________ Finish time: ____________  °Date: ____________ Movements: ____________ Start time: ____________ Finish time: ____________ °Date: ____________ Movements: ____________ Start time: ____________ Finish time: ____________ °Date: ____________ Movements: ____________ Start time: ____________ Finish time: ____________ °Date: ____________ Movements: ____________ Start time: ____________ Finish time: ____________ °Date: ____________ Movements: ____________ Start time: ____________ Finish time: ____________ °Date: ____________ Movements: ____________ Start time: ____________ Finish time: ____________ °Date: ____________ Movements: ____________ Start time: ____________ Finish time: ____________  °Date: ____________ Movements: ____________ Start time: ____________ Finish time: ____________ °Date: ____________ Movements: ____________ Start time: ____________ Finish time: ____________ °Date: ____________ Movements: ____________ Start time: ____________ Finish time: ____________ °Date: ____________ Movements: ____________ Start time: ____________ Finish time: ____________ °Date: ____________ Movements: ____________ Start time: ____________ Finish time: ____________ °Date:  ____________ Movements: ____________ Start time: ____________ Finish time: ____________ °Date: ____________ Movements: ____________ Start time: ____________ Finish time: ____________  °Date: ____________ Movements: ____________ Start time: ____________ Finish time: ____________ °Date: ____________ Movements: ____________ Start time: ____________ Finish time: ____________ °Date: ____________ Movements: ____________ Start time: ____________ Finish time: ____________ °Date: ____________ Movements: ____________ Start time: ____________ Finish time: ____________ °Date: ____________ Movements: ____________ Start time: ____________ Finish time: ____________ °Date: ____________ Movements: ____________ Start time: ____________ Finish time: ____________ °Document Released: 12/21/2006 Document Revised: 04/07/2014 Document Reviewed: 09/17/2012 °ExitCare® Patient Information ©2015 [employer]. This information is not intended to replace advice given to you by your health care provider. Make sure you discuss any questions you have with your health care provider. ° °

## 2015-08-13 ENCOUNTER — Ambulatory Visit (INDEPENDENT_AMBULATORY_CARE_PROVIDER_SITE_OTHER): Payer: Self-pay | Admitting: Advanced Practice Midwife

## 2015-08-13 ENCOUNTER — Encounter: Payer: Medicaid Other | Admitting: Advanced Practice Midwife

## 2015-08-13 ENCOUNTER — Encounter: Payer: Self-pay | Admitting: Advanced Practice Midwife

## 2015-08-13 VITALS — BP 118/76 | HR 100 | Wt 251.0 lb

## 2015-08-13 DIAGNOSIS — Z3403 Encounter for supervision of normal first pregnancy, third trimester: Secondary | ICD-10-CM

## 2015-08-13 DIAGNOSIS — Z1389 Encounter for screening for other disorder: Secondary | ICD-10-CM

## 2015-08-13 DIAGNOSIS — Z331 Pregnant state, incidental: Secondary | ICD-10-CM

## 2015-08-13 LAB — POCT URINALYSIS DIPSTICK
Blood, UA: NEGATIVE
GLUCOSE UA: NEGATIVE
Ketones, UA: NEGATIVE
Leukocytes, UA: NEGATIVE
NITRITE UA: NEGATIVE
Protein, UA: NEGATIVE

## 2015-08-13 NOTE — Progress Notes (Signed)
G1P0 [redacted]w[redacted]d Estimated Date of Delivery: 08/20/15  Blood pressure 118/76, pulse 100, weight 251 lb (113.853 kg), last menstrual period 11/20/2014.   BP weight and urine results all reviewed and noted.  Please refer to the obstetrical flow sheet for the fundal height and fetal heart rate documentation:  Patient reports good fetal movement, denies any bleeding and no rupture of membranes symptoms or regular contractions. Patient is without complaints. All questions were answered.  Orders Placed This Encounter  Procedures  . POCT urinalysis dipstick    Plan:  Continued routine obstetrical care,   Return in about 1 week (around 08/20/2015) for LROB.

## 2015-08-13 NOTE — Progress Notes (Signed)
Pt denies any problems or concerns at this time.  

## 2015-08-18 ENCOUNTER — Ambulatory Visit (INDEPENDENT_AMBULATORY_CARE_PROVIDER_SITE_OTHER): Payer: Self-pay | Admitting: Pediatrics

## 2015-08-18 DIAGNOSIS — Z3490 Encounter for supervision of normal pregnancy, unspecified, unspecified trimester: Secondary | ICD-10-CM

## 2015-08-18 DIAGNOSIS — Z7681 Expectant parent(s) prebirth pediatrician visit: Secondary | ICD-10-CM

## 2015-08-18 NOTE — Progress Notes (Signed)
Prenatal counseling for impending newborn done-- Z76.81  

## 2015-08-20 ENCOUNTER — Ambulatory Visit (INDEPENDENT_AMBULATORY_CARE_PROVIDER_SITE_OTHER): Payer: Medicaid Other | Admitting: Advanced Practice Midwife

## 2015-08-20 ENCOUNTER — Encounter: Payer: Self-pay | Admitting: Advanced Practice Midwife

## 2015-08-20 VITALS — BP 120/76 | HR 88 | Wt 248.0 lb

## 2015-08-20 DIAGNOSIS — Z3403 Encounter for supervision of normal first pregnancy, third trimester: Secondary | ICD-10-CM

## 2015-08-20 DIAGNOSIS — O26843 Uterine size-date discrepancy, third trimester: Secondary | ICD-10-CM

## 2015-08-20 DIAGNOSIS — Z1389 Encounter for screening for other disorder: Secondary | ICD-10-CM

## 2015-08-20 DIAGNOSIS — Z331 Pregnant state, incidental: Secondary | ICD-10-CM

## 2015-08-20 DIAGNOSIS — O480 Post-term pregnancy: Secondary | ICD-10-CM

## 2015-08-20 LAB — POCT URINALYSIS DIPSTICK
Blood, UA: NEGATIVE
Glucose, UA: NEGATIVE
Ketones, UA: NEGATIVE
Leukocytes, UA: NEGATIVE
Nitrite, UA: NEGATIVE
PROTEIN UA: NEGATIVE

## 2015-08-20 NOTE — Progress Notes (Signed)
G1P0 [redacted]w[redacted]d Estimated Date of Delivery: 08/20/15  Blood pressure 120/76, pulse 88, weight 248 lb (112.492 kg), last menstrual period 11/20/2014.   BP weight and urine results all reviewed and noted.  Please refer to the obstetrical flow sheet for the fundal height and fetal heart rate documentation:  Patient reports good fetal movement, denies any bleeding and no rupture of membranes symptoms or regular contractions. Patient is without complaints. All questions were answered.  Orders Placed This Encounter  Procedures  . POCT urinalysis dipstick    Plan:  Continued routine obstetrical care, BPP Monday for postdates.  IOL 9/22 at 2345 scheduled if undelivered  Return in about 4 days (around 08/24/2015) for US:BPP, LROB.

## 2015-08-20 NOTE — Patient Instructions (Addendum)
If you are still pregnant on 08/27/15 at 11:30pm, come to [facility] ([address] in [place], [place]) to start your induction!  Eat a light meal before you come.  Loretta Moore!!

## 2015-08-20 NOTE — Progress Notes (Signed)
Pt denies any problems or concerns at this time.  

## 2015-08-24 ENCOUNTER — Ambulatory Visit (INDEPENDENT_AMBULATORY_CARE_PROVIDER_SITE_OTHER): Payer: Medicaid Other

## 2015-08-24 ENCOUNTER — Ambulatory Visit (INDEPENDENT_AMBULATORY_CARE_PROVIDER_SITE_OTHER): Payer: Medicaid Other | Admitting: Women's Health

## 2015-08-24 ENCOUNTER — Encounter: Payer: Self-pay | Admitting: Women's Health

## 2015-08-24 VITALS — BP 112/60 | HR 94 | Wt 252.0 lb

## 2015-08-24 DIAGNOSIS — Z331 Pregnant state, incidental: Secondary | ICD-10-CM

## 2015-08-24 DIAGNOSIS — Z3A40 40 weeks gestation of pregnancy: Secondary | ICD-10-CM

## 2015-08-24 DIAGNOSIS — O480 Post-term pregnancy: Secondary | ICD-10-CM | POA: Diagnosis not present

## 2015-08-24 DIAGNOSIS — O26843 Uterine size-date discrepancy, third trimester: Secondary | ICD-10-CM

## 2015-08-24 DIAGNOSIS — A749 Chlamydial infection, unspecified: Secondary | ICD-10-CM

## 2015-08-24 DIAGNOSIS — Z1389 Encounter for screening for other disorder: Secondary | ICD-10-CM

## 2015-08-24 DIAGNOSIS — Z3403 Encounter for supervision of normal first pregnancy, third trimester: Secondary | ICD-10-CM | POA: Diagnosis not present

## 2015-08-24 LAB — POCT URINALYSIS DIPSTICK
Blood, UA: NEGATIVE
GLUCOSE UA: NEGATIVE
Ketones, UA: NEGATIVE
Leukocytes, UA: NEGATIVE
Nitrite, UA: NEGATIVE
PROTEIN UA: NEGATIVE

## 2015-08-24 NOTE — Progress Notes (Signed)
Low-risk OB appointment G1P0 [redacted]w[redacted]d Estimated Date of Delivery: 08/20/15 BP 112/60 mmHg  Pulse 94  Wt 252 lb (114.306 kg)  LMP 11/20/2014  BP, weight, and urine reviewed.  Refer to obstetrical flow sheet for FH & FHR.  Reports good fm.  Denies regular uc's, lof, vb, or uti s/s. No complaints. Reviewed today's normal u/s for postdates: bpp 8/8, afi 18, efw 49%. Plan:  Has IOL for postdates scheduled thurs @ midnight F/U in 4-6wks for pp visit

## 2015-08-24 NOTE — Progress Notes (Signed)
Pt denies any problems or concerns at this time.  

## 2015-08-24 NOTE — Progress Notes (Signed)
[place] 40+4wks, EFW 3492 g 49.5%,afi 18.4cm,fhr 155 bpm,ant pl gr 3,cephalic,BPP 8/8,bilat adnexa's wnl

## 2015-08-24 NOTE — Patient Instructions (Signed)
Call the office ([phone]) or go to [facility] if:  You begin to have strong, frequent contractions  Your water breaks.  Sometimes it is a big gush of fluid, sometimes it is just a trickle that keeps getting your panties wet or running down your legs  You have vaginal bleeding.  It is normal to have a small amount of spotting if your cervix was checked.   You don't feel your baby moving like normal.  If you don't, get you something to eat and drink and lay down and focus on feeling your baby move.  You should feel at least 10 movements in 2 hours.  If you don't, you should call the office or go to [facility].    Braxton Hicks Contractions Contractions of the uterus can occur throughout pregnancy. Contractions are not always a sign that you are in labor.  WHAT ARE BRAXTON HICKS CONTRACTIONS?  Contractions that occur before labor are called Braxton Hicks contractions, or false labor. Toward the end of pregnancy (32-34 weeks), these contractions can develop more often and may become more forceful. This is not true labor because these contractions do not result in opening (dilatation) and thinning of the cervix. They are sometimes difficult to tell apart from true labor because these contractions can be forceful and people have different pain tolerances. You should not feel embarrassed if you go to the hospital with false labor. Sometimes, the only way to tell if you are in true labor is for your health care provider to look for changes in the cervix. If there are no prenatal problems or other health problems associated with the pregnancy, it is completely safe to be sent home with false labor and await the onset of true labor. HOW CAN YOU TELL THE DIFFERENCE BETWEEN TRUE AND FALSE LABOR? False Labor  The contractions of false labor are usually shorter and not as hard as those of true labor.   The contractions are usually irregular.   The contractions are often felt in the front of  the lower abdomen and in the groin.   The contractions may go away when you walk around or change positions while lying down.   The contractions get weaker and are shorter lasting as time goes on.   The contractions do not usually become progressively stronger, regular, and closer together as with true labor.  True Labor  Contractions in true labor last 30-70 seconds, become very regular, usually become more intense, and increase in frequency.   The contractions do not go away with walking.   The discomfort is usually felt in the top of the uterus and spreads to the lower abdomen and low back.   True labor can be determined by your health care provider with an exam. This will show that the cervix is dilating and getting thinner.  WHAT TO REMEMBER  Keep up with your usual exercises and follow other instructions given by your health care provider.   Take medicines as directed by your health care provider.   Keep your regular prenatal appointments.   Eat and drink lightly if you think you are going into labor.   If Braxton Hicks contractions are making you uncomfortable:   Change your position from lying down or resting to walking, or from walking to resting.   Sit and rest in a tub of warm water.   Drink 2-3 glasses of water. Dehydration may cause these contractions.   Do slow and deep breathing several times an hour.    WHEN SHOULD I SEEK IMMEDIATE MEDICAL CARE? Seek immediate medical care if:  Your contractions become stronger, more regular, and closer together.   You have fluid leaking or gushing from your vagina.   You have a fever.   You pass blood-tinged mucus.   You have vaginal bleeding.   You have continuous abdominal pain.   You have low back pain that you never had before.   You feel your baby's head pushing down and causing pelvic pressure.   Your baby is not moving as much as it used to.  Document Released: 11/21/2005 Document  Revised: 11/26/2013 Document Reviewed: 09/02/2013 ExitCare Patient Information 2015 [employer]. This information is not intended to replace advice given to you by your health care provider. Make sure you discuss any questions you have with your health care provider.  

## 2015-08-25 ENCOUNTER — Telehealth (HOSPITAL_COMMUNITY): Payer: Self-pay | Admitting: *Deleted

## 2015-08-25 ENCOUNTER — Encounter (HOSPITAL_COMMUNITY): Payer: Self-pay | Admitting: *Deleted

## 2015-08-25 NOTE — Telephone Encounter (Signed)
Preadmission screen  

## 2015-08-27 ENCOUNTER — Encounter: Payer: Self-pay | Admitting: Advanced Practice Midwife

## 2015-08-27 NOTE — H&P (Signed)
Maki Hege is a 19 y.o. female G1P0 with IUP at [redacted]w[redacted]d presenting for IOL for postdates. [facility] 6 [facility]ks  Prenatal History/Complications:None  Past Medical History: Past Medical History  Diagnosis Date  . Anemia   . Asthma   . Hidradenitis suppurativa     had excisions in axilla and groin  . Pregnant 12/24/2014  . Hard of hearing     Past Surgical History: Past Surgical History  Procedure Laterality Date  . Axillary hidradenitis excision    . Inguinal hidradenitis excision    . Tonsillectomy and adenoidectomy      Obstetrical History: OB History    Gravida Para Term Preterm AB TAB SAB Ectopic Multiple Living   1               Social History: Social History   Social History  . Marital Status: Single    Spouse Name: N/A  . Number of Children: N/A  . Years of Education: N/A   Social History Main Topics  . Smoking status: Former Smoker -- 0.25 packs/day for 3 years    Types: Cigarettes    Quit date: 06/02/2015  . Smokeless tobacco: Never Used  . Alcohol Use: No  . Drug Use: No     Comment: multiple times a day  . Sexual Activity: Not Currently    Birth Control/ Protection: None   Other Topics Concern  . Not on file   Social History Narrative    Family History: Family History  Problem Relation Age of Onset  . Anemia Mother   . Hypertension Mother   . Other Mother     hidradenitis  . Epilepsy Father   . Other Sister     hidradenitis  . Epilepsy Brother   . Other Paternal Grandmother     aneursym  . Cancer Paternal Grandfather     Allergies: Allergies  Allergen Reactions  . Augmentin [Amoxicillin-Pot Clavulanate] Hives  . Ceclor [Cefaclor] Hives  . Penicillins Hives  . Sulfa Antibiotics Hives  . Tomato Hives     (Not in a hospital admission)   Prenatal Transfer Tool  Maternal Diabetes: No Genetic Screening: Normal Maternal Ultrasounds/Referrals: Normal Fetal Ultrasounds or other Referrals:  None Maternal Substance  Abuse:  No Significant Maternal Medications:  None Significant Maternal Lab Results: Lab values include: Group B Strep positive     Review of Systems   Constitutional: Negative for fever and chills Eyes: Negative for visual disturbances Respiratory: Negative for shortness of breath, dyspnea Cardiovascular: Negative for chest pain or palpitations  Gastrointestinal: Negative for vomiting, diarrhea and constipation.  POSITIVE for abdominal pain (contractions) Genitourinary: Negative for dysuria and urgency Musculoskeletal: Negative for back pain, joint pain, myalgias  Neurological: Negative for dizziness and headaches      Last menstrual period 11/20/2014. General appearance: alert, cooperative and no distress Lungs: clear to auscultation bilaterally Heart: regular rate and rhythm Abdomen: soft, non-tender; bowel sounds normal Pelvic: FT/long/-2 Extremities: Homans sign is negative, no sign of DVT DTR's 2+ Presentation: cephalic Fetal monitoring  Baseline: 140 bpm, Variability: Good {> 6 bpm), Accelerations: Reactive and Decelerations: Absent Uterine activity  None     Prenatal labs: ABO, Rh: O/--/-- (03/02 1422) Antibody: Negative (06/22 0849) Rubella:   RPR: Non Reactive (06/22 0849)  HBsAg: Negative (03/02 1422)  HIV: Non-reactive (06/22 0000) neg   [facility]  Initiated Care at  9 weeks  FOB [place], 19yo, 1st baby, dating  Dating By 8wk u/s  Pap <21  GC/CT Initial: -/+ POC: 36+wks:  Genetic Screen NT/IT: neg  CF screen neg  Anatomic [place] Female 'Major', limited views Repeat : normal at 24wks  Flu vaccine Recommended  Tdap Recommended ~ 28wks  Glucose Screen  2 hr normal: 81/124/52  GBS positive  Feed Preference breast  Contraception IUD  Circumcision Yes, at FT   Childbirth Classes Interested, info given  Pediatrician [facility]     EFW 49% last week   Results for orders placed or performed  during the hospital encounter of 08/28/15 (from the past 24 hour(s))  CBC   Collection Time: 08/28/15  2:35 AM  Result Value Ref Range   WBC 8.9 4.0 - 10.5 K/uL   RBC 3.93 3.87 - 5.11 MIL/uL   Hemoglobin 11.7 (L) 12.0 - 15.0 g/dL   HCT 16.1 (L) 09.6 - 04.5 %   MCV 87.8 78.0 - 100.0 fL   MCH 29.8 26.0 - 34.0 pg   MCHC 33.9 30.0 - 36.0 g/dL   RDW 40.9 81.1 - 91.4 %   Platelets 302 150 - 400 K/uL    Assessment: Haidee Stogsdill is a 19 y.o. G1P0 with an IUP at [redacted]w[redacted]d presenting for IOL for postdates  Plan: #Labor: cytotec>foley>pit #Pain:  Per request #FWB Cat 1 #ID: [id number]    CRESENZO-DISHMAN,FRANCES 08/28/2015, 3:50 AM

## 2015-08-28 ENCOUNTER — Inpatient Hospital Stay (HOSPITAL_COMMUNITY)
Admission: RE | Admit: 2015-08-28 | Discharge: 2015-09-01 | DRG: 765 | Disposition: A | Discharge status: Home / Self Care | Payer: Medicaid Other | Source: Ambulatory Visit | Attending: Family Medicine | Admitting: Family Medicine

## 2015-08-28 ENCOUNTER — Encounter (HOSPITAL_COMMUNITY): Payer: Self-pay

## 2015-08-28 VITALS — BP 127/53 | HR 78 | Temp 98.3°F | Resp 18 | Ht 60.0 in | Wt 252.0 lb

## 2015-08-28 DIAGNOSIS — Z3A41 41 weeks gestation of pregnancy: Secondary | ICD-10-CM | POA: Diagnosis present

## 2015-08-28 DIAGNOSIS — O99824 Streptococcus B carrier state complicating childbirth: Secondary | ICD-10-CM | POA: Diagnosis present

## 2015-08-28 DIAGNOSIS — Z98891 History of uterine scar from previous surgery: Secondary | ICD-10-CM

## 2015-08-28 DIAGNOSIS — Z6841 Body Mass Index (BMI) 40.0 and over, adult: Secondary | ICD-10-CM

## 2015-08-28 DIAGNOSIS — O9952 Diseases of the respiratory system complicating childbirth: Secondary | ICD-10-CM | POA: Diagnosis present

## 2015-08-28 DIAGNOSIS — K219 Gastro-esophageal reflux disease without esophagitis: Secondary | ICD-10-CM | POA: Diagnosis present

## 2015-08-28 DIAGNOSIS — O480 Post-term pregnancy: Secondary | ICD-10-CM | POA: Diagnosis present

## 2015-08-28 DIAGNOSIS — J45909 Unspecified asthma, uncomplicated: Secondary | ICD-10-CM | POA: Diagnosis present

## 2015-08-28 DIAGNOSIS — O99214 Obesity complicating childbirth: Secondary | ICD-10-CM | POA: Diagnosis present

## 2015-08-28 DIAGNOSIS — O9962 Diseases of the digestive system complicating childbirth: Secondary | ICD-10-CM | POA: Diagnosis present

## 2015-08-28 DIAGNOSIS — Z87891 Personal history of nicotine dependence: Secondary | ICD-10-CM | POA: Diagnosis not present

## 2015-08-28 DIAGNOSIS — A749 Chlamydial infection, unspecified: Secondary | ICD-10-CM

## 2015-08-28 DIAGNOSIS — Z3403 Encounter for supervision of normal first pregnancy, third trimester: Secondary | ICD-10-CM

## 2015-08-28 LAB — TYPE AND SCREEN
ABO/RH(D): O POS
Antibody Screen: NEGATIVE

## 2015-08-28 LAB — CBC
HEMATOCRIT: 34.5 % — AB (ref 36.0–46.0)
HEMOGLOBIN: 11.7 g/dL — AB (ref 12.0–15.0)
MCH: 29.8 pg (ref 26.0–34.0)
MCHC: 33.9 g/dL (ref 30.0–36.0)
MCV: 87.8 fL (ref 78.0–100.0)
Platelets: 302 10*3/uL (ref 150–400)
RBC: 3.93 MIL/uL (ref 3.87–5.11)
RDW: 14.8 % (ref 11.5–15.5)
WBC: 8.9 10*3/uL (ref 4.0–10.5)

## 2015-08-28 LAB — RPR: RPR: NONREACTIVE

## 2015-08-28 MED ORDER — INFLUENZA VAC SPLIT QUAD 0.5 ML IM SUSY
0.5000 mL | PREFILLED_SYRINGE | INTRAMUSCULAR | Status: DC
  Filled 2015-08-28: qty 0.5

## 2015-08-28 MED ORDER — CITRIC ACID-SODIUM CITRATE 334-500 MG/5ML PO SOLN
30.0000 mL | ORAL | Status: DC | PRN
  Administered 2015-08-29: 30 mL via ORAL
  Filled 2015-08-28: qty 15

## 2015-08-28 MED ORDER — LACTATED RINGERS IV SOLN
INTRAVENOUS | Status: DC
  Administered 2015-08-28 (×3): via INTRAVENOUS

## 2015-08-28 MED ORDER — VANCOMYCIN HCL IN DEXTROSE 1-5 GM/200ML-% IV SOLN
1000.0000 mg | Freq: Two times a day (BID) | INTRAVENOUS | Status: DC
  Administered 2015-08-28 – 2015-08-29 (×3): 1000 mg via INTRAVENOUS
  Filled 2015-08-28 (×4): qty 200

## 2015-08-28 MED ORDER — ZOLPIDEM TARTRATE 5 MG PO TABS: 5.0000 mg | ORAL_TABLET | Freq: Every evening | ORAL | Status: DC | PRN

## 2015-08-28 MED ORDER — FAMOTIDINE 20 MG PO TABS
20.0000 mg | ORAL_TABLET | Freq: Two times a day (BID) | ORAL | Status: DC | PRN
  Administered 2015-08-28: 20 mg via ORAL
  Filled 2015-08-28: qty 1

## 2015-08-28 MED ORDER — LACTATED RINGERS IV SOLN: 500.0000 mL | INTRAVENOUS | Status: DC | PRN

## 2015-08-28 MED ORDER — OXYTOCIN 40 UNITS IN LACTATED RINGERS INFUSION - SIMPLE MED: 62.5000 mL/h | INTRAVENOUS | Status: DC

## 2015-08-28 MED ORDER — FENTANYL CITRATE (PF) 100 MCG/2ML IJ SOLN
100.0000 ug | INTRAMUSCULAR | Status: DC | PRN
  Administered 2015-08-28 – 2015-08-29 (×2): 100 ug via INTRAVENOUS
  Filled 2015-08-28 (×2): qty 2

## 2015-08-28 MED ORDER — OXYCODONE-ACETAMINOPHEN 5-325 MG PO TABS: 1.0000 | ORAL_TABLET | ORAL | Status: DC | PRN

## 2015-08-28 MED ORDER — FENTANYL CITRATE (PF) 100 MCG/2ML IJ SOLN
50.0000 ug | INTRAMUSCULAR | Status: DC | PRN
  Administered 2015-08-28: 50 ug via INTRAVENOUS
  Filled 2015-08-28: qty 2

## 2015-08-28 MED ORDER — LIDOCAINE HCL (PF) 1 % IJ SOLN: 30.0000 mL | INTRAMUSCULAR | Status: DC | PRN

## 2015-08-28 MED ORDER — ONDANSETRON HCL 4 MG/2ML IJ SOLN
4.0000 mg | Freq: Four times a day (QID) | INTRAMUSCULAR | Status: DC | PRN
  Administered 2015-08-28: 4 mg via INTRAVENOUS
  Filled 2015-08-28: qty 2

## 2015-08-28 MED ORDER — FLEET ENEMA 7-19 GM/118ML RE ENEM: 1.0000 | ENEMA | RECTAL | Status: DC | PRN

## 2015-08-28 MED ORDER — OXYCODONE-ACETAMINOPHEN 5-325 MG PO TABS: 2.0000 | ORAL_TABLET | ORAL | Status: DC | PRN

## 2015-08-28 MED ORDER — PNEUMOCOCCAL VAC POLYVALENT 25 MCG/0.5ML IJ INJ
0.5000 mL | INJECTION | INTRAMUSCULAR | Status: DC
  Filled 2015-08-28: qty 0.5

## 2015-08-28 MED ORDER — TERBUTALINE SULFATE 1 MG/ML IJ SOLN
0.2500 mg | Freq: Once | INTRAMUSCULAR | Status: AC | PRN
  Administered 2015-08-29: 0.25 mg via SUBCUTANEOUS
  Filled 2015-08-28: qty 1

## 2015-08-28 MED ORDER — MISOPROSTOL 25 MCG QUARTER TABLET
25.0000 ug | ORAL_TABLET | ORAL | Status: DC | PRN
  Administered 2015-08-28 (×4): 25 ug via VAGINAL
  Filled 2015-08-28 (×5): qty 0.25

## 2015-08-28 MED ORDER — ACETAMINOPHEN 325 MG PO TABS: 650.0000 mg | ORAL_TABLET | ORAL | Status: DC | PRN

## 2015-08-28 MED ORDER — OXYTOCIN BOLUS FROM INFUSION: 500.0000 mL | INTRAVENOUS | Status: DC

## 2015-08-28 NOTE — Progress Notes (Signed)
   Aviance Cooperwood is a 19 y.o. G1P0 at [redacted]w[redacted]d  admitted for induction of labor due to Post dates..  Subjective: Feels a little crampy  Objective: Filed Vitals:   08/28/15 0336 08/28/15 0457 08/28/15 0517 08/28/15 0723  BP: 124/70  117/85 130/71  Pulse: 92  93 83  Temp:   97.7 F (36.5 C)   TempSrc:   Oral   Resp: Height:  5' (1.524 m)    Weight:  114.306 kg (252 lb)        FHT:  FHR: 145 bpm, variability: moderate,  accelerations:  Present,  decelerations:  Absent UC:   irritability SVE:   Dilation: Fingertip Effacement (%): Thick Station: -3 Exam by:: Drenda Freeze, CNM Cytotec placed in post vag fornix  Labs: Lab Results  Component Value Date   WBC 8.9 08/28/2015   HGB 11.7* 08/28/2015   HCT 34.5* 08/28/2015   MCV 87.8 08/28/2015   PLT 302 08/28/2015    Assessment / Plan: Ripening phase  Labor: no Fetal Wellbeing:  Category I Pain Control:  Labor support without medications Anticipated MOD:  NSVD  CRESENZO-DISHMAN,FRANCES 08/28/2015, 8:02 AM

## 2015-08-28 NOTE — Progress Notes (Signed)
Labor Progress Note  S: Patient comfortable. States she  Feels menstraul type cramping but nothing consistent or very painful. Declines plan to have epidural but notes she may change her mind  O:  BP 141/85 mmHg  Pulse 87  Temp(Src) 98 F (36.7 C) (Oral)  Resp 20  Ht 5' (1.524 m)  Wt 252 lb (114.306 kg)  BMI 49.22 kg/m2  LMP 11/20/2014 EFM: FHR: 135, moderate variability, + accels 15x15, no decels; toco-no contractions noted CVE: Dilation: 1 Effacement (%): Thick Cervical Position: Posterior Station: -3 Presentation: Vertex Exam by:: Dr. Redmond Baseman   A&P: 19 y.o. G1P0 [redacted]w[redacted]d here for IOL for postdates. Patient with h/o  #Labor: Continue augmentation with cytotec q4h prn. Foley bulb placed. Discussed pain control during labor, patient will think about it and let us know. #FWB: Category 1 tracing. Intermittent tracing okay for now. #GBS: positive, MDR, on vancomycin  [name], DO Resident Physician 8:58 PM

## 2015-08-29 ENCOUNTER — Inpatient Hospital Stay (HOSPITAL_COMMUNITY): Payer: Medicaid Other | Admitting: Anesthesiology

## 2015-08-29 ENCOUNTER — Encounter (HOSPITAL_COMMUNITY): Payer: Self-pay

## 2015-08-29 ENCOUNTER — Encounter (HOSPITAL_COMMUNITY)
Admission: RE | Disposition: A | Discharge status: Home / Self Care | Payer: Self-pay | Source: Ambulatory Visit | Attending: Family Medicine

## 2015-08-29 DIAGNOSIS — O99824 Streptococcus B carrier state complicating childbirth: Secondary | ICD-10-CM

## 2015-08-29 DIAGNOSIS — Z3A41 41 weeks gestation of pregnancy: Secondary | ICD-10-CM

## 2015-08-29 DIAGNOSIS — J45909 Unspecified asthma, uncomplicated: Secondary | ICD-10-CM

## 2015-08-29 DIAGNOSIS — Z87891 Personal history of nicotine dependence: Secondary | ICD-10-CM

## 2015-08-29 DIAGNOSIS — O9952 Diseases of the respiratory system complicating childbirth: Secondary | ICD-10-CM

## 2015-08-29 DIAGNOSIS — O9962 Diseases of the digestive system complicating childbirth: Secondary | ICD-10-CM

## 2015-08-29 DIAGNOSIS — O480 Post-term pregnancy: Secondary | ICD-10-CM

## 2015-08-29 DIAGNOSIS — O99214 Obesity complicating childbirth: Secondary | ICD-10-CM

## 2015-08-29 DIAGNOSIS — Z6841 Body Mass Index (BMI) 40.0 and over, adult: Secondary | ICD-10-CM

## 2015-08-29 LAB — ABO/RH: ABO/RH(D): O POS

## 2015-08-29 SURGERY — Surgical Case
Anesthesia: Epidural

## 2015-08-29 MED ORDER — NALBUPHINE HCL 10 MG/ML IJ SOLN
5.0000 mg | Freq: Once | INTRAMUSCULAR | Status: DC | PRN
  Filled 2015-08-29 (×2): qty 0.5

## 2015-08-29 MED ORDER — NALBUPHINE HCL 10 MG/ML IJ SOLN
5.0000 mg | Freq: Once | INTRAMUSCULAR | Status: DC | PRN
  Filled 2015-08-29: qty 0.5

## 2015-08-29 MED ORDER — DIPHENHYDRAMINE HCL 50 MG/ML IJ SOLN
12.5000 mg | INTRAMUSCULAR | Status: DC | PRN
  Administered 2015-08-29: 12.5 mg via INTRAVENOUS
  Filled 2015-08-29: qty 1

## 2015-08-29 MED ORDER — SCOPOLAMINE 1 MG/3DAYS TD PT72
1.0000 | MEDICATED_PATCH | Freq: Once | TRANSDERMAL | Status: DC
  Filled 2015-08-29: qty 1

## 2015-08-29 MED ORDER — IBUPROFEN 600 MG PO TABS
600.0000 mg | ORAL_TABLET | Freq: Four times a day (QID) | ORAL | Status: DC
  Administered 2015-08-29 – 2015-09-01 (×12): 600 mg via ORAL
  Filled 2015-08-29 (×13): qty 1

## 2015-08-29 MED ORDER — MEPERIDINE HCL 25 MG/ML IJ SOLN
INTRAMUSCULAR | Status: DC | PRN
  Administered 2015-08-29 (×2): 12.5 mg via INTRAVENOUS

## 2015-08-29 MED ORDER — SODIUM BICARBONATE 8.4 % IV SOLN
INTRAVENOUS | Status: DC | PRN
  Administered 2015-08-29: 5 mL via EPIDURAL

## 2015-08-29 MED ORDER — SCOPOLAMINE 1 MG/3DAYS TD PT72
MEDICATED_PATCH | TRANSDERMAL | Status: DC | PRN
  Administered 2015-08-29: 1 via TRANSDERMAL

## 2015-08-29 MED ORDER — ONDANSETRON HCL 4 MG/2ML IJ SOLN: 4.0000 mg | Freq: Three times a day (TID) | INTRAMUSCULAR | Status: DC | PRN

## 2015-08-29 MED ORDER — SODIUM CHLORIDE 0.9 % IJ SOLN: 3.0000 mL | INTRAMUSCULAR | Status: DC | PRN

## 2015-08-29 MED ORDER — DIBUCAINE 1 % RE OINT: 1.0000 "application " | TOPICAL_OINTMENT | RECTAL | Status: DC | PRN

## 2015-08-29 MED ORDER — SIMETHICONE 80 MG PO CHEW
80.0000 mg | CHEWABLE_TABLET | ORAL | Status: DC | PRN
  Filled 2015-08-29: qty 1

## 2015-08-29 MED ORDER — DIPHENHYDRAMINE HCL 25 MG PO CAPS: 25.0000 mg | ORAL_CAPSULE | Freq: Four times a day (QID) | ORAL | Status: DC | PRN

## 2015-08-29 MED ORDER — KETOROLAC TROMETHAMINE 30 MG/ML IJ SOLN: 30.0000 mg | Freq: Four times a day (QID) | INTRAMUSCULAR | Status: DC | PRN

## 2015-08-29 MED ORDER — ACETAMINOPHEN 325 MG PO TABS: 650.0000 mg | ORAL_TABLET | ORAL | Status: DC | PRN

## 2015-08-29 MED ORDER — FAMOTIDINE 20 MG PO TABS
40.0000 mg | ORAL_TABLET | Freq: Every day | ORAL | Status: DC
  Administered 2015-08-29 – 2015-09-01 (×4): 40 mg via ORAL
  Filled 2015-08-29 (×5): qty 2

## 2015-08-29 MED ORDER — OXYTOCIN 10 UNIT/ML IJ SOLN
INTRAMUSCULAR | Status: AC
  Filled 2015-08-29: qty 4

## 2015-08-29 MED ORDER — OXYCODONE-ACETAMINOPHEN 5-325 MG PO TABS
2.0000 | ORAL_TABLET | ORAL | Status: DC | PRN
  Administered 2015-08-30 – 2015-09-01 (×9): 2 via ORAL
  Filled 2015-08-29 (×10): qty 2

## 2015-08-29 MED ORDER — SIMETHICONE 80 MG PO CHEW
80.0000 mg | CHEWABLE_TABLET | Freq: Three times a day (TID) | ORAL | Status: DC
  Administered 2015-08-29 – 2015-09-01 (×9): 80 mg via ORAL
  Filled 2015-08-29 (×9): qty 1

## 2015-08-29 MED ORDER — DEXTROSE 5 % IV SOLN
1.0000 ug/kg/h | INTRAVENOUS | Status: DC | PRN
  Filled 2015-08-29: qty 2

## 2015-08-29 MED ORDER — PNEUMOCOCCAL VAC POLYVALENT 25 MCG/0.5ML IJ INJ
0.5000 mL | INJECTION | INTRAMUSCULAR | Status: AC
  Administered 2015-08-30: 0.5 mL via INTRAMUSCULAR
  Filled 2015-08-29: qty 0.5

## 2015-08-29 MED ORDER — MEPERIDINE HCL 25 MG/ML IJ SOLN
INTRAMUSCULAR | Status: AC
  Filled 2015-08-29: qty 1

## 2015-08-29 MED ORDER — MEPERIDINE HCL 25 MG/ML IJ SOLN: 6.2500 mg | INTRAMUSCULAR | Status: DC | PRN

## 2015-08-29 MED ORDER — EPHEDRINE 5 MG/ML INJ: 10.0000 mg | INTRAVENOUS | Status: DC | PRN

## 2015-08-29 MED ORDER — LIDOCAINE-EPINEPHRINE (PF) 2 %-1:200000 IJ SOLN
INTRAMUSCULAR | Status: DC | PRN
  Administered 2015-08-29: 4 mL

## 2015-08-29 MED ORDER — LACTATED RINGERS IV SOLN
125.0000 mL/h | INTRAVENOUS | Status: DC
  Administered 2015-08-29: 125 mL/h via INTRAVENOUS

## 2015-08-29 MED ORDER — MORPHINE SULFATE (PF) 0.5 MG/ML IJ SOLN
INTRAMUSCULAR | Status: DC | PRN
  Administered 2015-08-29: 4 mg via EPIDURAL
  Administered 2015-08-29: .5 mg via EPIDURAL
  Administered 2015-08-29: .5 mg via INTRAVENOUS

## 2015-08-29 MED ORDER — BUPIVACAINE HCL (PF) 0.25 % IJ SOLN
INTRAMUSCULAR | Status: DC | PRN
  Administered 2015-08-29 (×2): 4 mL via EPIDURAL

## 2015-08-29 MED ORDER — LACTATED RINGERS IV SOLN: INTRAVENOUS | Status: DC

## 2015-08-29 MED ORDER — PHENYLEPHRINE 40 MCG/ML (10ML) SYRINGE FOR IV PUSH (FOR BLOOD PRESSURE SUPPORT)
80.0000 ug | PREFILLED_SYRINGE | INTRAVENOUS | Status: DC | PRN
  Administered 2015-08-29 (×2): 80 ug via INTRAVENOUS
  Filled 2015-08-29: qty 20

## 2015-08-29 MED ORDER — TERBUTALINE SULFATE 1 MG/ML IJ SOLN
0.2500 mg | Freq: Once | INTRAMUSCULAR | Status: AC
  Administered 2015-08-29: 0.25 mg via SUBCUTANEOUS

## 2015-08-29 MED ORDER — ZOLPIDEM TARTRATE 5 MG PO TABS: 5.0000 mg | ORAL_TABLET | Freq: Every evening | ORAL | Status: DC | PRN

## 2015-08-29 MED ORDER — INFLUENZA VAC SPLIT QUAD 0.5 ML IM SUSY
0.5000 mL | PREFILLED_SYRINGE | INTRAMUSCULAR | Status: AC
  Administered 2015-08-30: 0.5 mL via INTRAMUSCULAR

## 2015-08-29 MED ORDER — NALBUPHINE HCL 10 MG/ML IJ SOLN
5.0000 mg | INTRAMUSCULAR | Status: DC | PRN
  Filled 2015-08-29: qty 0.5

## 2015-08-29 MED ORDER — METOCLOPRAMIDE HCL 5 MG/ML IJ SOLN
INTRAMUSCULAR | Status: AC
  Filled 2015-08-29: qty 2

## 2015-08-29 MED ORDER — DIPHENHYDRAMINE HCL 50 MG/ML IJ SOLN
INTRAMUSCULAR | Status: AC
  Filled 2015-08-29: qty 1

## 2015-08-29 MED ORDER — LANOLIN HYDROUS EX OINT: 1.0000 "application " | TOPICAL_OINTMENT | CUTANEOUS | Status: DC | PRN

## 2015-08-29 MED ORDER — MORPHINE SULFATE (PF) 0.5 MG/ML IJ SOLN
INTRAMUSCULAR | Status: AC
  Filled 2015-08-29: qty 100

## 2015-08-29 MED ORDER — DIPHENHYDRAMINE HCL 50 MG/ML IJ SOLN
INTRAMUSCULAR | Status: DC | PRN
  Administered 2015-08-29: 25 mg via INTRAVENOUS

## 2015-08-29 MED ORDER — DIPHENHYDRAMINE HCL 25 MG PO CAPS
25.0000 mg | ORAL_CAPSULE | ORAL | Status: DC | PRN
  Administered 2015-08-29 – 2015-08-30 (×2): 25 mg via ORAL
  Filled 2015-08-29 (×2): qty 1

## 2015-08-29 MED ORDER — SENNOSIDES-DOCUSATE SODIUM 8.6-50 MG PO TABS
2.0000 | ORAL_TABLET | ORAL | Status: DC
  Administered 2015-08-29 – 2015-09-01 (×3): 2 via ORAL
  Filled 2015-08-29 (×3): qty 2

## 2015-08-29 MED ORDER — TERBUTALINE SULFATE 1 MG/ML IJ SOLN
INTRAMUSCULAR | Status: AC
  Administered 2015-08-29: 0.25 mg via SUBCUTANEOUS
  Filled 2015-08-29: qty 1

## 2015-08-29 MED ORDER — METOCLOPRAMIDE HCL 5 MG/ML IJ SOLN
INTRAMUSCULAR | Status: DC | PRN
  Administered 2015-08-29: 10 mg via INTRAVENOUS

## 2015-08-29 MED ORDER — PHENYLEPHRINE HCL 10 MG/ML IJ SOLN
INTRAMUSCULAR | Status: DC | PRN
Start: 2015-08-29 — End: 2015-08-29
  Administered 2015-08-29: 80 ug via INTRAVENOUS

## 2015-08-29 MED ORDER — PRENATAL MULTIVITAMIN CH
1.0000 | ORAL_TABLET | Freq: Every day | ORAL | Status: DC
  Administered 2015-08-30 – 2015-09-01 (×3): 1 via ORAL
  Filled 2015-08-29 (×3): qty 1

## 2015-08-29 MED ORDER — TETANUS-DIPHTH-ACELL PERTUSSIS 5-2.5-18.5 LF-MCG/0.5 IM SUSP: 0.5000 mL | Freq: Once | INTRAMUSCULAR | Status: DC

## 2015-08-29 MED ORDER — SIMETHICONE 80 MG PO CHEW
80.0000 mg | CHEWABLE_TABLET | ORAL | Status: DC
  Administered 2015-08-29 – 2015-09-01 (×3): 80 mg via ORAL
  Filled 2015-08-29 (×3): qty 1

## 2015-08-29 MED ORDER — DIPHENHYDRAMINE HCL 50 MG/ML IJ SOLN: 12.5000 mg | INTRAMUSCULAR | Status: DC | PRN

## 2015-08-29 MED ORDER — OXYTOCIN 40 UNITS IN LACTATED RINGERS INFUSION - SIMPLE MED: 62.5000 mL/h | INTRAVENOUS | Status: AC

## 2015-08-29 MED ORDER — WITCH HAZEL-GLYCERIN EX PADS: 1.0000 "application " | MEDICATED_PAD | CUTANEOUS | Status: DC | PRN

## 2015-08-29 MED ORDER — FENTANYL 2.5 MCG/ML BUPIVACAINE 1/10 % EPIDURAL INFUSION (WH - ANES)
14.0000 mL/h | INTRAMUSCULAR | Status: DC | PRN
  Administered 2015-08-29: 14 mL/h via EPIDURAL
  Filled 2015-08-29: qty 125

## 2015-08-29 MED ORDER — NALOXONE HCL 0.4 MG/ML IJ SOLN: 0.4000 mg | INTRAMUSCULAR | Status: DC | PRN

## 2015-08-29 MED ORDER — MENTHOL 3 MG MT LOZG: 1.0000 | LOZENGE | OROMUCOSAL | Status: DC | PRN

## 2015-08-29 MED ORDER — GENTAMICIN SULFATE 40 MG/ML IJ SOLN
INTRAVENOUS | Status: AC
  Administered 2015-08-29: 115.25 mL via INTRAVENOUS
  Filled 2015-08-29: qty 9.25

## 2015-08-29 MED ORDER — OXYCODONE-ACETAMINOPHEN 5-325 MG PO TABS
1.0000 | ORAL_TABLET | ORAL | Status: DC | PRN
  Administered 2015-08-29 – 2015-08-31 (×3): 1 via ORAL
  Filled 2015-08-29 (×2): qty 1

## 2015-08-29 MED ORDER — NALBUPHINE HCL 10 MG/ML IJ SOLN
5.0000 mg | INTRAMUSCULAR | Status: DC | PRN
  Administered 2015-08-29: 5 mg via SUBCUTANEOUS
  Filled 2015-08-29 (×2): qty 0.5

## 2015-08-29 MED ORDER — ONDANSETRON HCL 4 MG/2ML IJ SOLN
INTRAMUSCULAR | Status: AC
  Filled 2015-08-29: qty 2

## 2015-08-29 MED ORDER — LACTATED RINGERS IV SOLN
INTRAVENOUS | Status: DC | PRN
  Administered 2015-08-29: 04:00:00 via INTRAVENOUS

## 2015-08-29 MED ORDER — FENTANYL CITRATE (PF) 100 MCG/2ML IJ SOLN: 25.0000 ug | INTRAMUSCULAR | Status: DC | PRN

## 2015-08-29 SURGICAL SUPPLY — 34 items
BENZOIN TINCTURE PRP APPL 2/3 (GAUZE/BANDAGES/DRESSINGS) ×2 IMPLANT
CATH ROBINSON RED A/P 16FR (CATHETERS) IMPLANT
CLAMP CORD UMBIL (MISCELLANEOUS) IMPLANT
CLOTH BEACON ORANGE TIMEOUT ST (SAFETY) ×2 IMPLANT
DRAPE SHEET LG 3/4 BI-LAMINATE (DRAPES) IMPLANT
DRESSING DISP NPWT PICO 4X16 (MISCELLANEOUS) ×2 IMPLANT
DRSG OPSITE POSTOP 4X10 (GAUZE/BANDAGES/DRESSINGS) ×2 IMPLANT
DURAPREP 26ML APPLICATOR (WOUND CARE) ×2 IMPLANT
ELECT REM PT RETURN 9FT ADLT (ELECTROSURGICAL) ×2
ELECTRODE REM PT RTRN 9FT ADLT (ELECTROSURGICAL) ×1 IMPLANT
EXTRACTOR VACUUM M CUP 4 TUBE (SUCTIONS) IMPLANT
GLOVE BIOGEL PI IND STRL 7.5 (GLOVE) ×2 IMPLANT
GLOVE BIOGEL PI INDICATOR 7.5 (GLOVE) ×2
GLOVE ECLIPSE 7.5 STRL STRAW (GLOVE) ×2 IMPLANT
GOWN STRL REUS W/TWL LRG LVL3 (GOWN DISPOSABLE) ×6 IMPLANT
KIT ABG SYR 3ML LUER SLIP (SYRINGE) IMPLANT
NEEDLE HYPO 25X5/8 SAFETYGLIDE (NEEDLE) IMPLANT
NS IRRIG 1000ML POUR BTL (IV SOLUTION) ×2 IMPLANT
PACK C SECTION WH (CUSTOM PROCEDURE TRAY) ×2 IMPLANT
PAD OB MATERNITY 4.3X12.25 (PERSONAL CARE ITEMS) ×2 IMPLANT
PENCIL SMOKE EVAC W/HOLSTER (ELECTROSURGICAL) ×2 IMPLANT
RETRACTOR WND ALEXIS 25 LRG (MISCELLANEOUS) ×1 IMPLANT
RTRCTR C-SECT PINK 25CM LRG (MISCELLANEOUS) ×2 IMPLANT
RTRCTR WOUND ALEXIS 25CM LRG (MISCELLANEOUS) ×2
STRIP CLOSURE SKIN 1/2X4 (GAUZE/BANDAGES/DRESSINGS) ×2 IMPLANT
SUT MNCRL 0 VIOLET CTX 36 (SUTURE) IMPLANT
SUT MONOCRYL 0 CTX 36 (SUTURE)
SUT VIC AB 0 CTX 36 (SUTURE) ×3
SUT VIC AB 0 CTX36XBRD ANBCTRL (SUTURE) ×3 IMPLANT
SUT VIC AB 2-0 CT1 27 (SUTURE) ×1
SUT VIC AB 2-0 CT1 TAPERPNT 27 (SUTURE) ×1 IMPLANT
SUT VIC AB 4-0 KS 27 (SUTURE) ×2 IMPLANT
TOWEL OR 17X24 6PK STRL BLUE (TOWEL DISPOSABLE) ×2 IMPLANT
TRAY FOLEY CATH SILVER 14FR (SET/KITS/TRAYS/PACK) ×2 IMPLANT

## 2015-08-29 NOTE — Anesthesia Procedure Notes (Signed)
Epidural Patient location during procedure: OB  Staffing Anesthesiologist: MOSER, CHRISTOPHER Performed by: anesthesiologist   Preanesthetic Checklist Completed: patient identified, surgical consent, pre-op evaluation, timeout performed, IV checked, risks and benefits discussed and monitors and equipment checked  Epidural Patient position: sitting Prep: DuraPrep Patient monitoring: heart rate, cardiac monitor, continuous pulse ox and blood pressure Approach: midline Location: L3-L4 Injection technique: LOR saline  Needle:  Needle type: Tuohy  Needle gauge: 17 G Needle length: 9 cm Needle insertion depth: 8 cm Catheter type: closed end flexible Catheter size: 19 Gauge Catheter at skin depth: 14 cm Test dose: negative and 2% lidocaine with Epi 1:200 K  Assessment Events: blood not aspirated, injection not painful, no injection resistance, negative IV test and no paresthesia  Additional Notes Reason for block:procedure for pain   

## 2015-08-29 NOTE — Lactation Note (Signed)
This note was copied from the chart of Loretta Suhayla Sliger. Lactation Consultation Note  Initial Consultation for 15 hour old "Loretta Moore". Mom is G1. Baby has had attempts at BF with little success since birth. Infant has had 4 voids and 4 stools since birth. Mom's nurse assisted mom with hand expressing and spoon fed infant 2.5 cc colostrum. Mom reported that infant cues to feed and then falls asleep at the breast. Moms nurse has set her up with a DEBP and mom to pump after shower. Enc. Mom to feed 8-12 x in 24 hours at first feeding cues. Enc. Mo to pump q 3 hours if infant wont latch. Enc. Mom to keep I/O records. [facility] handouts given with [facility] phone #. Informed mom of OP services, Support groups, and BF Resources. Enc. Mom to call prn.   Patient Name: Loretta Moore [id number] Date: 08/29/2015 Reason for consult: Initial assessment   Maternal Data Has patient been taught Hand Expression?: Yes Does the patient have breastfeeding experience prior to this delivery?: No  Feeding Feeding Type: Breast Milk Length of feed: 0 min (no latch)  LATCH Score/Interventions Latch: Too sleepy or reluctant, no latch achieved, no sucking elicited.                    Lactation Tools Discussed/Used     Consult Status Consult Status: Follow-up Date: 08/30/15 Follow-up type: In-patient    Loretta Moore 08/29/2015, 8:02 PM

## 2015-08-29 NOTE — Transfer of Care (Signed)
Immediate Anesthesia Transfer of Care Note  Patient: Education officer, environmental  Procedure(s) Performed: Procedure(s): CESAREAN SECTION (N/A)  Patient Location: PACU  Anesthesia Type:Regional  Level of Consciousness: awake, alert  and oriented  Airway & Oxygen Therapy: Patient Spontanous Breathing  Post-op Assessment: Report given to RN and Post -op Vital signs reviewed and stable  Post vital signs: Reviewed and stable  Last Vitals:  Filed Vitals:   08/29/15 0400  BP: 130/110  Pulse:   Temp:   Resp:     Complications: No apparent anesthesia complications

## 2015-08-29 NOTE — Anesthesia Preprocedure Evaluation (Addendum)
Anesthesia Evaluation  Patient identified by MRN, date of birth, ID band Patient awake    Reviewed: Allergy & Precautions, NPO status , Patient's Chart, lab work & pertinent test results  History of Anesthesia Complications Negative for: history of anesthetic complications  Airway Mallampati: III  TM Distance: >3 FB Neck ROM: Full    Dental  (+) Teeth Intact   Pulmonary neg shortness of breath, neg sleep apnea, neg COPD, neg recent URI, former smoker,    breath sounds clear to auscultation       Cardiovascular negative cardio ROS   Rhythm:Regular     Neuro/Psych negative neurological ROS  negative psych ROS   GI/Hepatic Neg liver ROS, GERD  Medicated and Controlled,  Endo/Other  Morbid obesity  Renal/GU negative Renal ROS     Musculoskeletal   Abdominal   Peds  Hematology  (+) anemia ,   Anesthesia Other Findings   Reproductive/Obstetrics (+) Pregnancy                            Anesthesia Physical Anesthesia Plan  ASA: II  Anesthesia Plan: Epidural   Post-op Pain Management:    Induction:   Airway Management Planned:   Additional Equipment:   Intra-op Plan:   Post-operative Plan:   Informed Consent: I have reviewed the patients History and Physical, chart, labs and discussed the procedure including the risks, benefits and alternatives for the proposed anesthesia with the patient or authorized representative who has indicated his/her understanding and acceptance.   Dental advisory given  Plan Discussed with: Anesthesiologist  Anesthesia Plan Comments:         Anesthesia Quick Evaluation

## 2015-08-29 NOTE — Op Note (Signed)
Cesarean Section Operative Report  Loretta Moore  08/28/2015 - 08/29/2015  Indications: Fetal Distress   Pre-operative Diagnosis: fetal intolerance to labor.   Post-operative Diagnosis: Same   Surgeon: Surgeon(s) and Role:    * Levie Heritage, DO - Primary    * Kathrynn Running, MD - Resident - Assisting   Attending Attestation: I was present and scrubbed for the entire procedure.   Assistants: none  Anesthesia: epidural,    Estimated Blood Loss: 800 ml  Total IV Fluids: 1500 ml LR   Urine Output: 50 cc cloudy urine, initially pink-tinged, clearing toward end of surgery  Specimens: none  Findings: Viable  infant in cephalic (OP) presentation. Apgars 9/9, weight 3351 g. Arterial cord pH not obtained. Clear amniotic fluid. Intact placenta, three vessel cord. Normal uterus.  Baby condition / location:  Couplet care / Skin to Skin     Complications: no complications  Indications: Loretta Moore is a 19 y.o. G1P1000 with an IUP [redacted]w[redacted]d presenting for postdates IOL. See progress note for detailed labor course. In brief, patient with persistent category 2 strip; specifically, persistent deep late decels with contractions that did not resolve with repositioning, IV fluids, oxygen, amnioinfusion, or terbutaline x2.  The risks, benefits, complications, treatment options, and expected outcomes were discussed with the patient . The patient concurred with the proposed plan, giving informed consent. identified as Education officer, environmental and the procedure verified as C-Section Delivery.  Procedure Details: A Time Out was held and the above information confirmed.  The patient was taken back to the operative suite where epidural anesthesia was dosed.  After induction of anesthesia, the patient was draped and prepped in the usual sterile manner and placed in a dorsal supine position with a leftward tilt. A transverse incision was in the Joel-Coehn manner to avoid scarring from patient's  hydradenitis suppurativa. The incision was carried down through the subcutaneous tissue to the fascia. Fascial incision was made and extended transversely. The fascia was separated from the underlying rectus tissue superiorly and inferiorly. The peritoneum was identified and entered bluntly. Alexis retractor was placed. A low transverse uterine incision was made. Delivered from cephalic presentation was a 3351 gram Female with Apgar scores of 9 at one minute and 9 at five minutes. Cord ph was not sent the umbilical cord was clamped and cut cord blood was obtained for evaluation. The placenta was removed Intact and appeared normal.  The uterine incision was closed with running locked sutures of 0Vicryl with an imbricating layer of the same.   Hemostasis was observed. . The fascia was then reapproximated with running sutures of 0Vicryl. On the patient's left, for approximately 3 cm, a layer of the fascia was missed on the initial closure, so we returned to that portion of the fascia after closure of the rest and closed that remaining fascial layer with 0 vicryl, running. The subcuticular closure was performed using 2-0plain gut. The skin was closed with 4-0Vicryl. Wound vac was placed.  Instrument, sponge, and needle counts were correct prior the abdominal closure and were correct at the conclusion of the case.     Disposition: PACU - hemodynamically stable.   Maternal Condition: stable       Signed: Lavonne Chick 08/29/2015 7:31 AM

## 2015-08-29 NOTE — Progress Notes (Signed)
LABOR PROGRESS NOTE  Loretta Moore is a 19 y.o. G1P0 at [redacted]w[redacted]d  admitted for PDIOL.  Subjective: Mild pain after epidural. No fever.  Objective: BP 126/86 mmHg  Pulse 75  Temp(Src) 97.8 F (36.6 C) (Oral)  Resp 18  Ht 5' (1.524 m)  Wt 252 lb (114.306 kg)  BMI 49.22 kg/m2  LMP 11/20/2014 or  Filed Vitals:   08/28/15 1625 08/28/15 1836 08/28/15 2102 08/29/15 0311  BP: 127/72 141/85 127/66 126/86  Pulse: 80 87 81 75  Temp: 97.3 F (36.3 C) 98 F (36.7 C) 97.8 F (36.6 C)   TempSrc: Oral Oral Oral   Resp:  20 18   Height:      Weight:        5/75/-2  Labs: Lab Results  Component Value Date   WBC 8.9 08/28/2015   HGB 11.7* 08/28/2015   HCT 34.5* 08/28/2015   MCV 87.8 08/28/2015   PLT 302 08/28/2015    Patient Active Problem List   Diagnosis Date Noted  . Post term pregnancy 08/28/2015  . Asthma 04/29/2015  . Smoker 04/29/2015  . Chlamydia 02/05/2015  . Hidradenitis suppurativa   . Hard of hearing   . Supervision of normal first pregnancy 12/24/2014    Assessment / Plan: 19 y.o. G1P0 at [redacted]w[redacted]d here for PDIOL. Now with recurrent deep late decels. S/p terbutaline. Has not received pitocin.  Labor: decels terminated after administration of terbutaline. Will allow uterus to rest, and if HR tolerates, progress. SROM 00:00. IUPC and FSE in place. Fetal Wellbeing:  Category 2 as above Pain Control:  S/p epidural   Silvano Bilis, MD 08/29/2015, 3:17 AM

## 2015-08-29 NOTE — Anesthesia Postprocedure Evaluation (Signed)
  Anesthesia Post-op Note  Patient: Education officer, environmental  Procedure(s) Performed: Procedure(s): CESAREAN SECTION (N/A)  Patient is awake, responsive, moving her legs, and has signs of resolution of her numbness. Pain and nausea are reasonably well controlled. Vital signs are stable and clinically acceptable. Oxygen saturation is clinically acceptable. There are no apparent anesthetic complications at this time. Patient is ready for discharge.

## 2015-08-29 NOTE — Progress Notes (Addendum)
Patient ID: Loretta Moore, female   DOB: 1996/05/12, 19 y.o.   MRN: [id number]  Patient having repetitive variable decelerations decelerations with every contractions with FHR down to 60-70.  Returns to baseline.  Resolved briefly with terbutaline, but returned.  Amnioinfusion started, second dose of terbutaline given.  The risks of cesarean section discussed with the patient included but were not limited to: bleeding which may require transfusion or reoperation; infection which may require antibiotics; injury to bowel, bladder, ureters or other surrounding organs; injury to the fetus; need for additional procedures including hysterectomy in the event of a life-threatening hemorrhage; placental abnormalities wth subsequent pregnancies, incisional problems, thromboembolic phenomenon and other postoperative/anesthesia complications. The patient concurred with the proposed plan, giving informed written consent for the procedure.   Patient has been NPO since this morning.  She will remain NPO for procedure. Anesthesia and OR aware.  Preoperative prophylactic clindamycin and gentamycin ordered on call to the OR.  To OR when ready.  Cervical exam: Dilation: 5 Effacement (%): 90 Cervical Position: Posterior Station: -3 Presentation: Vertex Exam by:: [email or web address] Cervical exam repeated by me - remains unchanged.  Levie Heritage, DO 08/29/2015 4:02 AM

## 2015-08-29 NOTE — Addendum Note (Signed)
Addendum  created 08/29/15 1810 by Angela Adam, CRNA   Modules edited: Notes Section   Notes Section:  File: [id number]

## 2015-08-29 NOTE — Anesthesia Postprocedure Evaluation (Signed)
  Anesthesia Post-op Note  Patient: Education officer, environmental  Procedure(s) Performed: Procedure(s): CESAREAN SECTION (N/A)  Patient Location: Mother/Baby  Anesthesia Type:Epidural  Level of Consciousness: awake and alert   Airway and Oxygen Therapy: Patient Spontanous Breathing  Post-op Pain: mild  Post-op Assessment: Post-op Vital signs reviewed, Patient's Cardiovascular Status Stable, Respiratory Function Stable, No signs of Nausea or vomiting, Pain level controlled, No headache, Spinal receding and Patient able to bend at knees Post-op Vital Signs: Reviewed  Last Vitals:  Filed Vitals:   08/29/15 1355  BP:   Pulse:   Temp: 37.1 C  Resp: 16    Complications: No apparent anesthesia complications

## 2015-08-30 LAB — CBC
HEMATOCRIT: 26.2 % — AB (ref 36.0–46.0)
HEMOGLOBIN: 8.7 g/dL — AB (ref 12.0–15.0)
MCH: 29.5 pg (ref 26.0–34.0)
MCHC: 33.2 g/dL (ref 30.0–36.0)
MCV: 88.8 fL (ref 78.0–100.0)
Platelets: 210 10*3/uL (ref 150–400)
RBC: 2.95 MIL/uL — AB (ref 3.87–5.11)
RDW: 15.1 % (ref 11.5–15.5)
WBC: 10.7 10*3/uL — ABNORMAL HIGH (ref 4.0–10.5)

## 2015-08-30 NOTE — Progress Notes (Signed)
Post Op Day 1 Subjective:  Loretta Moore is a 19 y.o. G1P1000 [redacted]w[redacted]d s/p LTCS for fetal intolerance of labor.  No acute events overnight.  Pt denies problems with ambulating, voiding or po intake.  She denies nausea or vomiting.  Pain is well controlled.  She has had flatus. She has not had bowel movement.  Lochia Small.  Plan for birth control is IUD.  Method of Feeding: breast  Objective: Blood pressure 119/50, pulse 85, temperature 98 F (36.7 C), temperature source Oral, resp. rate 18, height 5' (1.524 m), weight 252 lb (114.306 kg), last menstrual period 11/20/2014, SpO2 100 %, unknown if currently breastfeeding.  Physical Exam:  General: alert, cooperative and no distress Lochia:normal flow Chest: CTAB Heart: RRR no m/r/g Abdomen: +BS, soft, nontender, incision healing well. Uterine Fundus: firm, below level of umbilicus DVT Evaluation: No evidence of DVT seen on physical exam. Extremities: no edema   Recent Labs  08/28/15 0235 08/30/15 0545  HGB 11.7* 8.7*  HCT 34.5* 26.2*    Assessment/Plan:  ASSESSMENT: [name] is a 19 y.o. G1P1000 [redacted]w[redacted]d s/p LTCS for fetal intolerance of labor  Plan for discharge tomorrow, Breastfeeding, Lactation consult and Contraception IUD   LOS: 2 days   [name] 08/30/2015, 6:19 AM   Seen by me also Agree with note Filed Vitals:   08/29/15 1817 08/29/15 2130 08/30/15 0130 08/30/15 0625  BP: 125/54 131/62 119/50 117/65  Pulse: 105 81 85 80  Temp: 98.2 F (36.8 C) 97.8 F (36.6 C) 98 F (36.7 C) 98 F (36.7 C)  TempSrc: Oral Oral Oral   Resp: Height:      Weight:      SpO2: 98% 100% 100% 100%   Very sleepy Incision clean and intact with wound vac operating Ext WNL Plan to increase ambulation and advance diet today. Continue present care Aviva Signs, CNM

## 2015-08-31 ENCOUNTER — Encounter (HOSPITAL_COMMUNITY): Payer: Self-pay | Admitting: Family Medicine

## 2015-08-31 NOTE — Lactation Note (Signed)
This note was copied from the chart of Loretta Che Cunnington. Lactation Consultation Note Mom called for LC to come assist w/latch. Stated baby had been at the breast most of the day and frustrated d/t wanting to BF but acts like he forgot how. Explained newborn behavior.  Mom has wide space breast that appears to have ? Connective tissue to bilatteral breast. Lt. Breast has less breast tissue than Rt. Breast. Has puffy areolas w/everted nipple and compressible to obtain a deep latch. Hand expressed transitional white milk. Instant swallows heard. Weight loss could be from large out put. Had 9 voids, 8 stools, at 40 hrs. Of age. Discussed milk transfer, breast massage during BF, supply and demand, and cluster feeding.  Mom has DEBP at bedside. Latch appeared good, encouraged to use positioners to keep cheeks to breast for deep latch and good milk transfer. Patient Name: Loretta Moore [id number] Date: 08/31/2015 Reason for consult: Follow-up assessment;Infant weight loss   Maternal Data    Feeding Feeding Type: Breast Fed Length of feed: 15 min (still BF)  LATCH Score/Interventions Latch: Grasps breast easily, tongue down, lips flanged, rhythmical sucking. Intervention(s): Breast massage;Adjust position;Assist with latch;Breast compression  Audible Swallowing: Spontaneous and intermittent Intervention(s): Hand expression Intervention(s): Hand expression;Alternate breast massage  Type of Nipple: Everted at rest and after stimulation  Comfort (Breast/Nipple): Soft / non-tender     Hold (Positioning): Assistance needed to correctly position infant at breast and maintain latch. Intervention(s): Skin to skin;Position options;Support Pillows;Breastfeeding basics reviewed  LATCH Score: 9  Lactation Tools Discussed/Used Tools: Pump Breast pump type: Double-Electric Breast Pump   Consult Status Consult Status: Follow-up Date: 09/01/15 Follow-up type: In-patient    CARVER, Diamond Nickel 08/31/2015, 4:07 AM

## 2015-08-31 NOTE — Progress Notes (Signed)
Subjective: Postpartum Day 2: Cesarean Delivery Patient reports incisional pain, tolerating PO, + flatus and no problems voiding.    Objective: Vital signs in last 24 hours: Temp:  [98 F (36.7 C)] 98 F (36.7 C) (09/25 1909) Pulse Rate:  [80-100] 100 (09/25 1909) Resp:  [16-18] 16 (09/25 1909) BP: (117-125)/(51-65) 125/51 mmHg (09/25 1909) SpO2:  [100 %] 100 % (09/25 1909)  Physical Exam:  General: alert, cooperative, appears stated age and no distress Lochia: appropriate Uterine Fundus: firm Incision: healing well, no significant drainage, no dehiscence, no significant erythema DVT Evaluation: No evidence of DVT seen on physical exam. Negative Homan's sign. No cords or calf tenderness.   Recent Labs  08/30/15 0545  HGB 8.7*  HCT 26.2*    Assessment/Plan: Status post Cesarean section. Doing well postoperatively.  Continue current care.  Wyvonnia Dusky DARLENE 08/31/2015, 5:38 AM

## 2015-08-31 NOTE — Lactation Note (Signed)
This note was copied from the chart of Loretta Sienna Mcbane. Lactation Consultation Note Baby is 29 hours old and has not had a BM in 24 hours but has voided twice in the past 5 hours.  Assisted mom with latching baby. Breast compression and massage were used to help him transfer. Swallows were heard but he needed stimulation at times to swallow.  Encouraged mom to post pump every other feeding and hand expression to bring milk to volume. Follow-up tomorrow.  Patient Name: Loretta Moore [id number] Date: 08/31/2015     Maternal Data    Feeding Feeding Type: Breast Fed Length of feed: 15 min  LATCH Score/Interventions Latch: Repeated attempts needed to sustain latch, nipple held in mouth throughout feeding, stimulation needed to elicit sucking reflex.  Audible Swallowing: A few with stimulation Intervention(s):  (breat compression)  Type of Nipple: Everted at rest and after stimulation  Comfort (Breast/Nipple): Soft / non-tender     Hold (Positioning): No assistance needed to correctly position infant at breast.  LATCH Score: 8  Lactation Tools Discussed/Used     Consult Status      Soyla Dryer 08/31/2015, 5:52 PM

## 2015-09-01 DIAGNOSIS — Z98891 History of uterine scar from previous surgery: Secondary | ICD-10-CM

## 2015-09-01 MED ORDER — IBUPROFEN 600 MG PO TABS: 600.0000 mg | ORAL_TABLET | Freq: Four times a day (QID) | ORAL | Status: DC

## 2015-09-01 MED ORDER — OXYCODONE-ACETAMINOPHEN 5-325 MG PO TABS: 1.0000 | ORAL_TABLET | ORAL | Status: DC | PRN

## 2015-09-01 NOTE — Discharge Summary (Signed)
OB Discharge Summary     Patient Name: Loretta Moore DOB: 1996-07-08 MRN: [id number]  Date of admission: 08/28/2015 Delivering MD: Levie Heritage   Date of discharge: 09/01/2015  Admitting diagnosis: INDUCTION Intrauterine pregnancy: [redacted]w[redacted]d     Secondary diagnosis: Asthma     Discharge diagnosis: Term Pregnancy Delivered                                                                                               Augmentation: Pitocin and Cytotec  Complications: None  Hospital course:  Onset of Labor With Unplanned C/S  19 y.o. yo G1P1000 at [redacted]w[redacted]d was admitted in Latent Labor on 08/28/2015. Patient had a labor course significant for fetal intolerance. Membrane Rupture Time/Date: 11:25 PM ,08/28/2015   The patient went for cesarean section due to Non-Reassuring FHR, and delivered a Viable infant,08/29/2015  Details of operation can be found in separate operative note. Patient had an uncomplicated postpartum course.  She is ambulating,tolerating a regular diet, passing flatus, and urinating well.  Patient is discharged home in stable condition. Patient has disposable wound vac that she will remove and dispose of 1 week post-op.  Physical exam  Filed Vitals:   08/30/15 0625 08/30/15 1909 08/31/15 0519 08/31/15 1722  BP: 117/65 125/51 131/64 142/58  Pulse: 80 100 77 88  Temp: 98 F (36.7 C) 98 F (36.7 C) 97.5 F (36.4 C) 98.1 F (36.7 C)  TempSrc:   Axillary Oral  Resp: Height:      Weight:      SpO2: 100% 100%  100%   General: alert, cooperative and no distress Lochia: appropriate Uterine Fundus: firm Incision: Healing well with no significant drainage, No significant erythema, Dressing is clean, dry, and intact DVT Evaluation: No evidence of DVT seen on physical exam. Labs: Lab Results  Component Value Date   WBC 10.7* 08/30/2015   HGB 8.7* 08/30/2015   HCT 26.2* 08/30/2015   MCV 88.8 08/30/2015   PLT 210 08/30/2015   No flowsheet data  found.  Discharge instruction: per After Visit Summary and "Baby and Me Booklet".  Medications:  Current facility-administered medications:  .  acetaminophen (TYLENOL) tablet 650 mg, 650 mg, Oral, Q4H PRN, Kathrynn Running, MD .  witch hazel-glycerin (TUCKS) pad 1 application, 1 application, Topical, PRN **AND** dibucaine (NUPERCAINAL) 1 % rectal ointment 1 application, 1 application, Rectal, PRN, Kathrynn Running, MD .  diphenhydrAMINE (BENADRYL) injection 12.5 mg, 12.5 mg, Intravenous, Q4H PRN, 12.5 mg at 08/29/15 0915 **OR** diphenhydrAMINE (BENADRYL) capsule 25 mg, 25 mg, Oral, Q4H PRN, Val Eagle, MD, 25 mg at 08/30/15 0138 .  diphenhydrAMINE (BENADRYL) capsule 25 mg, 25 mg, Oral, Q6H PRN, Kathrynn Running, MD .  famotidine (PEPCID) tablet 40 mg, 40 mg, Oral, Daily, Federico Flake, MD, 40 mg at 08/31/15 1035 .  ibuprofen (ADVIL,MOTRIN) tablet 600 mg, 600 mg, Oral, 4 times per day, Kathrynn Running, MD, 600 mg at 09/01/15 0610 .  lactated ringers infusion, , Intravenous, Continuous, Kathrynn Running, MD .  lanolin ointment 1 application, 1 application, Topical, PRN,  Kathrynn Running, MD .  menthol-cetylpyridinium (CEPACOL) lozenge 3 mg, 1 lozenge, Oral, Q2H PRN, Kathrynn Running, MD .  nalbuphine (NUBAIN) injection 5 mg, 5 mg, Intravenous, Q4H PRN **OR** nalbuphine (NUBAIN) injection 5 mg, 5 mg, Subcutaneous, Q4H PRN, Val Eagle, MD, 5 mg at 08/29/15 1359 .  nalbuphine (NUBAIN) injection 5 mg, 5 mg, Intravenous, Once PRN **OR** nalbuphine (NUBAIN) injection 5 mg, 5 mg, Subcutaneous, Once PRN, Val Eagle, MD .  naloxone [facility]) 2 mg in dextrose 5 % 250 mL infusion, 1-4 mcg/kg/hr, Intravenous, Continuous PRN, Val Eagle, MD .  naloxone [facility]) injection 0.4 mg, 0.4 mg, Intravenous, PRN **AND** sodium chloride 0.9 % injection 3 mL, 3 mL, Intravenous, PRN, Val Eagle, MD .  ondansetron [facility]) injection 4 mg, 4 mg, Intravenous, Q8H PRN,  Val Eagle, MD .  oxyCODONE-acetaminophen (PERCOCET/ROXICET) 5-325 MG per tablet 1 tablet, 1 tablet, Oral, Q4H PRN, Kathrynn Running, MD, 1 tablet at 08/31/15 1657 .  oxyCODONE-acetaminophen (PERCOCET/ROXICET) 5-325 MG per tablet 2 tablet, 2 tablet, Oral, Q4H PRN, Kathrynn Running, MD, 2 tablet at 09/01/15 0311 .  prenatal multivitamin tablet 1 tablet, 1 tablet, Oral, Q1200, Kathrynn Running, MD, 1 tablet at 08/31/15 1404 .  scopolamine (TRANSDERM-SCOP) 1 MG/3DAYS 1.5 mg, 1 patch, Transdermal, Once, Val Eagle, MD .  senna-docusate (Senokot-S) tablet 2 tablet, 2 tablet, Oral, Q24H, Kathrynn Running, MD, 2 tablet at 09/01/15 0013 .  simethicone (MYLICON) chewable tablet 80 mg, 80 mg, Oral, TID PC, Kathrynn Running, MD, 80 mg at 08/31/15 1901 .  simethicone (MYLICON) chewable tablet 80 mg, 80 mg, Oral, Q24H, Kathrynn Running, MD, 80 mg at 09/01/15 0013 .  simethicone (MYLICON) chewable tablet 80 mg, 80 mg, Oral, PRN, Kathrynn Running, MD .  Tdap (BOOSTRIX) injection 0.5 mL, 0.5 mL, Intramuscular, Once, [facility], MD .  zolpidem [facility]) tablet 5 mg, 5 mg, Oral, QHS PRN, Kathrynn Running, MD  Diet: routine diet  Activity: Advance as tolerated. Pelvic rest for 6 weeks.   Outpatient follow up:6 weeks  Postpartum contraception: IUD Mirena  Newborn Data: Live born female  Birth Weight: 7 lb 6.2 oz (3351 g) APGAR: 9, 9  Baby Feeding: Breast Disposition:home with mother   09/01/2015 Tarri Abernethy, MD PGY-1 [facility]   OB FELLOW HISTORY AND PHYSICAL ATTESTATION  I have seen and examined this patient; I agree with above documentation in the resident's note.   Cherrie Gauze Wouk 09/01/2015, 7:39 AM

## 2015-09-01 NOTE — Lactation Note (Signed)
This note was copied from the chart of Loretta Moore. Lactation Consultation Note  Patient Name: Loretta Aarthi Uyeno RUEAV'W Date: 09/01/2015 Reason for consult: Follow-up assessment;Infant weight loss Baby is 78 hour old , 11% weight loss . EBM 10 ml spoon fed .  Moms milk is in and she is post pumping and EBM yield another 10 ml. Breastfeeding consistently per mom , doc flow sheets, and [facility] RN .  @ consult baby latched well with depth , multiply swallows , increased with breast compressions, mom comfortable. Latch score =9 , have been 8 's. Breast feeding range 15 -50 mins , 12 voids, 9 stools. At 66 hours - Bili check 7.5. F/U with Pedis Tomorrow for weight check , [facility] Thursday , Friday - smart start. Per [facility] RN and mom , Pedis order to supplement with EBM and formula until weight checks. LC showed mom how to sue the DEBP set up manually until [facility] apt Thursday. Sore nipple and engorgement prevention and tx reviewed.  Mother informed of post-discharge support and given phone number to the lactation department , including services for phone call assistance; out-patient appointments; and breastfeeding support group.  List of other breastfeeding resources in the community given in the handout. Encouraged mother to call for  problems or concerns related to breastfeeding. LC showed mom how to use the DEBP set up manually   Maternal Data Has patient been taught Hand Expression?: Yes  Feeding Feeding Type: Breast Fed  LATCH Score/Interventions Latch: Grasps breast easily, tongue down, lips flanged, rhythmical sucking. Intervention(s): Skin to skin;Teach feeding cues;Waking techniques Intervention(s): Adjust position;Assist with latch;Breast massage;Breast compression  Audible Swallowing: Spontaneous and intermittent  Type of Nipple: Everted at rest and after stimulation  Comfort (Breast/Nipple): Filling, red/small blisters or bruises, mild/mod discomfort  Problem noted:  Filling  Hold (Positioning): Assistance needed to correctly position infant at breast and maintain latch. Intervention(s): Breastfeeding basics reviewed;Support Pillows;Position options;Skin to skin  LATCH Score: 8  Lactation Tools Discussed/Used WIC Program: Yes   Consult Status Consult Status: Complete Date: 09/01/15    Kathrin Greathouse 09/01/2015, 11:36 AM

## 2015-09-01 NOTE — Discharge Instructions (Signed)

## 2015-09-07 ENCOUNTER — Encounter: Payer: Self-pay | Admitting: Women's Health

## 2015-09-07 ENCOUNTER — Ambulatory Visit (INDEPENDENT_AMBULATORY_CARE_PROVIDER_SITE_OTHER): Payer: Medicaid Other | Admitting: Women's Health

## 2015-09-07 VITALS — BP 138/68 | HR 76 | Wt 235.0 lb

## 2015-09-07 DIAGNOSIS — Z5189 Encounter for other specified aftercare: Secondary | ICD-10-CM | POA: Diagnosis not present

## 2015-09-07 NOTE — Progress Notes (Signed)
Patient ID: Loretta Moore, female   DOB: 17-Jul-1996, 19 y.o.   MRN: [id number]   [facility] ObGyn Clinic Visit  Patient name: Loretta Moore MRN [id number]  Date of birth: 1996/07/24  CC & HPI:  Loretta Moore is a 19 y.o. G21P1001 African American female presenting today for incision f/u.  9d s/p PLTCS for NRFHR, had vound vac- pt states it came off 10/1. Breastfeeding- pediatrician recommended supplementing today d/t baby losing weight. Pt felt her supply was adequate, but does report that baby would act hungry after nursing.  No LMP recorded.  Pertinent History Reviewed:  Medical & Surgical Hx:   Past Medical History  Diagnosis Date  . Anemia   . Asthma   . Hidradenitis suppurativa     had excisions in axilla and groin  . Pregnant 12/24/2014  . Hard of hearing    Past Surgical History  Procedure Laterality Date  . Axillary hidradenitis excision    . Inguinal hidradenitis excision    . Tonsillectomy and adenoidectomy    . Cesarean section N/A 08/29/2015    Procedure: CESAREAN SECTION;  Surgeon: Levie Heritage, DO;  Location: [facility] OR[facility];  Service: Obstetrics;  Laterality: N/A;   Medications: Reviewed & Updated - see associated section Social History: Reviewed -  reports that she has been smoking Cigarettes.  She has a .75 pack-year smoking history. She has never used smokeless tobacco.  Objective Findings:  Vitals: BP 138/68 mmHg  Pulse 76  Wt 235 lb (106.595 kg)  Breastfeeding? Yes Body mass index is 45.9 kg/(m^2).  Physical Examination: General appearance - alert, well appearing, and in no distress Abdomen - soft, nontender, wound vac dressing removed, c/s incision well approximated, healing well, no s/s infection, no drainage  No results found for this or any previous visit (from the past 24 hour(s)).   Assessment & Plan:  A:   9d s/p PLTCS  Incision check  Breastfeeding, decreased supply  P:  Keep incision clean/dry  Gave printed tips to help increase milk  supply  Return for As scheduled. for pp visit  Marge Duncans CNM, [facility] 09/07/2015 2:30 PM

## 2015-09-07 NOTE — Patient Instructions (Signed)
Tips To Increase Milk Supply  Lots of water! Enough so that your urine is clear  Plenty of calories, if you're not getting enough calories, your milk supply can decrease  Breastfeed/pump often, every 2-3 hours x 20-30mins  Fenugreek 3 pills 3 times a day, this may make your urine smell like maple syrup  Mother's Milk Tea  Lactation cookies, google for the recipe  Real oatmeal   

## 2015-09-09 ENCOUNTER — Ambulatory Visit: Payer: Self-pay

## 2015-10-05 ENCOUNTER — Encounter: Payer: Self-pay | Admitting: Women's Health

## 2015-10-05 ENCOUNTER — Ambulatory Visit: Payer: Self-pay

## 2015-10-05 ENCOUNTER — Ambulatory Visit (INDEPENDENT_AMBULATORY_CARE_PROVIDER_SITE_OTHER): Payer: Medicaid Other | Admitting: Women's Health

## 2015-10-05 DIAGNOSIS — D649 Anemia, unspecified: Secondary | ICD-10-CM

## 2015-10-05 DIAGNOSIS — O9903 Anemia complicating the puerperium: Secondary | ICD-10-CM | POA: Diagnosis not present

## 2015-10-05 DIAGNOSIS — Z862 Personal history of diseases of the blood and blood-forming organs and certain disorders involving the immune mechanism: Secondary | ICD-10-CM

## 2015-10-05 LAB — POCT HEMOGLOBIN: Hemoglobin: 9.9 g/dL — AB (ref 12.2–16.2)

## 2015-10-05 MED ORDER — MEDROXYPROGESTERONE ACETATE 150 MG/ML IM SUSP: 150.0000 mg | INTRAMUSCULAR | Status: DC

## 2015-10-05 MED ORDER — FERROUS SULFATE 325 (65 FE) MG PO TABS: 325.0000 mg | ORAL_TABLET | Freq: Two times a day (BID) | ORAL | Status: DC

## 2015-10-05 NOTE — Addendum Note (Signed)
Addended by: Shawna ClampBOOKER, Akili Cuda R on: 10/05/2015 04:01 PM   Modules accepted: Orders, Medications

## 2015-10-05 NOTE — Patient Instructions (Signed)
Constipation  Drink plenty of fluid, preferably water, throughout the day  Eat foods high in fiber such as fruits, vegetables, and grains  Exercise, such as walking, is a good way to keep your bowels regular  Drink warm fluids, especially warm prune juice, or decaf coffee  Eat a 1/2 cup of real oatmeal (not instant), 1/2 cup applesauce, and 1/2-1 cup warm prune juice every day  If needed, you may take Colace (docusate sodium) stool softener once or twice a day to help keep the stool soft. If you are pregnant, wait until you are out of your first trimester (12-14 weeks of pregnancy)  If you still are having problems with constipation, you may take Miralax once daily as needed to help keep your bowels regular.  If you are pregnant, wait until you are out of your first trimester (12-14 weeks of pregnancy)   Medroxyprogesterone injection [Contraceptive] What is this medicine? MEDROXYPROGESTERONE (me DROX ee proe JES te rone) contraceptive injections prevent pregnancy. They provide effective birth control for 3 months. Depo-subQ Provera 104 is also used for treating pain related to endometriosis. This medicine may be used for other purposes; ask your health care provider or pharmacist if you have questions. What should I tell my health care provider before I take this medicine? They need to know if you have any of these conditions: -frequently drink alcohol -asthma -blood vessel disease or a history of a blood clot in the lungs or legs -bone disease such as osteoporosis -breast cancer -diabetes -eating disorder (anorexia nervosa or bulimia) -high blood pressure -HIV infection or AIDS -kidney disease -liver disease -mental depression -migraine -seizures (convulsions) -stroke -tobacco smoker -vaginal bleeding -an unusual or allergic reaction to medroxyprogesterone, other hormones, medicines, foods, dyes, or preservatives -pregnant or trying to get pregnant -breast-feeding How  should I use this medicine? Depo-Provera Contraceptive injection is given into a muscle. Depo-subQ Provera 104 injection is given under the skin. These injections are given by a health care professional. You must not be pregnant before getting an injection. The injection is usually given during the first 5 days after the start of a menstrual period or 6 weeks after delivery of a baby. Talk to your pediatrician regarding the use of this medicine in children. Special care may be needed. These injections have been used in female children who have started having menstrual periods. Overdosage: If you think you have taken too much of this medicine contact a poison control center or emergency room at once. NOTE: This medicine is only for you. Do not share this medicine with others. What if I miss a dose? Try not to miss a dose. You must get an injection once every 3 months to maintain birth control. If you cannot keep an appointment, call and reschedule it. If you wait longer than 13 weeks between Depo-Provera contraceptive injections or longer than 14 weeks between Depo-subQ Provera 104 injections, you could get pregnant. Use another method for birth control if you miss your appointment. You may also need a pregnancy test before receiving another injection. What may interact with this medicine? Do not take this medicine with any of the following medications: -bosentan This medicine may also interact with the following medications: -aminoglutethimide -antibiotics or medicines for infections, especially rifampin, rifabutin, rifapentine, and griseofulvin -aprepitant -barbiturate medicines such as phenobarbital or primidone -bexarotene -carbamazepine -medicines for seizures like ethotoin, felbamate, oxcarbazepine, phenytoin, topiramate -modafinil -St. John's wort This list may not describe all possible interactions. Give your health care provider a  list of all the medicines, herbs, non-prescription drugs,  or dietary supplements you use. Also tell them if you smoke, drink alcohol, or use illegal drugs. Some items may interact with your medicine. What should I watch for while using this medicine? This drug does not protect you against HIV infection (AIDS) or other sexually transmitted diseases. Use of this product may cause you to lose calcium from your bones. Loss of calcium may cause weak bones (osteoporosis). Only use this product for more than 2 years if other forms of birth control are not right for you. The longer you use this product for birth control the more likely you will be at risk for weak bones. Ask your health care professional how you can keep strong bones. You may have a change in bleeding pattern or irregular periods. Many females stop having periods while taking this drug. If you have received your injections on time, your chance of being pregnant is very low. If you think you may be pregnant, see your health care professional as soon as possible. Tell your health care professional if you want to get pregnant within the next year. The effect of this medicine may last a long time after you get your last injection. What side effects may I notice from receiving this medicine? Side effects that you should report to your doctor or health care professional as soon as possible: -allergic reactions like skin rash, itching or hives, swelling of the face, lips, or tongue -breast tenderness or discharge -breathing problems -changes in vision -depression -feeling faint or lightheaded, falls -fever -pain in the abdomen, chest, groin, or leg -problems with balance, talking, walking -unusually weak or tired -yellowing of the eyes or skin Side effects that usually do not require medical attention (report to your doctor or health care professional if they continue or are bothersome): -acne -fluid retention and swelling -headache -irregular periods, spotting, or absent periods -temporary pain,  itching, or skin reaction at site where injected -weight gain This list may not describe all possible side effects. Call your doctor for medical advice about side effects. You may report side effects to FDA at 1-800-FDA-1088. Where should I keep my medicine? This does not apply. The injection will be given to you by a health care professional. NOTE: This sheet is a summary. It may not cover all possible information. If you have questions about this medicine, talk to your doctor, pharmacist, or health care provider.    2016, Elsevier/Gold Standard. (2008-12-12 18:37:56)

## 2015-10-05 NOTE — Progress Notes (Signed)
Patient ID: Loretta Moore, female   DOB: November 09, 1996, 19 y.o.   MRN: [id number] Subjective:    Loretta Moore is a 19 y.o. 791P1001 African American female who presents for a postpartum visit. She is 5 weeks postpartum following a primary cesarean section, low transverse incision at 41.2 gestational weeks d/t NRFHR during IOL for postdates. Anesthesia: epidural. I have fully reviewed the prenatal and intrapartum course. Postpartum course has been uncomplicated. Baby's course has been uncomplicated. Baby is feeding by breast. Bleeding bled x few wks after birth, stopped for couple weeks, now period just started yesterday. Bowel function is normal. Bladder function is normal. Patient is sexually active. Last sexual activity: 10/29. Contraception method is condoms and wants depo. Postpartum depression screening: negative. Score 0.  Last pap never, just turned 19 in Aug. Fatigued, stays cold, wants Hgb checked  The following portions of the patient's history were reviewed and updated as appropriate: allergies, current medications, past medical history, past surgical history and problem list.  Review of Systems Pertinent items are noted in HPI.   Filed Vitals:   10/05/15 1454  BP: 118/82  Pulse: 72  Weight: 228 lb (103.42 kg)   Patient's last menstrual period was 10/05/2015.  Objective:   General:  alert, cooperative and no distress   Breasts:  deferred, no complaints  Lungs: clear to auscultation bilaterally  Heart:  regular rate and rhythm  Abdomen: soft, nontender, healing well   Vulva: normal  Vagina: normal vagina  Cervix:  closed  Corpus: Well-involuted  Adnexa:  Non-palpable  Rectal Exam: No hemorrhoids        Results for orders placed or performed in visit on 10/05/15 (from the past 24 hour(s))  POCT hemoglobin     Status: Abnormal   Collection Time: 10/05/15  3:05 PM  Result Value Ref Range   Hemoglobin 9.9 (A) 12.2 - 16.2 g/dL    Assessment:   Postpartum exam 5 wks s/p  PLTCS for NRFHR during IOL for postdates Breastfeeding Depression screening Contraception counseling  Anemia  Plan:  Resume pnv (hasn't been taking), rx fe bid, increase fe-rich foods Gave printed info on constipation prevention Contraception: rx depo w/ 3RF Follow up in: this pm for depo injection, then 1mth for pap & physical   Marge DuncansBooker, Kate Sweetman Randall CNM, [facility]NP[facility]BC 10/05/2015 3:24 PM

## 2015-10-06 ENCOUNTER — Ambulatory Visit: Payer: Self-pay

## 2015-10-12 ENCOUNTER — Ambulatory Visit (INDEPENDENT_AMBULATORY_CARE_PROVIDER_SITE_OTHER): Payer: Medicaid Other | Admitting: *Deleted

## 2015-10-12 ENCOUNTER — Encounter: Payer: Self-pay | Admitting: *Deleted

## 2015-10-12 DIAGNOSIS — Z3042 Encounter for surveillance of injectable contraceptive: Secondary | ICD-10-CM

## 2015-10-12 DIAGNOSIS — Z3202 Encounter for pregnancy test, result negative: Secondary | ICD-10-CM | POA: Diagnosis not present

## 2015-10-12 LAB — POCT URINE PREGNANCY: PREG TEST UR: NEGATIVE

## 2015-10-12 MED ORDER — MEDROXYPROGESTERONE ACETATE 150 MG/ML IM SUSP
150.0000 mg | Freq: Once | INTRAMUSCULAR | Status: AC
  Administered 2015-10-12: 150 mg via INTRAMUSCULAR

## 2015-10-12 NOTE — Progress Notes (Signed)
Pt here for Depo. Pt reports no problems at this time. Return in 12 weeks for next shot. JSY

## 2016-01-04 ENCOUNTER — Encounter: Payer: Self-pay | Admitting: *Deleted

## 2016-01-04 ENCOUNTER — Ambulatory Visit (INDEPENDENT_AMBULATORY_CARE_PROVIDER_SITE_OTHER): Payer: Medicaid Other | Admitting: *Deleted

## 2016-01-04 DIAGNOSIS — Z3042 Encounter for surveillance of injectable contraceptive: Secondary | ICD-10-CM | POA: Diagnosis not present

## 2016-01-04 DIAGNOSIS — Z3202 Encounter for pregnancy test, result negative: Secondary | ICD-10-CM

## 2016-01-04 LAB — POCT URINE PREGNANCY: PREG TEST UR: NEGATIVE

## 2016-01-04 MED ORDER — MEDROXYPROGESTERONE ACETATE 150 MG/ML IM SUSP
150.0000 mg | Freq: Once | INTRAMUSCULAR | Status: AC
  Administered 2016-01-04: 150 mg via INTRAMUSCULAR

## 2016-01-04 NOTE — Progress Notes (Signed)
Pt here for Depo. Reports no problems at this time. Return in 12 weeks for next shot. JSY 

## 2016-03-28 ENCOUNTER — Encounter: Payer: Self-pay | Admitting: *Deleted

## 2016-03-28 ENCOUNTER — Ambulatory Visit (INDEPENDENT_AMBULATORY_CARE_PROVIDER_SITE_OTHER): Payer: Medicaid Other | Admitting: *Deleted

## 2016-03-28 DIAGNOSIS — Z3042 Encounter for surveillance of injectable contraceptive: Secondary | ICD-10-CM | POA: Diagnosis not present

## 2016-03-28 DIAGNOSIS — Z3202 Encounter for pregnancy test, result negative: Secondary | ICD-10-CM | POA: Diagnosis not present

## 2016-03-28 LAB — POCT URINE PREGNANCY: Preg Test, Ur: NEGATIVE

## 2016-03-28 MED ORDER — MEDROXYPROGESTERONE ACETATE 150 MG/ML IM SUSP
150.0000 mg | Freq: Once | INTRAMUSCULAR | Status: AC
  Administered 2016-03-28: 150 mg via INTRAMUSCULAR

## 2016-03-28 NOTE — Progress Notes (Signed)
Pt here for Depo. Pt tolerated shot well. Return in 12 weeks for next shot. Pt may come in around 6 weeks to discuss different birth control options. JSY

## 2016-05-09 ENCOUNTER — Ambulatory Visit: Payer: Self-pay | Admitting: Women's Health

## 2016-05-17 ENCOUNTER — Encounter: Payer: Self-pay | Admitting: Women's Health

## 2016-05-17 ENCOUNTER — Ambulatory Visit (INDEPENDENT_AMBULATORY_CARE_PROVIDER_SITE_OTHER): Payer: Medicaid Other | Admitting: Women's Health

## 2016-05-17 ENCOUNTER — Ambulatory Visit: Payer: Self-pay | Admitting: Women's Health

## 2016-05-17 VITALS — BP 124/66 | HR 72 | Wt 234.0 lb

## 2016-05-17 DIAGNOSIS — N898 Other specified noninflammatory disorders of vagina: Secondary | ICD-10-CM | POA: Diagnosis not present

## 2016-05-17 DIAGNOSIS — L732 Hidradenitis suppurativa: Secondary | ICD-10-CM | POA: Diagnosis not present

## 2016-05-17 DIAGNOSIS — N926 Irregular menstruation, unspecified: Secondary | ICD-10-CM | POA: Diagnosis not present

## 2016-05-17 LAB — POCT WET PREP (WET MOUNT): Clue Cells Wet Prep Whiff POC: NEGATIVE

## 2016-05-17 NOTE — Patient Instructions (Addendum)
Ask about Humira (for Hidradenitis)  Medroxyprogesterone injection [Contraceptive]- Depo What is this medicine? MEDROXYPROGESTERONE (me DROX ee proe JES te rone) contraceptive injections prevent pregnancy. They provide effective birth control for 3 months. Depo-subQ Provera 104 is also used for treating pain related to endometriosis. This medicine may be used for other purposes; ask your health care provider or pharmacist if you have questions. What should I tell my health care provider before I take this medicine? They need to know if you have any of these conditions: -frequently drink alcohol -asthma -blood vessel disease or a history of a blood clot in the lungs or legs -bone disease such as osteoporosis -breast cancer -diabetes -eating disorder (anorexia nervosa or bulimia) -high blood pressure -HIV infection or AIDS -kidney disease -liver disease -mental depression -migraine -seizures (convulsions) -stroke -tobacco smoker -vaginal bleeding -an unusual or allergic reaction to medroxyprogesterone, other hormones, medicines, foods, dyes, or preservatives -pregnant or trying to get pregnant -breast-feeding How should I use this medicine? Depo-Provera Contraceptive injection is given into a muscle. Depo-subQ Provera 104 injection is given under the skin. These injections are given by a health care professional. You must not be pregnant before getting an injection. The injection is usually given during the first 5 days after the start of a menstrual period or 6 weeks after delivery of a baby. Talk to your pediatrician regarding the use of this medicine in children. Special care may be needed. These injections have been used in female children who have started having menstrual periods. Overdosage: If you think you have taken too much of this medicine contact a poison control center or emergency room at once. NOTE: This medicine is only for you. Do not share this medicine with  others. What if I miss a dose? Try not to miss a dose. You must get an injection once every 3 months to maintain birth control. If you cannot keep an appointment, call and reschedule it. If you wait longer than 13 weeks between Depo-Provera contraceptive injections or longer than 14 weeks between Depo-subQ Provera 104 injections, you could get pregnant. Use another method for birth control if you miss your appointment. You may also need a pregnancy test before receiving another injection. What may interact with this medicine? Do not take this medicine with any of the following medications: -bosentan This medicine may also interact with the following medications: -aminoglutethimide -antibiotics or medicines for infections, especially rifampin, rifabutin, rifapentine, and griseofulvin -aprepitant -barbiturate medicines such as phenobarbital or primidone -bexarotene -carbamazepine -medicines for seizures like ethotoin, felbamate, oxcarbazepine, phenytoin, topiramate -modafinil -St. John's wort This list may not describe all possible interactions. Give your health care provider a list of all the medicines, herbs, non-prescription drugs, or dietary supplements you use. Also tell them if you smoke, drink alcohol, or use illegal drugs. Some items may interact with your medicine. What should I watch for while using this medicine? This drug does not protect you against HIV infection (AIDS) or other sexually transmitted diseases. Use of this product may cause you to lose calcium from your bones. Loss of calcium may cause weak bones (osteoporosis). Only use this product for more than 2 years if other forms of birth control are not right for you. The longer you use this product for birth control the more likely you will be at risk for weak bones. Ask your health care professional how you can keep strong bones. You may have a change in bleeding pattern or irregular periods. Many females stop having periods  while taking this drug. If you have received your injections on time, your chance of being pregnant is very low. If you think you may be pregnant, see your health care professional as soon as possible. Tell your health care professional if you want to get pregnant within the next year. The effect of this medicine may last a long time after you get your last injection. What side effects may I notice from receiving this medicine? Side effects that you should report to your doctor or health care professional as soon as possible: -allergic reactions like skin rash, itching or hives, swelling of the face, lips, or tongue -breast tenderness or discharge -breathing problems -changes in vision -depression -feeling faint or lightheaded, falls -fever -pain in the abdomen, chest, groin, or leg -problems with balance, talking, walking -unusually weak or tired -yellowing of the eyes or skin Side effects that usually do not require medical attention (report to your doctor or health care professional if they continue or are bothersome): -acne -fluid retention and swelling -headache -irregular periods, spotting, or absent periods -temporary pain, itching, or skin reaction at site where injected -weight gain This list may not describe all possible side effects. Call your doctor for medical advice about side effects. You may report side effects to FDA at 1-800-FDA-1088. Where should I keep my medicine? This does not apply. The injection will be given to you by a health care professional. NOTE: This sheet is a summary. It may not cover all possible information. If you have questions about this medicine, talk to your doctor, pharmacist, or health care provider.    2016, Elsevier/Gold Standard. (2008-12-12 18:37:56)

## 2016-05-17 NOTE — Progress Notes (Signed)
Patient ID: Loretta Moore Plazola, female   DOB: [birth year], 20 y.o.   MRN: [id number]   [facility]  Patient name: Loretta Moore Blakesley MRN [id number]  Date of birth: [birth year]  CC & HPI:  Loretta Moore Reidel is a 20 y.o. [id number] African American female presenting today to discuss getting off of depo d/t periods lasting 7-9d w/ normal flow, feels like they come sooner than they are supposed to but hasn't been keeping track. She started depo in Oct and has had 3 shots so far. Denies abnormal d/c, itching/odor.  Had thought about patch but is >200lbs so is not effective, can't remember to take pills, not interested in nuva ring, also reports migraines w/ aura- so estrogen is not an option. Discussed progestin only options- she does not want nexplanon d/t her severe hidradenitis and she usually develops areas where she has trauma to skin. Does not want 'anything in my uterus' and can't remember to take pills, so depo is only option for her. She wants to stick w/ this.  States she has had 15 surgeries for hidradenitis, and is scheduled to see plastic surgeon on Friday.  No LMP recorded. The current method of family planning is Depo-Provera injections. Last pap never, turned 20yo in Aug  Pertinent History Reviewed:  Medical & Surgical Hx:   Past medical, surgical, family, and social history reviewed in electronic medical record Medications: Reviewed & Updated - see associated section Allergies: Reviewed in electronic medical record  Objective Findings:  Vitals: BP 124/66 mmHg  Pulse 72  Wt 234 lb (106.142 kg) Body mass index is 45.7 kg/(m^2).  Physical Examination: General appearance - alert, well appearing, and in no distress Pelvic - normal external genitalia, cx clear, normal pink d/c w/o odor- states she is getting ready to start period  Results for orders placed or performed in visit on 05/17/16 (from the past 24 hour(s))  POCT Wet Prep Mellody Drown(Wet [place])   Collection Time: 05/17/16  9:37 AM   Result Value Ref Range   Source Wet Prep POC vaginal    WBC, Wet Prep HPF POC none    Bacteria Wet Prep HPF POC None None, Few, Too numerous to count   BACTERIA WET PREP MORPHOLOGY POC     Clue Cells Wet Prep HPF POC None None, Too numerous to count   Clue Cells Wet Prep Whiff POC Negative Whiff    Yeast Wet Prep HPF POC None    KOH Wet Prep POC     Trichomonas Wet Prep HPF POC none      Assessment & Plan:  A:   Irregular periods w/ depo  Hidradenitis  P:  Continue depo, as none of the other contraception options are appropriate/or she declines  GC/CT from urine  Discuss option of Humira w/ surgeon/dermatologist for Hidradenitis  Return in about 1 month (around 06/16/2016) for Pap & physical and depo.  Marge DuncansBooker, Jairus Tonne Randall CNM, [facility] 05/17/2016 9:38 AM

## 2016-05-19 LAB — GC/CHLAMYDIA PROBE AMP
CHLAMYDIA, DNA PROBE: NEGATIVE
NEISSERIA GONORRHOEAE BY PCR: NEGATIVE

## 2016-06-20 ENCOUNTER — Other Ambulatory Visit: Payer: Self-pay | Admitting: Women's Health

## 2016-06-27 ENCOUNTER — Other Ambulatory Visit: Payer: Self-pay | Admitting: Women's Health

## 2016-06-27 ENCOUNTER — Ambulatory Visit (INDEPENDENT_AMBULATORY_CARE_PROVIDER_SITE_OTHER): Payer: Medicaid Other | Admitting: *Deleted

## 2016-06-27 ENCOUNTER — Encounter: Payer: Self-pay | Admitting: *Deleted

## 2016-06-27 DIAGNOSIS — Z3202 Encounter for pregnancy test, result negative: Secondary | ICD-10-CM

## 2016-06-27 DIAGNOSIS — Z3042 Encounter for surveillance of injectable contraceptive: Secondary | ICD-10-CM | POA: Diagnosis not present

## 2016-06-27 LAB — POCT URINE PREGNANCY: PREG TEST UR: NEGATIVE

## 2016-06-27 MED ORDER — MEDROXYPROGESTERONE ACETATE 150 MG/ML IM SUSP
150.0000 mg | Freq: Once | INTRAMUSCULAR | Status: AC
  Administered 2016-06-27: 150 mg via INTRAMUSCULAR

## 2016-06-27 NOTE — Progress Notes (Signed)
Pt here for Depo. Pt tolerated shot well. Return in 12 weeks for next shot. JSY 

## 2016-09-19 ENCOUNTER — Ambulatory Visit: Payer: Medicaid Other

## 2016-10-04 ENCOUNTER — Ambulatory Visit: Payer: Medicaid Other

## 2016-10-04 ENCOUNTER — Other Ambulatory Visit: Payer: Medicaid Other

## 2016-10-11 ENCOUNTER — Ambulatory Visit: Payer: Medicaid Other

## 2016-10-11 ENCOUNTER — Other Ambulatory Visit: Payer: Medicaid Other

## 2016-11-09 ENCOUNTER — Other Ambulatory Visit: Payer: Medicaid Other | Admitting: Advanced Practice Midwife

## 2016-11-10 ENCOUNTER — Other Ambulatory Visit: Payer: Medicaid Other | Admitting: Advanced Practice Midwife

## 2017-01-24 DIAGNOSIS — J45998 Other asthma: Secondary | ICD-10-CM | POA: Diagnosis not present

## 2017-01-24 DIAGNOSIS — J209 Acute bronchitis, unspecified: Secondary | ICD-10-CM | POA: Diagnosis not present

## 2017-02-12 ENCOUNTER — Encounter (HOSPITAL_COMMUNITY): Payer: Self-pay | Admitting: Emergency Medicine

## 2017-02-12 ENCOUNTER — Ambulatory Visit (HOSPITAL_COMMUNITY)
Admission: EM | Admit: 2017-02-12 | Discharge: 2017-02-12 | Disposition: A | Discharge status: Home / Self Care | Payer: Medicaid Other | Attending: Nurse Practitioner | Admitting: Nurse Practitioner

## 2017-02-12 DIAGNOSIS — J209 Acute bronchitis, unspecified: Secondary | ICD-10-CM

## 2017-02-12 MED ORDER — HYDROCOD POLST-CPM POLST ER 10-8 MG/5ML PO SUER: 5.0000 mL | Freq: Two times a day (BID) | ORAL | 0 refills | Status: AC | PRN

## 2017-02-12 NOTE — ED Provider Notes (Signed)
CSN: [id number]     Arrival date & time 02/12/17  1727 History   First MD Initiated Contact with Patient 02/12/17 1747     Chief Complaint  Patient presents with  . Chest Pain   (Consider location/radiation/quality/duration/timing/severity/associated sxs/prior Treatment) Patient is a well-appearing 21 y.o. Female, here for a cough for 2 weeks' duration and now her left rib cage is hurting from coughing. Cough is intermittent and non-productive. She also has running nose, nasal congestion, sneezing and headache. She denies CP and SOB. She denies wheezing. She have tried several OTC medications with no relief. She denies any alleviating or aggravating factors.        Past Medical History:  Diagnosis Date  . Anemia   . Asthma   . Hard of hearing   . Hidradenitis suppurativa    had excisions in axilla and groin  . Pregnant 12/24/2014   Past Surgical History:  Procedure Laterality Date  . AXILLARY HIDRADENITIS EXCISION    . CESAREAN SECTION N/A 08/29/2015   Procedure: CESAREAN SECTION;  Surgeon: Levie HeritageJacob J Stinson, DO;  Location: [facility];  Service: Obstetrics;  Laterality: N/A;  . INGUINAL HIDRADENITIS EXCISION    . TONSILLECTOMY AND ADENOIDECTOMY     Family History  Problem Relation Age of Onset  . Anemia Mother   . Hypertension Mother   . Other Mother     hidradenitis  . Epilepsy Father   . Other Sister     hidradenitis  . Epilepsy Brother   . Other Paternal Grandmother     aneursym  . Cancer Paternal Grandfather    Social History  Substance Use Topics  . Smoking status: Current Every Day Smoker    Packs/day: 0.50    Years: 3.00    Types: Cigarettes    Last attempt to quit: 06/02/2015  . Smokeless tobacco: Never Used  . Alcohol use 0.0 oz/week     Comment: on weekends   OB History    Gravida Para Term Preterm AB Living   1 1 1     1    SAB TAB Ectopic Multiple Live Births         0 1     Review of Systems  Constitutional: Negative for chills, fatigue and fever.   HENT: Positive for congestion, rhinorrhea and sneezing. Negative for ear pain and sore throat.   Eyes:       +watery eyes  Respiratory: Positive for cough. Negative for shortness of breath and wheezing.   Cardiovascular: Negative for chest pain and palpitations.  Gastrointestinal: Negative for abdominal pain, diarrhea, nausea and vomiting.  Neurological: Positive for headaches. Negative for dizziness.    Allergies  Bee venom; Amoxicillin; Augmentin [amoxicillin-pot clavulanate]; Ceclor [cefaclor]; Penicillins; Sulfa antibiotics; Tomato; and Other  Home Medications   Prior to Admission medications   Medication Sig Start Date End Date Taking? Authorizing Provider  chlorpheniramine-HYDROcodone (TUSSIONEX PENNKINETIC ER) 10-8 MG/5ML SUER Take 5 mLs by mouth every 12 (twelve) hours as needed for cough. 02/12/17 02/19/17  Lucia EstelleFeng Mamta Rimmer, NP   Meds Ordered and Administered this Visit  Medications - No data to display  BP 127/56 (BP Location: Right Arm)   Pulse 89   Temp 98.7 F (37.1 C) (Oral)   Resp 18   SpO2 98%  No data found.   Physical Exam  Constitutional: She is oriented to person, place, and time. She appears well-developed and well-nourished.  HENT:  Head: Normocephalic and atraumatic.  Right Ear: External ear normal.  Left Ear:  External ear normal.  Nose: Nose normal.  Mouth/Throat: Oropharynx is clear and moist. No oropharyngeal exudate.  TM pearly gray with no erythema  Eyes: Conjunctivae are normal. Pupils are equal, round, and reactive to light.  Neck: Normal range of motion. Neck supple.  Cardiovascular: Normal rate, regular rhythm and normal heart sounds.   Pulmonary/Chest: Effort normal and breath sounds normal. She has no wheezes.  Abdominal: Soft. Bowel sounds are normal. She exhibits no distension. There is no tenderness.  Musculoskeletal:  Left rib cage sore to palpate  Lymphadenopathy:    She has no cervical adenopathy.  Neurological: She is alert and  oriented to person, place, and time.  Skin: Skin is warm and dry.  Nursing note and vitals reviewed.   Urgent Care Course     Procedures (including critical care time)  Labs Review Labs Reviewed - No data to display  Imaging Review No results found.    MDM   1. Acute bronchitis, unspecified organism    RX for Tussionex given. Reviewed directions for usage and side effects. Patient states understanding and will call with questions or problems. Patient instructed to call or follow up with his/her primary care doctor if failure to improve or change in symptoms. Discharge instruction given.     Lucia Estelle, NP 02/12/17 1759

## 2017-02-12 NOTE — ED Triage Notes (Signed)
[facility] a [facility] left side rib pain that she attributes to coughing last week.

## 2017-05-12 DIAGNOSIS — H04122 Dry eye syndrome of left lacrimal gland: Secondary | ICD-10-CM | POA: Diagnosis not present

## 2017-05-14 ENCOUNTER — Encounter (HOSPITAL_COMMUNITY): Payer: Self-pay | Admitting: Emergency Medicine

## 2017-05-14 ENCOUNTER — Emergency Department (HOSPITAL_COMMUNITY)
Admission: EM | Admit: 2017-05-14 | Discharge: 2017-05-14 | Disposition: A | Discharge status: Home / Self Care | Payer: Medicaid Other | Attending: Emergency Medicine | Admitting: Emergency Medicine

## 2017-05-14 DIAGNOSIS — J45909 Unspecified asthma, uncomplicated: Secondary | ICD-10-CM | POA: Insufficient documentation

## 2017-05-14 DIAGNOSIS — L231 Allergic contact dermatitis due to adhesives: Secondary | ICD-10-CM | POA: Diagnosis not present

## 2017-05-14 DIAGNOSIS — F17210 Nicotine dependence, cigarettes, uncomplicated: Secondary | ICD-10-CM | POA: Insufficient documentation

## 2017-05-14 DIAGNOSIS — H5712 Ocular pain, left eye: Secondary | ICD-10-CM | POA: Diagnosis present

## 2017-05-14 DIAGNOSIS — Z882 Allergy status to sulfonamides status: Secondary | ICD-10-CM | POA: Insufficient documentation

## 2017-05-14 DIAGNOSIS — Z880 Allergy status to penicillin: Secondary | ICD-10-CM | POA: Diagnosis not present

## 2017-05-14 DIAGNOSIS — Z91030 Bee allergy status: Secondary | ICD-10-CM | POA: Diagnosis not present

## 2017-05-14 MED ORDER — TETRACAINE HCL 0.5 % OP SOLN
1.0000 [drp] | Freq: Once | OPHTHALMIC | Status: AC
  Administered 2017-05-14: 1 [drp] via OPHTHALMIC
  Filled 2017-05-14: qty 2

## 2017-05-14 MED ORDER — CLINDAMYCIN HCL 300 MG PO CAPS: 300.0000 mg | ORAL_CAPSULE | Freq: Three times a day (TID) | ORAL | 0 refills | Status: DC

## 2017-05-14 MED ORDER — FLUORESCEIN SODIUM 0.6 MG OP STRP
1.0000 | ORAL_STRIP | Freq: Once | OPHTHALMIC | Status: AC
  Administered 2017-05-14: 1 via OPHTHALMIC
  Filled 2017-05-14: qty 1

## 2017-05-14 NOTE — ED Triage Notes (Signed)
Pt sts left eye irritation after pus from abscess got into eye; [facility] and told [facility] had allergies but no better

## 2017-05-14 NOTE — ED Notes (Signed)
Woods lamp in room  

## 2017-05-14 NOTE — ED Provider Notes (Signed)
[facility]-EMERGENCY DEPT Provider Note   CSN: [id number] Arrival date & time: 05/14/17  1021  By signing my name below, I, Loretta NeighborsMaurice Deon Copeland Jr., attest that this documentation has been prepared under the direction and in the presence of Loretta HartKelly Gekas, PA-C. Electronically signed: Bing NeighborsMaurice Deon Copeland Jr., ED Scribe. 05/14/17. 10:32 AM.   History   Chief Complaint Chief Complaint  Patient presents with  . Eye Pain    HPI Loretta Moore is a 21 y.o. female with hx of hidradenitis suppurativa who presents to the Emergency Department complaining of constant, worsening, mild to moderate L eye pain with onset x5 days. Pt states that x4 days ago, she was bothering an abscess on her leg when the pus shot into her L eye. She states that since this moment she has had worsening L eye pain and swelling. She describes the eye pain as burning and reports FB sensation. [facility] and told that she it was due to seasonal allergies. She has been taking Visine OTC which has worsened her symptoms. She reports hx of seasonal allergies but denies prior eye involvement. Pt reports L eye redness, eye drainage (clear), OS visual disturbance, photophobia, facial pain. She denies any modifying factors. Pt denies fever, R eye problem. Pt denies daily glasses/contact use. Of note, pt states that when she awake in the morning she has yellow crusting around the eye.    The history is provided by the patient. No language interpreter was used.    Past Medical History:  Diagnosis Date  . Anemia   . Asthma   . Hard of hearing   . Hidradenitis suppurativa    had excisions in axilla and groin  . Pregnant 12/24/2014    Patient Active Problem List   Diagnosis Date Noted  . S/P cesarean section 09/01/2015  . Asthma 04/29/2015  . Smoker 04/29/2015  . Chlamydia 02/05/2015  . Hidradenitis suppurativa   . Hard of hearing     Past Surgical History:  Procedure Laterality Date  .  AXILLARY HIDRADENITIS EXCISION    . CESAREAN SECTION N/A 08/29/2015   Procedure: CESAREAN SECTION;  Surgeon: Levie HeritageJacob J Stinson, DO;  Location: [facility];  Service: Obstetrics;  Laterality: N/A;  . INGUINAL HIDRADENITIS EXCISION    . TONSILLECTOMY AND ADENOIDECTOMY      OB History    Gravida Para Term Preterm AB Living   1 1 1     1    SAB TAB Ectopic Multiple Live Births         0 1       Home Medications    Prior to Admission medications   Not on File    Family History Family History  Problem Relation Age of Onset  . Anemia Mother   . Hypertension Mother   . Other Mother        hidradenitis  . Epilepsy Father   . Other Sister        hidradenitis  . Epilepsy Brother   . Other Paternal Grandmother        aneursym  . Cancer Paternal Grandfather     Social History Social History  Substance Use Topics  . Smoking status: Current Every Day Smoker    Packs/day: 0.50    Years: 3.00    Types: Cigarettes    Last attempt to quit: 06/02/2015  . Smokeless tobacco: Never Used  . Alcohol use 0.0 oz/week     Comment: on weekends  Allergies   Bee venom; Amoxicillin; Augmentin [amoxicillin-pot clavulanate]; Ceclor [cefaclor]; Penicillins; Sulfa antibiotics; Tomato; and Other   Review of Systems Review of Systems  Constitutional: Negative for fever.  HENT: Negative for ear pain, rhinorrhea, sneezing and sore throat.   Eyes: Positive for photophobia (OS), pain (OS), discharge (OS, clear), redness (OS) and visual disturbance (OS). Negative for itching.  Respiratory: Positive for cough (for weeks).      Physical Exam Updated Vital Signs BP 120/71   Pulse 84   Temp 99.2 F (37.3 C) (Oral)   Resp 17   SpO2 97%   Physical Exam  Constitutional: She appears well-developed and well-nourished. No distress.  HENT:  Head: Normocephalic and atraumatic.  Right Ear: Hearing, tympanic membrane, external ear and ear canal normal.  Left Ear: Hearing, tympanic membrane, external  ear and ear canal normal.  Eyes: EOM are normal. Pupils are equal, round, and reactive to light. Right eye exhibits no discharge. No foreign body present in the right eye. Left eye exhibits discharge. No foreign body present in the left eye. Right conjunctiva is not injected. Left conjunctiva is injected.  Slit lamp exam:      The left eye shows no corneal abrasion and no fluorescein uptake.  L eye with swelling and redness along the medial aspect of the periorbital area. L eye is injected. There is watery drainage. Mild amount of swelling around the eye. Visual acuity: 20/25 OD, 20/50 OS.  Neck: Normal range of motion. Neck supple.  Cardiovascular: Normal rate and regular rhythm.   No murmur heard. Pulmonary/Chest: Effort normal and breath sounds normal. No respiratory distress.  Abdominal: Soft. There is no tenderness.  Musculoskeletal: She exhibits no edema.  Lymphadenopathy:       Head (left side): Preauricular and posterior auricular adenopathy present.  Pre and posterior lymphadenopathy on the L.  Neurological: She is alert.  Skin: Skin is warm and dry.  Psychiatric: She has a normal mood and affect.  Nursing note and vitals reviewed.    ED Treatments / Results   DIAGNOSTIC STUDIES: Oxygen Saturation is 97% on RA, normal by my interpretation.   COORDINATION OF CARE: 10:33 AM-Discussed next steps with pt. Pt verbalized understanding and is agreeable with the plan.    Labs (all labs ordered are listed, but only abnormal results are displayed) Labs Reviewed - No data to display  EKG  EKG Interpretation None       Radiology No results found.  Procedures Procedures (including critical care time)  Medications Ordered in ED Medications  tetracaine (PONTOCAINE) 0.5 % ophthalmic solution 1 drop (1 drop Left Eye Given 05/14/17 1116)  fluorescein ophthalmic strip 1 strip (1 strip Left Eye Given 05/14/17 1116)     Initial Impression / Assessment and Plan / ED Course    I have reviewed the triage vital signs and the nursing notes.  Pertinent labs & imaging results that were available during my care of the patient were reviewed by me and considered in my medical decision making (see chart for details).  21 year old with L eye pain, redness, swelling, discharge after purulent fluid from abscess went in to her eye as well as medial L periorbital pain and swelling. Symptoms are most consistent with dacryocystitis with possible bacterial conjunctivitis. Visual acuity is slightly decreased in L eye. Exam was technically difficult since patient was in pain and she was unable to let me fully examine her although she was cooperative. Will d/c with Clindamycin and erythromycin ointment  since she has PCN allergy and advised close follow up with Opthalmology. Return precautions given.   Final Clinical Impressions(s) / ED Diagnoses   Final diagnoses:  Left eye pain    New Prescriptions Discharge Medication List as of 05/14/2017 11:48 AM    START taking these medications   Details  clindamycin (CLEOCIN) 300 MG capsule Take 1 capsule (300 mg total) by mouth 3 (three) times daily., Starting Sun 05/14/2017, Print       I personally performed the services described in this documentation, which was scribed in my presence. The recorded information has been reviewed and is accurate.     Bethel Born, PA-C 05/14/17 1627    Vanetta Mulders, MD 05/15/17 [phone]

## 2017-05-14 NOTE — ED Notes (Signed)
Left eye pain and drainage 3 days ago. [facility] They said it was allergies.

## 2017-05-14 NOTE — Discharge Instructions (Signed)
Take Clindamycin three times a day Try cool/warm compresses Follow up with eye doctor

## 2017-05-22 DIAGNOSIS — H04123 Dry eye syndrome of bilateral lacrimal glands: Secondary | ICD-10-CM | POA: Diagnosis not present

## 2017-05-22 DIAGNOSIS — H10503 Unspecified blepharoconjunctivitis, bilateral: Secondary | ICD-10-CM | POA: Diagnosis not present

## 2017-11-06 ENCOUNTER — Emergency Department (HOSPITAL_COMMUNITY): Admission: EM | Admit: 2017-11-06 | Discharge: 2017-11-06 | Discharge status: Absent without leave | Payer: Self-pay

## 2017-11-06 ENCOUNTER — Encounter (HOSPITAL_COMMUNITY): Payer: Self-pay | Admitting: *Deleted

## 2017-11-06 ENCOUNTER — Ambulatory Visit (HOSPITAL_COMMUNITY)
Admission: EM | Admit: 2017-11-06 | Discharge: 2017-11-06 | Disposition: A | Discharge status: Home / Self Care | Payer: Self-pay | Attending: Family Medicine | Admitting: Family Medicine

## 2017-11-06 DIAGNOSIS — L0291 Cutaneous abscess, unspecified: Secondary | ICD-10-CM

## 2017-11-06 MED ORDER — HYDROCODONE-ACETAMINOPHEN 5-325 MG PO TABS: 1.0000 | ORAL_TABLET | Freq: Four times a day (QID) | ORAL | 0 refills | Status: DC | PRN

## 2017-11-06 MED ORDER — CLINDAMYCIN HCL 300 MG PO CAPS: 300.0000 mg | ORAL_CAPSULE | Freq: Three times a day (TID) | ORAL | 0 refills | Status: DC

## 2017-11-06 MED ORDER — LIDOCAINE HCL (PF) 2 % IJ SOLN
INTRAMUSCULAR | Status: AC
  Filled 2017-11-06: qty 2

## 2017-11-06 NOTE — ED Triage Notes (Signed)
Patient reports abscess to groin x 1 week. No drainage.

## 2017-11-06 NOTE — ED Notes (Signed)
Called for triage x2 no answer

## 2017-11-06 NOTE — ED Provider Notes (Signed)
  [facility]   [id number] 11/06/17 Arrival Time: 1146  ASSESSMENT & PLAN:  1. Abscess   -R labia  Meds ordered this encounter  Medications  . clindamycin (CLEOCIN) 300 MG capsule    Sig: Take 1 capsule (300 mg total) by mouth 3 (three) times daily.    Dispense:  30 capsule    Refill:  0  . HYDROcodone-acetaminophen (NORCO/VICODIN) 5-325 MG tablet    Sig: Take 1 tablet by mouth every 6 (six) hours as needed for moderate pain or severe pain.    Dispense:  8 tablet    Refill:  0    Procedure: Verbal consent obtained. Area over induration cleaned with betadine. Lidocaine 2% without epinephrine used to obtain local anesthesia. The most fluctuant portion of the abscess was incised with a #11 blade scalpel. Abscess cavity evacuated. No packing secondary to small size of abscess. Minimal bleeding. No complications. Chaperone present.  Wound care instructions discussed and given in written format. To return in 48 hours for wound check.  Finish all antibiotics. OTC analgesics as needed.  Reviewed expectations re: course of current medical issues. Questions answered. Outlined signs and symptoms indicating need for more acute intervention. Patient verbalized understanding. After Visit Summary given.   SUBJECTIVE:  Loretta Moore is a 21 y.o. female who presents with a possible abscess of her R labia. Gradual onset over the past week. H/O hidradenitis with surgical intervention required in the past.  ROS: As per HPI.  OBJECTIVE:  Vitals:   11/06/17 1221  BP: 123/78  Pulse: 78  Resp: 17  Temp: 98 F (36.7 C)  TempSrc: Oral  SpO2: 100%     General appearance: alert; no distress Skin: approx 1 cm induration on R labia; tender; no active drainage; multiple scarred areas surrounding Psychological: alert and cooperative; normal mood and affect  Allergies  Allergen Reactions  . Bee Venom Anaphylaxis  . Amoxicillin Hives  . Augmentin [Amoxicillin-Pot Clavulanate]  Hives  . Ceclor [Cefaclor] Hives  . Penicillins Hives    Has patient had a PCN reaction causing immediate rash, facial/tongue/throat swelling, SOB or lightheadedness with hypotension: Yes Has patient had a PCN reaction causing severe rash involving mucus membranes or skin necrosis: No Has patient had a PCN reaction that required hospitalization No Has patient had a PCN reaction occurring within the last 10 years: No If all of the above answers are "NO", then may proceed with Cephalosporin use.   . Sulfa Antibiotics Hives  . Tomato Hives  . Other Rash    Paper tape causes rash    Past Medical History:  Diagnosis Date  . Anemia   . Asthma   . Hard of hearing   . Hidradenitis suppurativa    had excisions in axilla and groin  . Pregnant 12/24/2014    Past Surgical History:  Procedure Laterality Date  . AXILLARY HIDRADENITIS EXCISION    . CESAREAN SECTION N/A 08/29/2015   Procedure: CESAREAN SECTION;  Surgeon: Levie HeritageJacob J Stinson, DO;  Location: [facility];  Service: Obstetrics;  Laterality: N/A;  . INGUINAL HIDRADENITIS EXCISION    . TONSILLECTOMY AND ADENOIDECTOMY             Mardella LaymanHagler, Tashanda Fuhrer, MD 11/09/17 [phone]

## 2017-11-06 NOTE — ED Notes (Signed)
Called for triage x 1.  No answer. 

## 2017-12-11 ENCOUNTER — Encounter (HOSPITAL_COMMUNITY): Payer: Self-pay | Admitting: Emergency Medicine

## 2017-12-11 ENCOUNTER — Ambulatory Visit (HOSPITAL_COMMUNITY)
Admission: EM | Admit: 2017-12-11 | Discharge: 2017-12-11 | Disposition: A | Discharge status: Home / Self Care | Payer: Self-pay | Attending: Emergency Medicine | Admitting: Emergency Medicine

## 2017-12-11 DIAGNOSIS — L0291 Cutaneous abscess, unspecified: Secondary | ICD-10-CM

## 2017-12-11 MED ORDER — LIDOCAINE HCL (PF) 2 % IJ SOLN
INTRAMUSCULAR | Status: AC
  Filled 2017-12-11: qty 2

## 2017-12-11 MED ORDER — TRAMADOL HCL 50 MG PO TABS: 50.0000 mg | ORAL_TABLET | Freq: Four times a day (QID) | ORAL | 0 refills | Status: DC | PRN

## 2017-12-11 MED ORDER — LIDOCAINE HCL (PF) 1 % IJ SOLN
5.0000 mL | Freq: Once | INTRAMUSCULAR | Status: AC
  Administered 2017-12-11: 5 mL

## 2017-12-11 NOTE — Discharge Instructions (Addendum)
Packing to be removed in 2 days  Wash dry area well  Take full dose of abx of the clindamycin that you have at home  Keep covered

## 2017-12-11 NOTE — ED Provider Notes (Signed)
[facility]    CSN: [id number] Arrival date & time: 12/11/17  1831     History   Chief Complaint Chief Complaint  Patient presents with  . Abscess    HPI Loretta Moore is a 22 y.o. female.   Pt states that she gets abscess all the time to her body and has had one to LT lower back for 1 week. She has been trying to treat it with wash compress, but not able to drain the area and is very painful.       Past Medical History:  Diagnosis Date  . Anemia   . Asthma   . Hard of hearing   . Hidradenitis suppurativa    had excisions in axilla and groin  . Pregnant 12/24/2014    Patient Active Problem List   Diagnosis Date Noted  . S/P cesarean section 09/01/2015  . Asthma 04/29/2015  . Smoker 04/29/2015  . Chlamydia 02/05/2015  . Hidradenitis suppurativa   . Hard of hearing     Past Surgical History:  Procedure Laterality Date  . AXILLARY HIDRADENITIS EXCISION    . CESAREAN SECTION N/A 08/29/2015   Procedure: CESAREAN SECTION;  Surgeon: Levie Heritage, DO;  Location: [facility];  Service: Obstetrics;  Laterality: N/A;  . INGUINAL HIDRADENITIS EXCISION    . TONSILLECTOMY AND ADENOIDECTOMY      OB History    Gravida Para Term Preterm AB Living   1 1 1     1    SAB TAB Ectopic Multiple Live Births         0 1       Home Medications    Prior to Admission medications   Medication Sig Start Date End Date Taking? Authorizing Provider  albuterol (PROVENTIL HFA;VENTOLIN HFA) 108 (90 Base) MCG/ACT inhaler Inhale 1-2 puffs into the lungs every 6 (six) hours as needed for wheezing or shortness of breath.   Yes [provider]  clindamycin (CLEOCIN) 300 MG capsule Take 1 capsule (300 mg total) by mouth 3 (three) times daily. 11/06/17  Yes Mardella Layman, MD  HYDROcodone-acetaminophen (NORCO/VICODIN) 5-325 MG tablet Take 1 tablet by mouth every 6 (six) hours as needed for moderate pain or severe pain. 11/06/17   Mardella Layman, MD  naphazoline-pheniramine  (NAPHCON-A) 0.025-0.3 % ophthalmic solution Place 1 drop into the left eye 4 (four) times daily as needed for irritation.    [provider]    Family History Family History  Problem Relation Age of Onset  . Anemia Mother   . Hypertension Mother   . Other Mother        hidradenitis  . Epilepsy Father   . Other Sister        hidradenitis  . Epilepsy Brother   . Other Paternal Grandmother        aneursym  . Cancer Paternal Grandfather     Social History Social History   Tobacco Use  . Smoking status: Current Every Day Smoker    Packs/day: 0.50    Years: 3.00    Pack years: 1.50    Types: Cigarettes    Last attempt to quit: 06/02/2015    Years since quitting: 2.5  . Smokeless tobacco: Never Used  Substance Use Topics  . Alcohol use: Yes    Alcohol/week: 0.0 oz    Comment: on weekends  . Drug use: No     Allergies   Bee venom; Amoxicillin; Augmentin [amoxicillin-pot clavulanate]; Ceclor [cefaclor]; Penicillins; Sulfa antibiotics; Tomato; and Other  Review of Systems Review of Systems  Constitutional: Negative.   Respiratory: Negative.   Cardiovascular: Negative.   Skin: Positive for wound.       Area to lt flank area with moderate size abscess   Neurological: Negative.      Physical Exam Triage Vital Signs ED Triage Vitals  Enc Vitals Group     BP 12/11/17 1931 (!) 142/90     Pulse Rate 12/11/17 1930 86     Resp 12/11/17 1930 16     Temp 12/11/17 1930 98.7 F (37.1 C)     Temp Source 12/11/17 1930 Oral     SpO2 12/11/17 1930 100 %     Weight 12/11/17 1929 220 lb (99.8 kg)     Height 12/11/17 1929 5' (1.524 m)     Head Circumference --      Peak Flow --      Pain Score 12/11/17 1929 10     Pain Loc --      Pain Edu? --      Excl. in GC? --    No data found.  Updated Vital Signs BP (!) 142/90   Pulse 86   Temp 98.7 F (37.1 C) (Oral)   Resp 16   Ht 5' (1.524 m)   Wt 220 lb (99.8 kg)   LMP 11/10/2017   SpO2 100%   BMI 42.97 kg/m    Visual Acuity Right Eye Distance:   Left Eye Distance:   Bilateral Distance:    Right Eye Near:   Left Eye Near:    Bilateral Near:     Physical Exam  Constitutional: She appears well-developed.  Cardiovascular: Normal rate and regular rhythm.  Pulmonary/Chest: Effort normal and breath sounds normal.  Skin: Skin is warm.  Lt flank area quarter size fluid filled abscess no erythema surrounding.       UC Treatments / Results  Labs (all labs ordered are listed, but only abnormal results are displayed) Labs Reviewed - No data to display  EKG  EKG Interpretation None       Radiology No results found.  Procedures Incision and Drainage Date/Time: 12/11/2017 8:18 PM Performed by: Coralyn Mark, NP Authorized by: Coralyn Mark, NP   Consent:    Consent obtained:  Verbal   Consent given by:  Patient   Risks discussed:  Incomplete drainage, bleeding and pain   Alternatives discussed:  No treatment, delayed treatment and alternative treatment Location:    Type:  Abscess   Location:  Trunk   Trunk location:  Back Pre-procedure details:    Skin preparation:  Betadine Anesthesia (see MAR for exact dosages):    Anesthesia method:  Local infiltration   Local anesthetic:  Lidocaine 2% w/o epi Procedure type:    Complexity:  Complex Procedure details:    Needle aspiration: no     Incision types:  Stab incision   Incision depth:  Dermal   Scalpel blade:  15   Wound management:  Irrigated with saline   Drainage:  Serosanguinous   Drainage amount:  Copious   Wound treatment:  Wound left open   Packing materials:  None Post-procedure details:    Patient tolerance of procedure:  Tolerated well, no immediate complications   (including critical care time)  Medications Ordered in UC Medications  lidocaine (PF) (XYLOCAINE) 1 % injection 5 mL (not administered)     Initial Impression / Assessment and Plan / UC Course  I have reviewed the triage vital  signs and the nursing notes.  Pertinent labs & imaging results that were available during my care of the patient were reviewed by me and considered in my medical decision making (see chart for details).    Packing to be removed in 2 days  Wash dry area well  Take full dose of abx of the clindamycin that you have at home  Keep covered Take pain meds as needed    Final Clinical Impressions(s) / UC Diagnoses   Final diagnoses:  None    ED Discharge Orders    None       Controlled Substance Prescriptions [place] Controlled Substance Registry consulted? Yes, I have consulted the [place] Controlled Substances Registry for this patient, and feel the risk/benefit ratio today is favorable for proceeding with this prescription for a controlled substance.   Coralyn MarkMitchell, Linnea Todisco L, NP 12/11/17 2021

## 2017-12-11 NOTE — ED Triage Notes (Signed)
PT has Hidradenitis suppurativa and has an area on her left side that needs to be lanced per PT.

## 2017-12-11 NOTE — ED Notes (Signed)
PT made aware she cannot drive or operate machinery while taking tramadol for pain.

## 2018-02-14 ENCOUNTER — Other Ambulatory Visit: Payer: Self-pay

## 2018-02-14 ENCOUNTER — Ambulatory Visit (HOSPITAL_COMMUNITY)
Admission: EM | Admit: 2018-02-14 | Discharge: 2018-02-14 | Disposition: A | Discharge status: Home / Self Care | Payer: Self-pay | Attending: Family Medicine | Admitting: Family Medicine

## 2018-02-14 ENCOUNTER — Encounter (HOSPITAL_COMMUNITY): Payer: Self-pay | Admitting: Emergency Medicine

## 2018-02-14 DIAGNOSIS — L0291 Cutaneous abscess, unspecified: Secondary | ICD-10-CM

## 2018-02-14 MED ORDER — DOXYCYCLINE HYCLATE 100 MG PO TABS: 100.0000 mg | ORAL_TABLET | Freq: Two times a day (BID) | ORAL | 0 refills | Status: DC

## 2018-02-14 NOTE — ED Provider Notes (Signed)
[facility]   [id number] 02/14/18 Arrival Time: 1237   SUBJECTIVE:  Ellender HoseJaylin Moore is a 22 y.o. female who presents to the urgent care with complaint of abscess to her left, upper, posterior thigh/lower buttocks that has been there for 2 weeks.  This is a recurrent issue for her. Patient has hidradenitis suppurativa. Past Medical History:  Diagnosis Date  . Anemia   . Asthma   . Hard of hearing   . Hidradenitis suppurativa    had excisions in axilla and groin  . Pregnant 12/24/2014   Family History  Problem Relation Age of Onset  . Anemia Mother   . Hypertension Mother   . Other Mother        hidradenitis  . Epilepsy Father   . Other Sister        hidradenitis  . Epilepsy Brother   . Other Paternal Grandmother        aneursym  . Cancer Paternal Grandfather    Social History   Socioeconomic History  . Marital status: Single    Spouse name: Not on file  . Number of children: Not on file  . Years of education: Not on file  . Highest education level: Not on file  Social Needs  . Financial resource strain: Not on file  . Food insecurity - worry: Not on file  . Food insecurity - inability: Not on file  . Transportation needs - medical: Not on file  . Transportation needs - non-medical: Not on file  Occupational History  . Not on file  Tobacco Use  . Smoking status: Current Every Day Smoker    Packs/day: 0.50    Years: 3.00    Pack years: 1.50    Types: Cigarettes    Last attempt to quit: 06/02/2015    Years since quitting: 2.7  . Smokeless tobacco: Never Used  Substance and Sexual Activity  . Alcohol use: Yes    Alcohol/week: 0.0 oz    Comment: on weekends  . Drug use: No  . Sexual activity: Yes    Birth control/protection: Injection  Other Topics Concern  . Not on file  Social History Narrative  . Not on file   No outpatient medications have been marked as taking for the 02/14/18 encounter [facility](Hospital Encounter).   Allergies  Allergen  Reactions  . Bee Venom Anaphylaxis  . Amoxicillin Hives  . Augmentin [Amoxicillin-Pot Clavulanate] Hives  . Ceclor [Cefaclor] Hives  . Penicillins Hives    Has patient had a PCN reaction causing immediate rash, facial/tongue/throat swelling, SOB or lightheadedness with hypotension: Yes Has patient had a PCN reaction causing severe rash involving mucus membranes or skin necrosis: No Has patient had a PCN reaction that required hospitalization No Has patient had a PCN reaction occurring within the last 10 years: No If all of the above answers are "NO", then may proceed with Cephalosporin use.   . Sulfa Antibiotics Hives  . Tomato Hives  . Other Rash    Paper tape causes rash      ROS: As per HPI, remainder of ROS negative.   OBJECTIVE:   Vitals:   02/14/18 1339  BP: (!) 113/59  Pulse: 96  Temp: 98.4 F (36.9 C)  TempSrc: Oral  SpO2: 100%     General appearance: alert; no distress Eyes: PERRL; EOMI; conjunctiva normal HENT: normocephalic; atraumatic;  Neck: supple Back: no CVA tenderness Extremities: no cyanosis or edema; symmetrical with no gross deformities Skin: warm and dry Neurologic: normal gait;  grossly normal Psychological: alert and cooperative; normal mood and affect      Labs:  Results for orders placed or performed in visit on 06/27/16  POCT urine pregnancy  Result Value Ref Range   Preg Test, Ur Negative Negative    Labs Reviewed - No data to display  No results found.     ASSESSMENT & PLAN:  1. Abscess     Meds ordered this encounter  Medications  . doxycycline (VIBRA-TABS) 100 MG tablet    Sig: Take 1 tablet (100 mg total) by mouth 2 (two) times daily.    Dispense:  20 tablet    Refill:  0    Reviewed expectations re: course of current medical issues. Questions answered. Outlined signs and symptoms indicating need for more acute intervention. Patient verbalized understanding. After Visit Summary  given.    Procedures:  3 cm abscess right lower gluteal fold was I&D'd after 2% xylo with epi local.  Copious brown pus drained.    Elvina Sidle, MD 02/14/18 1422

## 2018-02-14 NOTE — Discharge Instructions (Signed)
Soak the abscess area in a tub or run shower water on it every 4 hours for next two days.

## 2018-02-14 NOTE — ED Triage Notes (Signed)
Pt reports an abscess to her left, upper, posterior thigh/lower buttocks that has been there for 2 weeks.  This is a recurrent issue for her.

## 2018-03-20 ENCOUNTER — Encounter (HOSPITAL_COMMUNITY): Payer: Self-pay | Admitting: Family Medicine

## 2018-03-20 ENCOUNTER — Telehealth (HOSPITAL_COMMUNITY): Payer: Self-pay | Admitting: Family Medicine

## 2018-03-20 ENCOUNTER — Ambulatory Visit (HOSPITAL_COMMUNITY)
Admission: EM | Admit: 2018-03-20 | Discharge: 2018-03-20 | Disposition: A | Discharge status: Home / Self Care | Payer: Self-pay | Attending: Family Medicine | Admitting: Family Medicine

## 2018-03-20 DIAGNOSIS — Z79899 Other long term (current) drug therapy: Secondary | ICD-10-CM | POA: Insufficient documentation

## 2018-03-20 DIAGNOSIS — Z91018 Allergy to other foods: Secondary | ICD-10-CM | POA: Insufficient documentation

## 2018-03-20 DIAGNOSIS — Z809 Family history of malignant neoplasm, unspecified: Secondary | ICD-10-CM | POA: Insufficient documentation

## 2018-03-20 DIAGNOSIS — L0231 Cutaneous abscess of buttock: Secondary | ICD-10-CM | POA: Insufficient documentation

## 2018-03-20 DIAGNOSIS — J45909 Unspecified asthma, uncomplicated: Secondary | ICD-10-CM | POA: Insufficient documentation

## 2018-03-20 DIAGNOSIS — Z882 Allergy status to sulfonamides status: Secondary | ICD-10-CM | POA: Insufficient documentation

## 2018-03-20 DIAGNOSIS — Z91030 Bee allergy status: Secondary | ICD-10-CM | POA: Insufficient documentation

## 2018-03-20 DIAGNOSIS — Z9889 Other specified postprocedural states: Secondary | ICD-10-CM | POA: Insufficient documentation

## 2018-03-20 DIAGNOSIS — Z8249 Family history of ischemic heart disease and other diseases of the circulatory system: Secondary | ICD-10-CM | POA: Insufficient documentation

## 2018-03-20 DIAGNOSIS — F17210 Nicotine dependence, cigarettes, uncomplicated: Secondary | ICD-10-CM | POA: Insufficient documentation

## 2018-03-20 DIAGNOSIS — Z881 Allergy status to other antibiotic agents status: Secondary | ICD-10-CM | POA: Insufficient documentation

## 2018-03-20 DIAGNOSIS — Z820 Family history of epilepsy and other diseases of the nervous system: Secondary | ICD-10-CM | POA: Insufficient documentation

## 2018-03-20 DIAGNOSIS — L0291 Cutaneous abscess, unspecified: Secondary | ICD-10-CM

## 2018-03-20 DIAGNOSIS — Z880 Allergy status to penicillin: Secondary | ICD-10-CM | POA: Insufficient documentation

## 2018-03-20 MED ORDER — CLINDAMYCIN HCL 300 MG PO CAPS: 300.0000 mg | ORAL_CAPSULE | Freq: Two times a day (BID) | ORAL | 0 refills | Status: AC

## 2018-03-20 NOTE — ED Provider Notes (Signed)
[facility]    CSN: [id number] Arrival date & time: 03/20/18  1044     History   Chief Complaint Chief Complaint  Patient presents with  . Abscess    HPI Loretta Moore is a 22 y.o. female.   The history is provided by the patient.  Abscess  Abscess location: Buttock. Size:  4 cm Abscess quality: fluctuance, induration, painful and redness   Red streaking: no   Duration:  1 week Progression:  Worsening Pain details:    Quality:  Dull   Severity:  Severe   Duration:  1 week   Timing:  Constant Ineffective treatments:  None tried Associated symptoms: no anorexia, no fatigue, no fever, no headaches, no nausea and no vomiting     Past Medical History:  Diagnosis Date  . Anemia   . Asthma   . Hard of hearing   . Hidradenitis suppurativa    had excisions in axilla and groin  . Pregnant 12/24/2014    Patient Active Problem List   Diagnosis Date Noted  . S/P cesarean section 09/01/2015  . Asthma 04/29/2015  . Smoker 04/29/2015  . Chlamydia 02/05/2015  . Hidradenitis suppurativa   . Hard of hearing     Past Surgical History:  Procedure Laterality Date  . AXILLARY HIDRADENITIS EXCISION    . CESAREAN SECTION N/A 08/29/2015   Procedure: CESAREAN SECTION;  Surgeon: Levie Heritage, DO;  Location: [facility];  Service: Obstetrics;  Laterality: N/A;  . INGUINAL HIDRADENITIS EXCISION    . TONSILLECTOMY AND ADENOIDECTOMY      OB History    Gravida  1   Para  1   Term  1   Preterm      AB      Living  1     SAB      TAB      Ectopic      Multiple  0   Live Births  1            Home Medications    Prior to Admission medications   Medication Sig Start Date End Date Taking? Authorizing Provider  albuterol (PROVENTIL HFA;VENTOLIN HFA) 108 (90 Base) MCG/ACT inhaler Inhale 1-2 puffs into the lungs every 6 (six) hours as needed for wheezing or shortness of breath.    [provider]  clindamycin (CLEOCIN) 300 MG capsule Take 1  capsule (300 mg total) by mouth 2 (two) times daily for 7 days. 03/20/18 03/27/18  Lucia Estelle, NP  doxycycline (VIBRA-TABS) 100 MG tablet Take 1 tablet (100 mg total) by mouth 2 (two) times daily. 02/14/18   Elvina Sidle, MD    Family History Family History  Problem Relation Age of Onset  . Anemia Mother   . Hypertension Mother   . Other Mother        hidradenitis  . Epilepsy Father   . Other Sister        hidradenitis  . Epilepsy Brother   . Other Paternal Grandmother        aneursym  . Cancer Paternal Grandfather     Social History Social History   Tobacco Use  . Smoking status: Current Every Day Smoker    Packs/day: 0.50    Years: 3.00    Pack years: 1.50    Types: Cigarettes    Last attempt to quit: 06/02/2015    Years since quitting: 2.8  . Smokeless tobacco: Never Used  Substance Use Topics  . Alcohol use: Yes  Alcohol/week: 0.0 oz    Comment: on weekends  . Drug use: No     Allergies   Bee venom; Amoxicillin; Augmentin [amoxicillin-pot clavulanate]; Ceclor [cefaclor]; Penicillins; Sulfa antibiotics; Tomato; and Other   Review of Systems Review of Systems  Constitutional: Negative for fatigue and fever.       See HPI  Gastrointestinal: Negative for anorexia, nausea and vomiting.  Neurological: Negative for headaches.     Physical Exam Triage Vital Signs ED Triage Vitals  Enc Vitals Group     BP 03/20/18 1115 115/78     Pulse Rate 03/20/18 1115 (!) 101     Resp 03/20/18 1115 18     Temp 03/20/18 1115 98.1 F (36.7 C)     Temp src --      SpO2 03/20/18 1115 100 %     Weight --      Height --      Head Circumference --      Peak Flow --      Pain Score 03/20/18 1113 8     Pain Loc --      Pain Edu? --      Excl. in GC? --    No data found.  Updated Vital Signs BP 115/78   Pulse (!) 101   Temp 98.1 F (36.7 C)   Resp 18   LMP 03/05/2018   SpO2 100%   Physical Exam  Constitutional: She is oriented to person, place, and time. She  appears well-developed and well-nourished. No distress.  HENT:  Head: Normocephalic and atraumatic.  Cardiovascular: Normal rate, regular rhythm and normal heart sounds.  Pulmonary/Chest: Effort normal and breath sounds normal. She has no wheezes.  Neurological: She is alert and oriented to person, place, and time.  Skin: She is not diaphoretic.  Has a 4 cm abscess on left lower buttock that is red, swollen with some fluctuance.   Nursing note and vitals reviewed.   UC Treatments / Results  Labs (all labs ordered are listed, but only abnormal results are displayed) Labs Reviewed  AEROBIC CULTURE (SUPERFICIAL SPECIMEN)    EKG None Radiology No results found.  Procedures Incision and Drainage Date/Time: 03/20/2018 12:06 PM Performed by: Lucia Estelle, NP Authorized by: Elvina Sidle, MD   Consent:    Consent obtained:  Verbal   Consent given by:  Patient   Risks discussed:  Infection, pain, incomplete drainage and bleeding   Alternatives discussed:  Alternative treatment Location:    Type:  Abscess   Size:  4 cm   Location: left buttock. Pre-procedure details:    Skin preparation:  Betadine Anesthesia (see MAR for exact dosages):    Anesthesia method:  Local infiltration   Local anesthetic:  Lidocaine 2% WITH epi Procedure type:    Complexity:  Simple Procedure details:    Needle aspiration: no     Incision types:  Stab incision   Drainage:  Serosanguinous   Drainage amount:  Moderate   Wound treatment:  Wound left open   Packing materials:  None Post-procedure details:    Patient tolerance of procedure:  Tolerated well, no immediate complications   (including critical care time)  Medications Ordered in UC Medications - No data to display   Initial Impression / Assessment and Plan / UC Course  I have reviewed the triage vital signs and the nursing notes.  Pertinent labs & imaging results that were available during my care of the patient were reviewed by  me and considered in  my medical decision making (see chart for details).   Final Clinical Impressions(s) / UC Diagnoses   Final diagnoses:  Abscess   Patient presents today for an abscess to her left buttock. She gets abscess often. Incision and Drainage performed. Wound care discussed. Wound culture pending. Send home with Clindamycin empirically.   ED Discharge Orders        Ordered    clindamycin (CLEOCIN) 300 MG capsule  2 times daily     03/20/18 1148     Controlled Substance Prescriptions  Controlled Substance Registry consulted? Not Applicable   Lucia EstelleZheng, Seairra Otani, NP 03/20/18 1210

## 2018-03-20 NOTE — ED Triage Notes (Signed)
Pt here for abscess to left buttocks. Denies drainage.

## 2018-03-22 LAB — AEROBIC CULTURE W GRAM STAIN (SUPERFICIAL SPECIMEN)

## 2018-03-22 LAB — AEROBIC CULTURE  (SUPERFICIAL SPECIMEN)

## 2018-04-01 ENCOUNTER — Ambulatory Visit (HOSPITAL_COMMUNITY)
Admission: EM | Admit: 2018-04-01 | Discharge: 2018-04-01 | Disposition: A | Discharge status: Home / Self Care | Payer: Self-pay | Attending: Internal Medicine | Admitting: Internal Medicine

## 2018-04-01 ENCOUNTER — Encounter (HOSPITAL_COMMUNITY): Payer: Self-pay | Admitting: Family Medicine

## 2018-04-01 DIAGNOSIS — L0291 Cutaneous abscess, unspecified: Secondary | ICD-10-CM

## 2018-04-01 MED ORDER — DOXYCYCLINE HYCLATE 100 MG PO TABS
100.0000 mg | ORAL_TABLET | Freq: Two times a day (BID) | ORAL | 0 refills | Status: DC
Start: 2018-04-01 — End: 2018-04-01

## 2018-04-01 MED ORDER — DOXYCYCLINE HYCLATE 100 MG PO TABS: 100.0000 mg | ORAL_TABLET | Freq: Two times a day (BID) | ORAL | 0 refills | Status: DC

## 2018-04-01 NOTE — ED Provider Notes (Signed)
[facility]    CSN: [id number] Arrival date & time: 04/01/18  1847     History   Chief Complaint Chief Complaint  Patient presents with  . Abscess    HPI Loretta Moore is a 21 y.o. female.   22 year old female comes in for abscess for the past week.  She has a history of hidradenitis and has had multiple plastic surgeries.  States current abscesses of the left labia.  She has been doing warm compresses without relief.  Denies fever, chills, night sweats.     Past Medical History:  Diagnosis Date  . Anemia   . Asthma   . Hard of hearing   . Hidradenitis suppurativa    had excisions in axilla and groin  . Pregnant 12/24/2014    Patient Active Problem List   Diagnosis Date Noted  . S/P cesarean section 09/01/2015  . Asthma 04/29/2015  . Smoker 04/29/2015  . Chlamydia 02/05/2015  . Hidradenitis suppurativa   . Hard of hearing     Past Surgical History:  Procedure Laterality Date  . AXILLARY HIDRADENITIS EXCISION    . CESAREAN SECTION N/A 08/29/2015   Procedure: CESAREAN SECTION;  Surgeon: Levie Heritage, DO;  Location: [facility];  Service: Obstetrics;  Laterality: N/A;  . INGUINAL HIDRADENITIS EXCISION    . TONSILLECTOMY AND ADENOIDECTOMY      OB History    Gravida  1   Para  1   Term  1   Preterm      AB      Living  1     SAB      TAB      Ectopic      Multiple  0   Live Births  1            Home Medications    Prior to Admission medications   Medication Sig Start Date End Date Taking? Authorizing Provider  albuterol (PROVENTIL HFA;VENTOLIN HFA) 108 (90 Base) MCG/ACT inhaler Inhale 1-2 puffs into the lungs every 6 (six) hours as needed for wheezing or shortness of breath.    [provider]  doxycycline (VIBRA-TABS) 100 MG tablet Take 1 tablet (100 mg total) by mouth 2 (two) times daily. 04/01/18   Belinda Fisher, PA-C    Family History Family History  Problem Relation Age of Onset  . Anemia Mother   .  Hypertension Mother   . Other Mother        hidradenitis  . Epilepsy Father   . Other Sister        hidradenitis  . Epilepsy Brother   . Other Paternal Grandmother        aneursym  . Cancer Paternal Grandfather     Social History Social History   Tobacco Use  . Smoking status: Current Every Day Smoker    Packs/day: 0.50    Years: 3.00    Pack years: 1.50    Types: Cigarettes    Last attempt to quit: 06/02/2015    Years since quitting: 2.8  . Smokeless tobacco: Never Used  Substance Use Topics  . Alcohol use: Yes    Alcohol/week: 0.0 oz    Comment: on weekends  . Drug use: No     Allergies   Bee venom; Amoxicillin; Augmentin [amoxicillin-pot clavulanate]; Ceclor [cefaclor]; Penicillins; Sulfa antibiotics; Tomato; and Other   Review of Systems Review of Systems  Reason unable to perform ROS: See HPI as above.     Physical Exam  Triage Vital Signs ED Triage Vitals [04/01/18 1931]  Enc Vitals Group     BP 138/67     Pulse Rate (!) 107     Resp 18     Temp 98.3 F (36.8 C)     Temp src      SpO2      Weight      Height      Head Circumference      Peak Flow      Pain Score      Pain Loc      Pain Edu?      Excl. in GC?    No data found.  Updated Vital Signs BP 138/67   Pulse (!) 107   Temp 98.3 F (36.8 C)   Resp 18   LMP 03/05/2018   Visual Acuity Right Eye Distance:   Left Eye Distance:   Bilateral Distance:    Right Eye Near:   Left Eye Near:    Bilateral Near:     Physical Exam  Constitutional: She is oriented to person, place, and time. She appears well-developed and well-nourished. No distress.  HENT:  Head: Normocephalic and atraumatic.  Eyes: Pupils are equal, round, and reactive to light. Conjunctivae are normal.  Genitourinary:     Neurological: She is alert and oriented to person, place, and time.     UC Treatments / Results  Labs (all labs ordered are listed, but only abnormal results are displayed) Labs Reviewed -  No data to display  EKG None Radiology No results found.  Procedures Incision and Drainage Date/Time: 04/01/2018 8:24 PM Performed by: Belinda Fisher, PA-C Authorized by: Isa Rankin, MD   Consent:    Consent obtained:  Verbal   Consent given by:  Patient   Risks discussed:  Bleeding, incomplete drainage, infection and pain   Alternatives discussed:  Referral Location:    Type:  Abscess   Size:  2cm x 3cm   Location:  Anogenital   Anogenital location:  [place] Pre-procedure details:    Skin preparation:  Betadine Anesthesia (see MAR for exact dosages):    Anesthesia method:  Local infiltration   Local anesthetic:  Lidocaine 2% WITH epi Procedure type:    Complexity:  Simple Procedure details:    Incision types:  Single straight   Scalpel blade:  11   Wound management:  Probed and deloculated and extensive cleaning   Drainage:  Bloody and purulent   Drainage amount:  Copious   Wound treatment:  Wound left open   Packing materials:  None Post-procedure details:    Patient tolerance of procedure:  Tolerated well, no immediate complications   (including critical care time)  Medications Ordered in UC Medications - No data to display   Initial Impression / Assessment and Plan / UC Course  I have reviewed the triage vital signs and the nursing notes.  Pertinent labs & imaging results that were available during my care of the patient were reviewed by me and considered in my medical decision making (see chart for details).    Patient tolerated procedure well.  Start doxycycline as directed.  Follow-up with PCP/dermatologist for further management of hidradenitis.  Return precautions given.  Patient expresses understanding and agrees to plan.  Final Clinical Impressions(s) / UC Diagnoses   Final diagnoses:  Abscess    ED Discharge Orders        Ordered    doxycycline (VIBRA-TABS) 100 MG tablet  2 times daily,  Status:  Discontinued     04/01/18 2017     doxycycline (VIBRA-TABS) 100 MG tablet  2 times daily     04/01/18 2020        Belinda Fisher, PA-C 04/01/18 2025

## 2018-04-01 NOTE — ED Triage Notes (Signed)
Pt here for abscess to left groin area.

## 2018-04-01 NOTE — Discharge Instructions (Signed)
Abscess drained today. Start doxycycline as directed. Follow up with PCP/dermatologist for further evaluation needed. Monitor for spreading redness, increased warmth, fever, follow up for reevaluation needed.

## 2018-05-30 ENCOUNTER — Ambulatory Visit (HOSPITAL_COMMUNITY)
Admission: EM | Admit: 2018-05-30 | Discharge: 2018-05-30 | Disposition: A | Discharge status: Home / Self Care | Payer: Self-pay | Attending: Urgent Care | Admitting: Urgent Care

## 2018-05-30 ENCOUNTER — Encounter (HOSPITAL_COMMUNITY): Payer: Self-pay

## 2018-05-30 DIAGNOSIS — L02415 Cutaneous abscess of right lower limb: Secondary | ICD-10-CM

## 2018-05-30 DIAGNOSIS — Z882 Allergy status to sulfonamides status: Secondary | ICD-10-CM | POA: Insufficient documentation

## 2018-05-30 DIAGNOSIS — Z880 Allergy status to penicillin: Secondary | ICD-10-CM | POA: Insufficient documentation

## 2018-05-30 DIAGNOSIS — J45909 Unspecified asthma, uncomplicated: Secondary | ICD-10-CM | POA: Insufficient documentation

## 2018-05-30 DIAGNOSIS — Z888 Allergy status to other drugs, medicaments and biological substances status: Secondary | ICD-10-CM | POA: Insufficient documentation

## 2018-05-30 DIAGNOSIS — L732 Hidradenitis suppurativa: Secondary | ICD-10-CM | POA: Insufficient documentation

## 2018-05-30 DIAGNOSIS — Z881 Allergy status to other antibiotic agents status: Secondary | ICD-10-CM | POA: Insufficient documentation

## 2018-05-30 DIAGNOSIS — L02211 Cutaneous abscess of abdominal wall: Secondary | ICD-10-CM | POA: Insufficient documentation

## 2018-05-30 MED ORDER — DOXYCYCLINE HYCLATE 100 MG PO CAPS: 100.0000 mg | ORAL_CAPSULE | Freq: Two times a day (BID) | ORAL | 0 refills | Status: DC

## 2018-05-30 MED ORDER — LIDOCAINE-EPINEPHRINE 2 %-1:200000 IJ SOLN
INTRAMUSCULAR | Status: AC
Start: 2018-05-30 — End: ?
  Filled 2018-05-30: qty 20

## 2018-05-30 NOTE — Discharge Instructions (Addendum)
I would recommend you contact [facility] for a consult on your recurrent abscesses.  Change her dressing daily and use the half-inch packing.  Each time that you pack the wound, make sure that you use at least 1 inch less than the previous time.  You may use Tylenol and/or ibuprofen for pain and inflammation.  If you develop fever, chills, vomiting, worsening pain then please report back here or to the ER as you may have a worsening infection.

## 2018-05-30 NOTE — ED Provider Notes (Signed)
  MRN: [id number] DOB: 1996-08-02  Subjective:   Loretta Moore is a 22 y.o. female presenting for 2 week history of left flank abscess. Has felt warmth over the area, slight drainage. Has been using warm compresses but has gotten significantly worse in the past week. Has also had nausea without vomiting. Has a history of hidradenitis suppurativa. She has obtained a consult with a plastic surgeon for the same. Did not recommend surgical intervention.   No current facility-administered medications for this encounter.   Current Outpatient Medications:  .  albuterol (PROVENTIL HFA;VENTOLIN HFA) 108 (90 Base) MCG/ACT inhaler, Inhale 1-2 puffs into the lungs every 6 (six) hours as needed for wheezing or shortness of breath., Disp: , Rfl:    Allergies  Allergen Reactions  . Bee Venom Anaphylaxis  . Amoxicillin Hives  . Augmentin [Amoxicillin-Pot Clavulanate] Hives  . Ceclor [Cefaclor] Hives  . Penicillins Hives    Has patient had a PCN reaction causing immediate rash, facial/tongue/throat swelling, SOB or lightheadedness with hypotension: Yes Has patient had a PCN reaction causing severe rash involving mucus membranes or skin necrosis: No Has patient had a PCN reaction that required hospitalization No Has patient had a PCN reaction occurring within the last 10 years: No If all of the above answers are "NO", then may proceed with Cephalosporin use.   . Sulfa Antibiotics Hives  . Tomato Hives  . Other Rash    Paper tape causes rash    Past Medical History:  Diagnosis Date  . Anemia   . Asthma   . Hard of hearing   . Hidradenitis suppurativa    had excisions in axilla and groin  . Pregnant 12/24/2014     Past Surgical History:  Procedure Laterality Date  . AXILLARY HIDRADENITIS EXCISION    . CESAREAN SECTION N/A 08/29/2015   Procedure: CESAREAN SECTION;  Surgeon: Levie HeritageJacob J Stinson, DO;  Location: [facility];  Service: Obstetrics;  Laterality: N/A;  . INGUINAL HIDRADENITIS EXCISION    .  TONSILLECTOMY AND ADENOIDECTOMY      Objective:   Vitals: BP 95/67 (BP Location: Left Arm)   Pulse 76   Temp 98.7 F (37.1 C) (Oral)   Resp 20   LMP 05/27/2018   SpO2 100%   Physical Exam  Constitutional: She is oriented to person, place, and time. She appears well-developed and well-nourished.  Cardiovascular: Normal rate.  Pulmonary/Chest: Effort normal.  Neurological: She is alert and oriented to person, place, and time.  Skin:      PROCEDURE NOTE: I&D of Abscess Verbal consent obtained. Local anesthesia with 2cc of 2% lidocaine with epinephrine. Site cleansed with Betadine. Incision of 1cm was made using a 11 blade, discharge of copious amounts of pus and serosanguinous fluid. Wound cavity was explored with curved hemostats and packed with 1/2" plain packing. Cleansed and dressed.   Assessment and Plan :   Abscess of skin of abdomen  Hidradenitis suppurativa  Wound care reviewed.  Patient is to contact general surgery as I suspect she has tracking as evidenced by her physical exam findings.  For now she will take doxycycline and perform dressing changes at home, wound culture pending.  ER and return to clinic precautions provided.     Wallis BambergMani, Valentine Kuechle, PA-C 05/30/18 1600

## 2018-05-30 NOTE — ED Triage Notes (Signed)
Pt states she has an abcess on her left stomach area that needs draining and she also has one on her left thigh area.

## 2018-06-02 LAB — AEROBIC CULTURE W GRAM STAIN (SUPERFICIAL SPECIMEN)

## 2018-06-05 ENCOUNTER — Telehealth (HOSPITAL_COMMUNITY): Payer: Self-pay

## 2018-06-05 NOTE — Telephone Encounter (Signed)
Attempted to reach patient regarding results and assess how the patient was feeling. No answer at this time. 

## 2018-06-11 ENCOUNTER — Ambulatory Visit: Payer: Self-pay | Admitting: Surgery

## 2018-06-11 NOTE — H&P (Signed)
Loretta Moore Documented: 06/11/2018 11:47 AM Location: [facility] Patient #: [id number] DOB: 12-Dec-1995 Single / Language: [place] / Race: Black or African American Female  History of Present Illness Loretta Fus A. Rashi Giuliani MD; 06/11/2018 12:06 PM) Patient words: Patient's ends at the request of Wallis Bamberg pa hidradenitis of her left flank. 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 She's had multiple operations at [facility] the past.. Her left flank is needed. This developed in the last few weeks to months. It is sore and was draining but is better now since she underwent incision and drainage in the emergency room 2 weeks ago.  The patient is a 22 year old female.   Past Surgical History Loretta Moore, CMA; 06/11/2018 11:47 AM) Cesarean Section - 1 Tonsillectomy  Diagnostic Studies History Loretta Moore, CMA; 06/11/2018 11:47 AM) Colonoscopy never Mammogram never Pap Smear 1-5 years ago  Allergies Loretta Moore, CMA; 06/11/2018 11:49 AM) Penicillin G Procaine *PENICILLINS* Augmentin *PENICILLINS* Ceclor *CEPHALOSPORINS* Sulfa 10 *OPHTHALMIC AGENTS*  Medication History (Michelle R. Moore, CMA; 06/11/2018 11:49 AM) No Current Medications Medications Reconciled  Social History Loretta Moore, CMA; 06/11/2018 11:47 AM) Alcohol use Moderate alcohol use. Caffeine use Coffee. No drug use Tobacco use Current every day smoker.  Family History Loretta Moore, CMA; 06/11/2018 11:47 AM) Alcohol Abuse Father. Bleeding disorder Mother. Depression Father, Mother. Heart Disease Mother. Heart disease in female family member before age 47 Hypertension Mother. Seizure disorder Brother, Father.  Pregnancy / Birth History Loretta Moore, CMA; 06/11/2018 11:47 AM) Age at menarche 8 years. Gravida 1 Maternal age 87-20 Para 1 Regular periods  Other Problems Loretta Moore, CMA;  06/11/2018 11:47 AM) Asthma Gastroesophageal Reflux Disease Migraine Headache Transfusion history     Review of Systems [facility] R. Moore CMA; 06/11/2018 11:47 AM) Skin Present- New Lesions. Not Present- Change in Wart/Mole, Dryness, Hives, Jaundice, Non-Healing Wounds, Rash and Ulcer. HEENT Not Present- Earache, Hearing Loss, Hoarseness, Nose Bleed, Oral Ulcers, Ringing in the Ears, Seasonal Allergies, Sinus Pain, Sore Throat, Visual Disturbances, Wears glasses/contact lenses and Yellow Eyes. Respiratory Not Present- Bloody sputum, Chronic Cough, Difficulty Breathing, Snoring and Wheezing. Breast Not Present- Breast Mass, Breast Pain, Nipple Discharge and Skin Changes. Cardiovascular Not Present- Chest Pain, Difficulty Breathing Lying Down, Leg Cramps, Palpitations, Rapid Heart Rate, Shortness of Breath and Swelling of Extremities. Gastrointestinal Not Present- Abdominal Pain, Bloating, Bloody Stool, Change in Bowel Habits, Chronic diarrhea, Constipation, Difficulty Swallowing, Excessive gas, Gets full quickly at meals, Hemorrhoids, Indigestion, Nausea, Rectal Pain and Vomiting. Neurological Not Present- Decreased Memory, Fainting, Headaches, Numbness, Seizures, Tingling, Tremor, Trouble walking and Weakness. Endocrine Not Present- Cold Intolerance, Excessive Hunger, Hair Changes, Heat Intolerance, Hot flashes and New Diabetes. Hematology Present- Persistent Infections. Not Present- Blood Thinners, Easy Bruising, Excessive bleeding, Gland problems and HIV.  Vitals [employer] R. Moore CMA; 06/11/2018 11:47 AM) 06/11/2018 11:47 AM Weight: 223 lb Height: 5in Body Surface Area: 0.32 m Body Mass Index: 6271.38 kg/m  BP: 132/84 (Sitting, Left Arm, Standard)      Physical Exam (Marcelo Ickes A. Evert Wenrich MD; 06/11/2018 12:10 PM)  General Mental Status-Alert. General Appearance-Consistent with stated age. Hydration-Well hydrated. Voice-Normal.  Integumentary Note: Multiple areas  of scarring in the perineum, inner thighs and upper back from hidradenitis excisions the past. Left flank shows a 7 x 3 cm strip of hidradenitis without any signs of active infection today.  Eye Eyeball - Bilateral-Extraocular movements intact. Sclera/Conjunctiva - Bilateral-No scleral icterus.  Chest  and Lung Exam Chest and lung exam reveals -quiet, even and easy respiratory effort with no use of accessory muscles and on auscultation, normal breath sounds, no adventitious sounds and normal vocal resonance. Inspection Chest Wall - Normal. Back - normal.  Cardiovascular Cardiovascular examination reveals -normal heart sounds, regular rate and rhythm with no murmurs and normal pedal pulses bilaterally.  Neurologic Neurologic evaluation reveals -alert and oriented x 3 with no impairment of recent or remote memory. Mental Status-Normal.  Musculoskeletal Normal Exam - Left-Upper Extremity Strength Normal and Lower Extremity Strength Normal. Normal Exam - Right-Upper Extremity Strength Normal and Lower Extremity Strength Normal.    Assessment & Plan (Antrone Walla A. Jovanni Eckhart MD; 06/11/2018 12:08 PM)  HIDRADENITIS (L73.2) Impression: left flank recommend excision due to recurrent active disease pt desires that it be packed instead of closed Risk of bleeding, infection, recurrence, wound palpitations, and the need for further surgery discussed. She's had multiple operations for this disease process so she is well educated.  Current Plans The anatomy and physiology of sweat glands and skin creases was discussed. Pathophysiology of hidradenitis suppurativa was discussed. I stressed good hygiene, avoiding razors and avoiding antiperspirants. Consideration of excision with primary closure versus need to place a drain, open packing, possible skin flaps/grafts was discussed.  Possibility of recurrence was discussed. Risks, benefits, alternatives were discussed. I noted a good likelihood  this will help address the problem. Risks of anesthesia and other risks discussed. Questions answered. The patient is considering surgery. They wish to proceed.  You are being scheduled for surgery- Our schedulers will call you.  You should hear from our office's scheduling department within 5 working days about the location, date, and time of surgery. We try to make accommodations for patient's preferences in scheduling surgery, but sometimes the OR schedule or the surgeon's schedule prevents us from making those accommodations.  If you have not heard from our office [phone]) in 5 working days, call the office and ask for your surgeon's nurse.  If you have other questions about your diagnosis, plan, or surgery, call the office and ask for your surgeon's nurse.  Pt Education - [facility] General Post-op HCI

## 2018-07-16 ENCOUNTER — Ambulatory Visit (HOSPITAL_COMMUNITY)
Admission: EM | Admit: 2018-07-16 | Discharge: 2018-07-16 | Disposition: A | Discharge status: Home / Self Care | Payer: Self-pay | Attending: Family Medicine | Admitting: Family Medicine

## 2018-07-16 ENCOUNTER — Encounter (HOSPITAL_COMMUNITY): Payer: Self-pay

## 2018-07-16 DIAGNOSIS — L02219 Cutaneous abscess of trunk, unspecified: Secondary | ICD-10-CM

## 2018-07-16 DIAGNOSIS — L0291 Cutaneous abscess, unspecified: Secondary | ICD-10-CM

## 2018-07-16 MED ORDER — DOXYCYCLINE HYCLATE 100 MG PO CAPS: 100.0000 mg | ORAL_CAPSULE | Freq: Two times a day (BID) | ORAL | 0 refills | Status: DC

## 2018-07-16 NOTE — ED Notes (Signed)
Pt discharged by provider.

## 2018-07-16 NOTE — ED Provider Notes (Signed)
[facility]    CSN: [id number] Arrival date & time: 07/16/18  1600     History   Chief Complaint Chief Complaint  Patient presents with  . Abscess    HPI Loretta Moore is a 22 y.o. female.   Loretta Moore presents with complaints of pain to abscess to right flank. This is a chronic issue for her, hx of HS. Has seen plastics with surgery recommended, which patient cannot currently afford to take time off of work for. I&D's effective for flairs. No current drainage. Has been applying warm compresses which have not helped. No fevers. Mild nausea. Has been on antibiotics for this, it never completely goes away but pain does improved.     ROS per HPI.      Past Medical History:  Diagnosis Date  . Anemia   . Asthma   . Hard of hearing   . Hidradenitis suppurativa    had excisions in axilla and groin  . Pregnant 12/24/2014    Patient Active Problem List   Diagnosis Date Noted  . S/P cesarean section 09/01/2015  . Asthma 04/29/2015  . Smoker 04/29/2015  . Chlamydia 02/05/2015  . Hidradenitis suppurativa   . Hard of hearing     Past Surgical History:  Procedure Laterality Date  . AXILLARY HIDRADENITIS EXCISION    . CESAREAN SECTION N/A 08/29/2015   Procedure: CESAREAN SECTION;  Surgeon: Levie HeritageJacob J Stinson, DO;  Location: [facility];  Service: Obstetrics;  Laterality: N/A;  . INGUINAL HIDRADENITIS EXCISION    . TONSILLECTOMY AND ADENOIDECTOMY      OB History    Gravida  1   Para  1   Term  1   Preterm      AB      Living  1     SAB      TAB      Ectopic      Multiple  0   Live Births  1            Home Medications    Prior to Admission medications   Medication Sig Start Date End Date Taking? Authorizing Provider  albuterol (PROVENTIL HFA;VENTOLIN HFA) 108 (90 Base) MCG/ACT inhaler Inhale 1-2 puffs into the lungs every 6 (six) hours as needed for wheezing or shortness of breath.    [provider]  doxycycline (VIBRAMYCIN) 100 MG  capsule Take 1 capsule (100 mg total) by mouth 2 (two) times daily. 07/16/18   Georgetta HaberBurky, Milus Fritze B, NP    Family History Family History  Problem Relation Age of Onset  . Anemia Mother   . Hypertension Mother   . Other Mother        hidradenitis  . Epilepsy Father   . Other Sister        hidradenitis  . Epilepsy Brother   . Other Paternal Grandmother        aneursym  . Cancer Paternal Grandfather     Social History Social History   Tobacco Use  . Smoking status: Current Every Day Smoker    Packs/day: 0.50    Years: 3.00    Pack years: 1.50    Types: Cigarettes    Last attempt to quit: 06/02/2015    Years since quitting: 3.1  . Smokeless tobacco: Never Used  Substance Use Topics  . Alcohol use: Yes    Alcohol/week: 0.0 standard drinks    Comment: on weekends  . Drug use: No     Allergies   Bee  venom; Amoxicillin; Augmentin [amoxicillin-pot clavulanate]; Ceclor [cefaclor]; Penicillins; Sulfa antibiotics; Tomato; and Other   Review of Systems Review of Systems   Physical Exam Triage Vital Signs ED Triage Vitals [07/16/18 1717]  Enc Vitals Group     BP (!) 118/57     Pulse Rate (!) 107     Resp 18     Temp 97.6 F (36.4 C)     Temp src      SpO2 95 %     Weight      Height      Head Circumference      Peak Flow      Pain Score      Pain Loc      Pain Edu?      Excl. in GC?    No data found.  Updated Vital Signs BP (!) 118/57   Pulse (!) 107   Temp 97.6 F (36.4 C)   Resp 18   LMP 06/23/2018   SpO2 95%   Visual Acuity Right Eye Distance:   Left Eye Distance:   Bilateral Distance:    Right Eye Near:   Left Eye Near:    Bilateral Near:     Physical Exam  Constitutional: She is oriented to person, place, and time. She appears well-developed and well-nourished. No distress.  Cardiovascular: Regular rhythm and normal heart sounds. Tachycardia present.  Pulmonary/Chest: Effort normal and breath sounds normal.  Abdominal:    Scaring and  mildly fluctuant abscess, deep, approximately 2 cm in length with mild surrounding redness. No induration   Neurological: She is alert and oriented to person, place, and time.  Skin: Skin is warm and dry.     UC Treatments / Results  Labs (all labs ordered are listed, but only abnormal results are displayed) Labs Reviewed - No data to display  EKG None  Radiology No results found.  Procedures Incision and Drainage Date/Time: 07/16/2018 6:02 PM Performed by: Georgetta Haber, NP Authorized by: Eustace Moore, MD   Consent:    Consent obtained:  Verbal   Consent given by:  Patient   Risks discussed:  Pain, incomplete drainage, infection and bleeding   Alternatives discussed:  No treatment, observation and referral Location:    Type:  Abscess   Size:  2   Location:  Trunk   Trunk location:  Abdomen (right flank ) Pre-procedure details:    Skin preparation:  Betadine Anesthesia (see MAR for exact dosages):    Anesthesia method:  Local infiltration   Local anesthetic:  Lidocaine 2% WITH epi Procedure type:    Complexity:  Simple Procedure details:    Incision types:  Single straight   Scalpel blade:  11   Wound management:  Probed and deloculated   Drainage:  Purulent   Drainage amount:  Moderate   Wound treatment:  Wound left open   Packing materials:  None Post-procedure details:    Patient tolerance of procedure:  Tolerated well, no immediate complications   (including critical care time)  Medications Ordered in UC Medications - No data to display  Initial Impression / Assessment and Plan / UC Course  I have reviewed the triage vital signs and the nursing notes.  Pertinent labs & imaging results that were available during my care of the patient were reviewed by me and considered in my medical decision making (see chart for details).     Patient feels improved already s/p I&d with large output. No packign placed. Course of  doxy provided. Wound care  education provided. Follow up with pcp and/or surgeon. Return precautions provided. Patient verbalized understanding and agreeable to plan.    Final Clinical Impressions(s) / UC Diagnoses   Final diagnoses:  Abscess     Discharge Instructions     Warm compresses to promote further drainage.  Complete course of antibiotics.   Wash daily with soap and water.  Continue to follow up with your primary care provider and/or surgeon for further management as needed.    ED Prescriptions    Medication Sig Dispense Auth. Provider   doxycycline (VIBRAMYCIN) 100 MG capsule Take 1 capsule (100 mg total) by mouth 2 (two) times daily. 20 capsule Georgetta HaberBurky, Kwanza Cancelliere B, NP     Controlled Substance Prescriptions [place] Controlled Substance Registry consulted? Not Applicable   Georgetta HaberBurky, Zyiah Withington B, NP 07/16/18 [phone]

## 2018-07-16 NOTE — ED Triage Notes (Signed)
Pt presents with having an abscess on her left flank that she needs to have looked at. History of same.

## 2018-07-16 NOTE — Discharge Instructions (Signed)
Warm compresses to promote further drainage.  Complete course of antibiotics.   Wash daily with soap and water.  Continue to follow up with your primary care provider and/or surgeon for further management as needed.

## 2018-09-06 ENCOUNTER — Ambulatory Visit (HOSPITAL_COMMUNITY)
Admission: EM | Admit: 2018-09-06 | Discharge: 2018-09-06 | Disposition: A | Discharge status: Home / Self Care | Payer: Self-pay | Attending: Family Medicine | Admitting: Family Medicine

## 2018-09-06 ENCOUNTER — Encounter (HOSPITAL_COMMUNITY): Payer: Self-pay | Admitting: Emergency Medicine

## 2018-09-06 DIAGNOSIS — L732 Hidradenitis suppurativa: Secondary | ICD-10-CM

## 2018-09-06 MED ORDER — DOXYCYCLINE HYCLATE 100 MG PO CAPS: 100.0000 mg | ORAL_CAPSULE | Freq: Two times a day (BID) | ORAL | 0 refills | Status: DC

## 2018-09-06 NOTE — ED Triage Notes (Signed)
Pt sts abscess in axillary area with hx of same

## 2018-09-06 NOTE — ED Provider Notes (Signed)
[facility]    CSN: [id number] Arrival date & time: 09/06/18  1144     History   Chief Complaint Chief Complaint  Patient presents with  . Abscess    HPI Loretta Moore is a 22 y.o. female.   Pt is a 22 year old female with chronic hidradenitis. She is here with 3 days of tenderness, swelling and erythema to the right axilla. Pt has a significant amount of scar tissue to the area from previous abscesses. She denies any fever, chill, body aches, fatigue. It became worse upon waking this am. No drainage.  ROS per HPI      Past Medical History:  Diagnosis Date  . Anemia   . Asthma   . Hard of hearing   . Hidradenitis suppurativa    had excisions in axilla and groin  . Pregnant 12/24/2014    Patient Active Problem List   Diagnosis Date Noted  . S/P cesarean section 09/01/2015  . Asthma 04/29/2015  . Smoker 04/29/2015  . Chlamydia 02/05/2015  . Hidradenitis suppurativa   . Hard of hearing     Past Surgical History:  Procedure Laterality Date  . AXILLARY HIDRADENITIS EXCISION    . CESAREAN SECTION N/A 08/29/2015   Procedure: CESAREAN SECTION;  Surgeon: Levie Heritage, DO;  Location: [facility];  Service: Obstetrics;  Laterality: N/A;  . INGUINAL HIDRADENITIS EXCISION    . TONSILLECTOMY AND ADENOIDECTOMY      OB History    Gravida  1   Para  1   Term  1   Preterm      AB      Living  1     SAB      TAB      Ectopic      Multiple  0   Live Births  1            Home Medications    Prior to Admission medications   Medication Sig Start Date End Date Taking? Authorizing Provider  albuterol (PROVENTIL HFA;VENTOLIN HFA) 108 (90 Base) MCG/ACT inhaler Inhale 1-2 puffs into the lungs every 6 (six) hours as needed for wheezing or shortness of breath.    [provider]  doxycycline (VIBRAMYCIN) 100 MG capsule Take 1 capsule (100 mg total) by mouth 2 (two) times daily. 09/06/18   Janace Aris, NP    Family History Family  History  Problem Relation Age of Onset  . Anemia Mother   . Hypertension Mother   . Other Mother        hidradenitis  . Epilepsy Father   . Other Sister        hidradenitis  . Epilepsy Brother   . Other Paternal Grandmother        aneursym  . Cancer Paternal Grandfather     Social History Social History   Tobacco Use  . Smoking status: Current Every Day Smoker    Packs/day: 0.50    Years: 3.00    Pack years: 1.50    Types: Cigarettes    Last attempt to quit: 06/02/2015    Years since quitting: 3.2  . Smokeless tobacco: Never Used  Substance Use Topics  . Alcohol use: Yes    Alcohol/week: 0.0 standard drinks    Comment: on weekends  . Drug use: No     Allergies   Bee venom; Amoxicillin; Augmentin [amoxicillin-pot clavulanate]; Ceclor [cefaclor]; Penicillins; Sulfa antibiotics; Tomato; and Other   Review of Systems Review of Systems  Physical Exam Triage Vital Signs ED Triage Vitals [09/06/18 1203]  Enc Vitals Group     BP 129/78     Pulse Rate 96     Resp 18     Temp 99.1 F (37.3 C)     Temp Source Oral     SpO2 99 %     Weight      Height      Head Circumference      Peak Flow      Pain Score      Pain Loc      Pain Edu?      Excl. in GC?    No data found.  Updated Vital Signs BP 129/78 (BP Location: Left Arm)   Pulse 96   Temp 99.1 F (37.3 C) (Oral)   Resp 18   SpO2 99%   Visual Acuity Right Eye Distance:   Left Eye Distance:   Bilateral Distance:    Right Eye Near:   Left Eye Near:    Bilateral Near:     Physical Exam  Constitutional: She is oriented to person, place, and time. She appears well-developed and well-nourished.  Very pleasant. Non toxic or ill appearing.     HENT:  Head: Normocephalic and atraumatic.  Eyes: Conjunctivae are normal.  Neck: Normal range of motion.  Pulmonary/Chest: Effort normal.  Musculoskeletal: Normal range of motion.  Neurological: She is alert and oriented to person, place, and time.    Skin: Skin is warm and dry.     Mild erythema and swelling to right axilla. A lot of scar tissue present.  Some induration felt but no fluctuance.   Psychiatric: She has a normal mood and affect.  Nursing note and vitals reviewed.    UC Treatments / Results  Labs (all labs ordered are listed, but only abnormal results are displayed) Labs Reviewed - No data to display  EKG None  Radiology No results found.  Procedures Procedures (including critical care time)  Medications Ordered in UC Medications - No data to display  Initial Impression / Assessment and Plan / UC Course  I have reviewed the triage vital signs and the nursing notes.  Pertinent labs & imaging results that were available during my care of the patient were reviewed by me and considered in my medical decision making (see chart for details).     No I&D warranted today. Will treat with warm compresses and doxycycline Instructed to return if the abscess appears  drainable  Follow up as needed for continued or worsening symptoms  Final Clinical Impressions(s) / UC Diagnoses   Final diagnoses:  Hidradenitis suppurativa     Discharge Instructions     It was nice meeting you!!  We will treat with warm compresses and antibiotics today Follow-up as needed if the abscess does not improve or becomes drainable. Follow up as needed for continued or worsening symptoms      ED Prescriptions    Medication Sig Dispense Auth. Provider   doxycycline (VIBRAMYCIN) 100 MG capsule Take 1 capsule (100 mg total) by mouth 2 (two) times daily. 20 capsule Dahlia Byes A, NP     Controlled Substance Prescriptions [place] Controlled Substance Registry consulted? Not Applicable   Janace Aris, NP 09/06/18 1229

## 2018-09-06 NOTE — Discharge Instructions (Addendum)
It was nice meeting you!!  We will treat with warm compresses and antibiotics today Follow-up as needed if the abscess does not improve or becomes drainable. Follow up as needed for continued or worsening symptoms

## 2018-11-06 DIAGNOSIS — L02211 Cutaneous abscess of abdominal wall: Secondary | ICD-10-CM | POA: Diagnosis not present

## 2019-01-07 ENCOUNTER — Ambulatory Visit: Payer: Self-pay | Admitting: Surgery

## 2019-01-07 DIAGNOSIS — L732 Hidradenitis suppurativa: Secondary | ICD-10-CM | POA: Diagnosis not present

## 2019-01-07 NOTE — H&P (Signed)
Loretta Moore Documented: 01/07/2019 3:20 PM Location: [facility] Patient #: [id number] DOB: 08/26/1996 Single / Language: English / Race: Black or African American Female  History of Present Illness (Yana Schorr A. Rodrecus Belsky MD; 01/07/2019 3:31 PM) Patient words: Patient returns for follow-up of hidradenitis. She was seen last year but never scheduled surgery. She has 2 areas involving her left and right flank measuring roughly 10 cm x 3 cm they're symmetrical. She has smaller areas on her medial aspect of her thigh and gluteus region that measured 3-4 cm. The third lesion is over her pilonidal region as well. These are less symptomatic in the areas in her flank for more synthetic. She's been seen before was set up for surgery in her schedule. She desires excision. She states she does not want the wounds closed and wants them packed.  The patient is a 22 year old female.   Problem List/Past Medical (Michelle R. Brooks, CMA; 01/07/2019 3:21 PM) HIDRADENITIS (L73.2)  Past Surgical History (Michelle R. Brooks, CMA; 01/07/2019 3:21 PM) Cesarean Section - 1 Tonsillectomy  Diagnostic Studies History (Michelle R. Brooks, CMA; 01/07/2019 3:21 PM) Colonoscopy never Mammogram never Pap Smear 1-5 years ago  Allergies (Michelle R. Brooks, CMA; 01/07/2019 3:21 PM) Penicillin G Procaine *PENICILLINS* Augmentin *PENICILLINS* Ceclor *CEPHALOSPORINS* Sulfa 10 *OPHTHALMIC AGENTS*  Medication History (Michelle R. Brooks, CMA; 01/07/2019 3:21 PM) No Current Medications Medications Reconciled  Social History (Michelle R. Brooks, CMA; 01/07/2019 3:21 PM) Alcohol use Moderate alcohol use. Caffeine use Coffee. No drug use Tobacco use Current every day smoker.  Family History (Michelle R. Brooks, CMA; 01/07/2019 3:21 PM) Alcohol Abuse Father. Bleeding disorder Mother. Depression Father, Mother. Heart Disease Mother. Heart disease in female family member before age  65 Hypertension Mother. Seizure disorder Brother, Father.  Pregnancy / Birth History (Michelle R. Brooks, CMA; 01/07/2019 3:21 PM) Age at menarche 8 years. Gravida 1 Maternal age 15-20 Para 1 Regular periods  Other Problems (Michelle R. Brooks, CMA; 01/07/2019 3:21 PM) Asthma Gastroesophageal Reflux Disease Migraine Headache Transfusion history    Vitals (Michelle R. Brooks CMA; 01/07/2019 3:20 PM) 01/07/2019 3:20 PM Weight: 222.25 lb Height: 58in Body Surface Area: 1.9 m Body Mass Index: 46.45 kg/m  BP: 118/72 (Sitting, Left Arm, Standard)      Physical Exam (Cem Kosman A. [place] Dube MD; 01/07/2019 3:32 PM)  General Mental Status-Alert. General Appearance-Consistent with stated age. Hydration-Well hydrated. Voice-Normal.  Integumentary Note: Left right flank shows a roughly an area of hidradenitis involving 10 cm x 3 cm. No signs of active infection. Over her medial thigh proximal gluteal regions are 2 x 3 cm areas of hidradenitis with no drainage. Over her gluteal cleft 2 x 3 cm area of hidradenitis done showing active infection.  Chest and Lung Exam Chest and lung exam reveals -quiet, even and easy respiratory effort with no use of accessory muscles and on auscultation, normal breath sounds, no adventitious sounds and normal vocal resonance. Inspection Chest Wall - Normal. Back - normal.  Neurologic Neurologic evaluation reveals -alert and oriented x 3 with no impairment of recent or remote memory. Mental Status-Normal.    Assessment & Plan (Tomasina Keasling A. Ziaire Bieser MD; 01/07/2019 3:32 PM)  HIDRADENITIS (L73.2) Impression: bilateral flank 10 cmx 3 cm recommend excision due to recurrent active disease pt desires that it be packed instead of closed Risk of bleeding, infection, recurrence, wound palpitations, and the need for further surgery discussed. She's had multiple operations for this disease process so she is well educated.  I told her we   D  with other areas of the time and focus in the flank area since the most symptomatic.  Current Plans The pathophysiology of skin & subcutaneous masses was discussed. Natural history risks without surgery were discussed. I recommended surgery to remove the mass. I explained the technique of removal with use of local anesthesia & possible need for more aggressive sedation/anesthesia for patient comfort.  Risks such as bleeding, infection, wound breakdown, heart attack, death, and other risks were discussed. I noted a good likelihood this will help address the problem. Possibility that this will not correct all symptoms was explained. Possibility of regrowth/recurrence of the mass was discussed. We will work to minimize complications. Questions were answered. The patient expresses understanding & wishes to proceed with surgery.  You are being scheduled for surgery- Our schedulers will call you.  You should hear from our office's scheduling department within 5 working days about the location, date, and time of surgery. We try to make accommodations for patient's preferences in scheduling surgery, but sometimes the OR schedule or the surgeon's schedule prevents [place] from making those accommodations.  If you have not heard from our office [phone]) in 5 working days, call the office and ask for your surgeon's nurse.  If you have other questions about your diagnosis, plan, or surgery, call the office and ask for your surgeon's nurse.  The anatomy and physiology of sweat glands and skin creases was discussed. Pathophysiology of hidradenitis suppurativa was discussed. I stressed good hygiene, avoiding razors and avoiding antiperspirants. Consideration of excision with primary closure versus need to place a drain, open packing, possible skin flaps/grafts was discussed.  Possibility of recurrence was discussed. Risks, benefits, alternatives were discussed. I noted a good likelihood this will help  address the problem. Risks of anesthesia and other risks discussed. Questions answered. The patient is considering surgery. They wish to proceed.  Pt Education - [facility] Free Text Education/Instructions: discussed with patient and provided information.

## 2019-01-07 NOTE — H&P (View-Only) (Signed)
Loretta Moore Documented: 01/07/2019 3:20 PM [facility] Patient #: [id number] DOB: 1996/09/21 Single / Language: [place] / Race: Black or African American Female  History of Present Illness Loretta Moore A. [name] MD; 01/07/2019 3:31 PM) Patient words: Patient returns for follow-up of hidradenitis. She was seen last year but never scheduled surgery. She has 2 areas involving her left and right flank measuring roughly 10 cm x 3 cm they're symmetrical. She has smaller areas on her medial aspect of her thigh and gluteus region that measured 3-4 cm. The third lesion is over her pilonidal region as well. These are less symptomatic in the areas in her flank for more synthetic. She's been seen before was set up for surgery in her schedule. She desires excision. She states she does not want the wounds closed and wants them packed.  The patient is a 23 year old female.   Problem List/Past Medical Loretta Moore, CMA; 01/07/2019 3:21 PM) HIDRADENITIS (L73.2)  Past Surgical History Loretta Moore, CMA; 01/07/2019 3:21 PM) Cesarean Section - 1 Tonsillectomy  Diagnostic Studies History Loretta Moore, CMA; 01/07/2019 3:21 PM) Colonoscopy never Mammogram never Pap Smear 1-5 years ago  Allergies Loretta Moore, CMA; 01/07/2019 3:21 PM) Penicillin G Procaine *PENICILLINS* Augmentin *PENICILLINS* Ceclor *CEPHALOSPORINS* Sulfa 10 *OPHTHALMIC AGENTS*  Medication History (Michelle R. Moore, CMA; 01/07/2019 3:21 PM) No Current Medications Medications Reconciled  Social History Loretta Moore, CMA; 01/07/2019 3:21 PM) Alcohol use Moderate alcohol use. Caffeine use Coffee. No drug use Tobacco use Current every day smoker.  Family History Loretta Moore, CMA; 01/07/2019 3:21 PM) Alcohol Abuse Father. Bleeding disorder Mother. Depression Father, Mother. Heart Disease Mother. Heart disease in female family member before age  48 Hypertension Mother. Seizure disorder Brother, Father.  Pregnancy / Birth History Loretta Moore, CMA; 01/07/2019 3:21 PM) Age at menarche 8 years. Gravida 1 Maternal age 45-20 Para 1 Regular periods  Other Problems Loretta Moore, CMA; 01/07/2019 3:21 PM) Asthma Gastroesophageal Reflux Disease Migraine Headache Transfusion history    Vitals Loretta Moore CMA; 01/07/2019 3:20 PM) 01/07/2019 3:20 PM Weight: 222.25 lb Height: 58in Body Surface Area: 1.9 m Body Mass Index: 46.45 kg/m  BP: 118/72 (Sitting, Left Arm, Standard)      Physical Exam (Loretta Colavito A. Clements Toro MD; 01/07/2019 3:32 PM)  General Mental Status-Alert. General Appearance-Consistent with stated age. Hydration-Well hydrated. Voice-Normal.  Integumentary Note: Left right flank shows a roughly an area of hidradenitis involving 10 cm x 3 cm. No signs of active infection. Over her medial thigh proximal gluteal regions are 2 x 3 cm areas of hidradenitis with no drainage. Over her gluteal cleft 2 x 3 cm area of hidradenitis done showing active infection.  Chest and Lung Exam Chest and lung exam reveals -quiet, even and easy respiratory effort with no use of accessory muscles and on auscultation, normal breath sounds, no adventitious sounds and normal vocal resonance. Inspection Chest Wall - Normal. Back - normal.  Neurologic Neurologic evaluation reveals -alert and oriented x 3 with no impairment of recent or remote memory. Mental Status-Normal.    Assessment & Plan (Loretta Windish A. Sirinity Outland MD; 01/07/2019 3:32 PM)  HIDRADENITIS (L73.2) Impression: bilateral flank 10 cmx 3 cm recommend excision due to recurrent active disease pt desires that it be packed instead of closed Risk of bleeding, infection, recurrence, wound palpitations, and the need for further surgery discussed. She's had multiple operations for this disease process so she is well educated.  I told her we  D  with other areas of the time and focus in the flank area since the most symptomatic.  Current Plans The pathophysiology of skin & subcutaneous masses was discussed. Natural history risks without surgery were discussed. I recommended surgery to remove the mass. I explained the technique of removal with use of local anesthesia & possible need for more aggressive sedation/anesthesia for patient comfort.  Risks such as bleeding, infection, wound breakdown, heart attack, death, and other risks were discussed. I noted a good likelihood this will help address the problem. Possibility that this will not correct all symptoms was explained. Possibility of regrowth/recurrence of the mass was discussed. We will work to minimize complications. Questions were answered. The patient expresses understanding & wishes to proceed with surgery.  You are being scheduled for surgery- Our schedulers will call you.  You should hear from our office's scheduling department within 5 working days about the location, date, and time of surgery. We try to make accommodations for patient's preferences in scheduling surgery, but sometimes the OR schedule or the surgeon's schedule prevents [place] from making those accommodations.  If you have not heard from our office [phone]) in 5 working days, call the office and ask for your surgeon's nurse.  If you have other questions about your diagnosis, plan, or surgery, call the office and ask for your surgeon's nurse.  The anatomy and physiology of sweat glands and skin creases was discussed. Pathophysiology of hidradenitis suppurativa was discussed. I stressed good hygiene, avoiding razors and avoiding antiperspirants. Consideration of excision with primary closure versus need to place a drain, open packing, possible skin flaps/grafts was discussed.  Possibility of recurrence was discussed. Risks, benefits, alternatives were discussed. I noted a good likelihood this will help  address the problem. Risks of anesthesia and other risks discussed. Questions answered. The patient is considering surgery. They wish to proceed.  Pt Education - [facility] Free Text Education/Instructions: discussed with patient and provided information.

## 2019-01-22 ENCOUNTER — Other Ambulatory Visit: Payer: Self-pay

## 2019-01-22 ENCOUNTER — Encounter (HOSPITAL_BASED_OUTPATIENT_CLINIC_OR_DEPARTMENT_OTHER): Payer: Self-pay | Admitting: *Deleted

## 2019-01-28 ENCOUNTER — Encounter (HOSPITAL_BASED_OUTPATIENT_CLINIC_OR_DEPARTMENT_OTHER)
Admission: RE | Admit: 2019-01-28 | Discharge: 2019-01-28 | Disposition: A | Discharge status: Home / Self Care | Payer: Medicaid Other | Source: Ambulatory Visit | Attending: Surgery | Admitting: Surgery

## 2019-01-28 DIAGNOSIS — F17200 Nicotine dependence, unspecified, uncomplicated: Secondary | ICD-10-CM | POA: Diagnosis not present

## 2019-01-28 DIAGNOSIS — Z6841 Body Mass Index (BMI) 40.0 and over, adult: Secondary | ICD-10-CM | POA: Diagnosis not present

## 2019-01-28 DIAGNOSIS — Z01812 Encounter for preprocedural laboratory examination: Secondary | ICD-10-CM | POA: Diagnosis present

## 2019-01-28 DIAGNOSIS — L732 Hidradenitis suppurativa: Secondary | ICD-10-CM | POA: Diagnosis present

## 2019-01-28 LAB — CBC WITH DIFFERENTIAL/PLATELET
Abs Immature Granulocytes: 0.03 10*3/uL (ref 0.00–0.07)
Basophils Absolute: 0 10*3/uL (ref 0.0–0.1)
Basophils Relative: 0 %
EOS PCT: 0 %
Eosinophils Absolute: 0 10*3/uL (ref 0.0–0.5)
HCT: 43.1 % (ref 36.0–46.0)
Hemoglobin: 14.1 g/dL (ref 12.0–15.0)
Immature Granulocytes: 0 %
Lymphocytes Relative: 34 %
Lymphs Abs: 2.7 10*3/uL (ref 0.7–4.0)
MCH: 30.7 pg (ref 26.0–34.0)
MCHC: 32.7 g/dL (ref 30.0–36.0)
MCV: 93.9 fL (ref 80.0–100.0)
Monocytes Absolute: 0.6 10*3/uL (ref 0.1–1.0)
Monocytes Relative: 7 %
NRBC: 0 % (ref 0.0–0.2)
Neutro Abs: 4.6 10*3/uL (ref 1.7–7.7)
Neutrophils Relative %: 59 %
PLATELETS: 342 10*3/uL (ref 150–400)
RBC: 4.59 MIL/uL (ref 3.87–5.11)
RDW: 14.1 % (ref 11.5–15.5)
WBC: 8 10*3/uL (ref 4.0–10.5)

## 2019-01-28 LAB — COMPREHENSIVE METABOLIC PANEL
ALT: 13 U/L (ref 0–44)
ANION GAP: 10 (ref 5–15)
AST: 23 U/L (ref 15–41)
Albumin: 4 g/dL (ref 3.5–5.0)
Alkaline Phosphatase: 72 U/L (ref 38–126)
BUN: 6 mg/dL (ref 6–20)
CO2: 24 mmol/L (ref 22–32)
Calcium: 9.3 mg/dL (ref 8.9–10.3)
Chloride: 103 mmol/L (ref 98–111)
Creatinine, Ser: 0.7 mg/dL (ref 0.44–1.00)
GFR calc Af Amer: >60 mL/min (ref >60)
GFR calc non Af Amer: >60 mL/min (ref >60)
Glucose, Bld: 75 mg/dL (ref 70–99)
POTASSIUM: 4.6 mmol/L (ref 3.5–5.1)
Sodium: 137 mmol/L (ref 135–145)
Total Bilirubin: 0.5 mg/dL (ref 0.3–1.2)
Total Protein: 8.3 g/dL — ABNORMAL HIGH (ref 6.5–8.1)

## 2019-01-28 LAB — POCT PREGNANCY, URINE: Preg Test, Ur: NEGATIVE

## 2019-01-28 NOTE — Progress Notes (Signed)
Called patient to touch base on coming in for her labs today.  She advised she would be coming in before 3pm today.

## 2019-01-28 NOTE — Progress Notes (Signed)
Patient had CMET, CBCD and rapid urine pregnancy tests completed. Pregnancy test result is negative. Pre surgery drink also given to patient and she was advised to drink it all before 0600 hrs on the day of surgery.

## 2019-01-29 ENCOUNTER — Encounter (HOSPITAL_BASED_OUTPATIENT_CLINIC_OR_DEPARTMENT_OTHER): Payer: Self-pay

## 2019-01-29 ENCOUNTER — Other Ambulatory Visit: Payer: Self-pay

## 2019-01-29 ENCOUNTER — Encounter (HOSPITAL_BASED_OUTPATIENT_CLINIC_OR_DEPARTMENT_OTHER)
Admission: RE | Disposition: A | Discharge status: Home / Self Care | Payer: Self-pay | Source: Ambulatory Visit | Attending: Surgery

## 2019-01-29 ENCOUNTER — Ambulatory Visit (HOSPITAL_BASED_OUTPATIENT_CLINIC_OR_DEPARTMENT_OTHER): Payer: Medicaid Other | Admitting: Certified Registered"

## 2019-01-29 ENCOUNTER — Ambulatory Visit (HOSPITAL_BASED_OUTPATIENT_CLINIC_OR_DEPARTMENT_OTHER)
Admission: RE | Admit: 2019-01-29 | Discharge: 2019-01-29 | Disposition: A | Discharge status: Home / Self Care | Payer: Medicaid Other | Source: Ambulatory Visit | Attending: Surgery | Admitting: Surgery

## 2019-01-29 DIAGNOSIS — Z6841 Body Mass Index (BMI) 40.0 and over, adult: Secondary | ICD-10-CM | POA: Insufficient documentation

## 2019-01-29 DIAGNOSIS — L089 Local infection of the skin and subcutaneous tissue, unspecified: Secondary | ICD-10-CM | POA: Diagnosis not present

## 2019-01-29 DIAGNOSIS — L732 Hidradenitis suppurativa: Secondary | ICD-10-CM | POA: Diagnosis not present

## 2019-01-29 DIAGNOSIS — F17200 Nicotine dependence, unspecified, uncomplicated: Secondary | ICD-10-CM | POA: Insufficient documentation

## 2019-01-29 DIAGNOSIS — J45909 Unspecified asthma, uncomplicated: Secondary | ICD-10-CM | POA: Diagnosis not present

## 2019-01-29 HISTORY — PX: HYDRADENITIS EXCISION: SHX5243

## 2019-01-29 SURGERY — EXCISION, HIDRADENITIS, INGUINAL REGION
Anesthesia: General | Site: Groin | Laterality: Bilateral

## 2019-01-29 MED ORDER — CLINDAMYCIN PHOSPHATE 900 MG/50ML IV SOLN
900.0000 mg | INTRAVENOUS | Status: AC
  Administered 2019-01-29: 900 mg via INTRAVENOUS

## 2019-01-29 MED ORDER — OXYCODONE HCL 5 MG PO TABS
ORAL_TABLET | ORAL | Status: AC
  Filled 2019-01-29: qty 1

## 2019-01-29 MED ORDER — ACETAMINOPHEN 500 MG PO TABS
ORAL_TABLET | ORAL | Status: AC
  Filled 2019-01-29: qty 2

## 2019-01-29 MED ORDER — PROPOFOL 10 MG/ML IV BOLUS
INTRAVENOUS | Status: DC | PRN
  Administered 2019-01-29: 50 mg via INTRAVENOUS
  Administered 2019-01-29: 200 mg via INTRAVENOUS

## 2019-01-29 MED ORDER — BUPIVACAINE HCL (PF) 0.25 % IJ SOLN
INTRAMUSCULAR | Status: AC
  Filled 2019-01-29: qty 30

## 2019-01-29 MED ORDER — PROPOFOL 500 MG/50ML IV EMUL
INTRAVENOUS | Status: DC | PRN
  Administered 2019-01-29: 25 ug/kg/min via INTRAVENOUS

## 2019-01-29 MED ORDER — FENTANYL CITRATE (PF) 100 MCG/2ML IJ SOLN
INTRAMUSCULAR | Status: AC
  Filled 2019-01-29: qty 2

## 2019-01-29 MED ORDER — LIDOCAINE HCL (CARDIAC) PF 100 MG/5ML IV SOSY
PREFILLED_SYRINGE | INTRAVENOUS | Status: DC | PRN
  Administered 2019-01-29: 60 mg via INTRAVENOUS

## 2019-01-29 MED ORDER — DEXAMETHASONE SODIUM PHOSPHATE 4 MG/ML IJ SOLN
INTRAMUSCULAR | Status: DC | PRN
  Administered 2019-01-29: 10 mg via INTRAVENOUS

## 2019-01-29 MED ORDER — ACETAMINOPHEN 500 MG PO TABS
1000.0000 mg | ORAL_TABLET | ORAL | Status: AC
  Administered 2019-01-29: 1000 mg via ORAL

## 2019-01-29 MED ORDER — SCOPOLAMINE 1 MG/3DAYS TD PT72: 1.0000 | MEDICATED_PATCH | Freq: Once | TRANSDERMAL | Status: DC | PRN

## 2019-01-29 MED ORDER — BUPIVACAINE HCL (PF) 0.25 % IJ SOLN
INTRAMUSCULAR | Status: DC | PRN
  Administered 2019-01-29: 60 mL

## 2019-01-29 MED ORDER — PROMETHAZINE HCL 25 MG/ML IJ SOLN: 6.2500 mg | INTRAMUSCULAR | Status: DC | PRN

## 2019-01-29 MED ORDER — SUCCINYLCHOLINE CHLORIDE 20 MG/ML IJ SOLN
INTRAMUSCULAR | Status: DC | PRN
  Administered 2019-01-29: 20 mg via INTRAVENOUS

## 2019-01-29 MED ORDER — MIDAZOLAM HCL 2 MG/2ML IJ SOLN
1.0000 mg | INTRAMUSCULAR | Status: DC | PRN
  Administered 2019-01-29: 2 mg via INTRAVENOUS

## 2019-01-29 MED ORDER — IBUPROFEN 800 MG PO TABS: 800.0000 mg | ORAL_TABLET | Freq: Three times a day (TID) | ORAL | 0 refills | Status: DC | PRN

## 2019-01-29 MED ORDER — PHENYLEPHRINE HCL 10 MG/ML IJ SOLN
INTRAMUSCULAR | Status: DC | PRN
  Administered 2019-01-29 (×2): 40 ug via INTRAVENOUS

## 2019-01-29 MED ORDER — OXYCODONE HCL 5 MG PO TABS: 5.0000 mg | ORAL_TABLET | Freq: Four times a day (QID) | ORAL | 0 refills | Status: DC | PRN

## 2019-01-29 MED ORDER — OXYCODONE HCL 5 MG PO TABS
5.0000 mg | ORAL_TABLET | Freq: Once | ORAL | Status: AC
  Administered 2019-01-29: 5 mg via ORAL

## 2019-01-29 MED ORDER — LACTATED RINGERS IV SOLN
INTRAVENOUS | Status: DC
  Administered 2019-01-29: 09:00:00 via INTRAVENOUS

## 2019-01-29 MED ORDER — GABAPENTIN 300 MG PO CAPS
ORAL_CAPSULE | ORAL | Status: AC
  Filled 2019-01-29: qty 1

## 2019-01-29 MED ORDER — FENTANYL CITRATE (PF) 100 MCG/2ML IJ SOLN
50.0000 ug | INTRAMUSCULAR | Status: AC | PRN
  Administered 2019-01-29: 25 ug via INTRAVENOUS
  Administered 2019-01-29: 100 ug via INTRAVENOUS
  Administered 2019-01-29 (×2): 50 ug via INTRAVENOUS

## 2019-01-29 MED ORDER — FENTANYL CITRATE (PF) 100 MCG/2ML IJ SOLN
25.0000 ug | INTRAMUSCULAR | Status: DC | PRN
  Administered 2019-01-29 (×2): 50 ug via INTRAVENOUS

## 2019-01-29 MED ORDER — CHLORHEXIDINE GLUCONATE CLOTH 2 % EX PADS: 6.0000 | MEDICATED_PAD | Freq: Once | CUTANEOUS | Status: DC

## 2019-01-29 MED ORDER — EPINEPHRINE 30 MG/30ML IJ SOLN
INTRAMUSCULAR | Status: AC
  Filled 2019-01-29: qty 1

## 2019-01-29 MED ORDER — ONDANSETRON HCL 4 MG/2ML IJ SOLN
INTRAMUSCULAR | Status: DC | PRN
  Administered 2019-01-29: 4 mg via INTRAVENOUS

## 2019-01-29 MED ORDER — CLINDAMYCIN PHOSPHATE 900 MG/50ML IV SOLN
INTRAVENOUS | Status: AC
  Filled 2019-01-29: qty 50

## 2019-01-29 MED ORDER — GABAPENTIN 300 MG PO CAPS
300.0000 mg | ORAL_CAPSULE | ORAL | Status: AC
  Administered 2019-01-29: 300 mg via ORAL

## 2019-01-29 SURGICAL SUPPLY — 44 items
BINDER ABDOMINAL 12 SM 30-45 (SOFTGOODS) ×2 IMPLANT
BLADE SURG 15 STRL LF DISP TIS (BLADE) ×2 IMPLANT
BLADE SURG 15 STRL SS (BLADE) ×2
BNDG GAUZE ELAST 4 BULKY (GAUZE/BANDAGES/DRESSINGS) ×4 IMPLANT
CANISTER SUCT 1200ML W/VALVE (MISCELLANEOUS) ×4 IMPLANT
CHLORAPREP W/TINT 26ML (MISCELLANEOUS) ×2 IMPLANT
COVER BACK TABLE 60X90IN (DRAPES) ×4 IMPLANT
COVER MAYO STAND STRL (DRAPES) ×4 IMPLANT
COVER WAND RF STERILE (DRAPES) IMPLANT
DECANTER SPIKE VIAL GLASS SM (MISCELLANEOUS) ×4 IMPLANT
DERMABOND ADVANCED (GAUZE/BANDAGES/DRESSINGS)
DERMABOND ADVANCED .7 DNX12 (GAUZE/BANDAGES/DRESSINGS) IMPLANT
DRAPE LAPAROSCOPIC ABDOMINAL (DRAPES) IMPLANT
DRAPE LAPAROTOMY 100X72 PEDS (DRAPES) ×4 IMPLANT
DRAPE UTILITY XL STRL (DRAPES) ×4 IMPLANT
DRSG PAD ABDOMINAL 8X10 ST (GAUZE/BANDAGES/DRESSINGS) ×6 IMPLANT
ELECT COATED BLADE 2.86 ST (ELECTRODE) ×4 IMPLANT
ELECT REM PT RETURN 9FT ADLT (ELECTROSURGICAL) ×4
ELECTRODE REM PT RTRN 9FT ADLT (ELECTROSURGICAL) ×2 IMPLANT
GLOVE BIOGEL PI IND STRL 7.0 (GLOVE) IMPLANT
GLOVE BIOGEL PI IND STRL 8 (GLOVE) ×2 IMPLANT
GLOVE BIOGEL PI INDICATOR 7.0 (GLOVE) ×4
GLOVE BIOGEL PI INDICATOR 8 (GLOVE) ×2
GLOVE ECLIPSE 6.5 STRL STRAW (GLOVE) ×2 IMPLANT
GLOVE ECLIPSE 8.0 STRL XLNG CF (GLOVE) ×4 IMPLANT
GLOVE SURG SS PI 7.0 STRL IVOR (GLOVE) ×2 IMPLANT
GOWN STRL REUS W/ TWL LRG LVL3 (GOWN DISPOSABLE) ×4 IMPLANT
GOWN STRL REUS W/TWL LRG LVL3 (GOWN DISPOSABLE) ×6
NDL HYPO 25X1 1.5 SAFETY (NEEDLE) ×2 IMPLANT
NEEDLE HYPO 25X1 1.5 SAFETY (NEEDLE) ×4 IMPLANT
NS IRRIG 1000ML POUR BTL (IV SOLUTION) ×4 IMPLANT
PACK BASIN DAY SURGERY FS (CUSTOM PROCEDURE TRAY) ×4 IMPLANT
PENCIL BUTTON HOLSTER BLD 10FT (ELECTRODE) ×4 IMPLANT
SLEEVE SCD COMPRESS KNEE MED (MISCELLANEOUS) ×4 IMPLANT
SPONGE LAP 18X18 RF (DISPOSABLE) ×2 IMPLANT
SPONGE LAP 4X18 RFD (DISPOSABLE) ×2 IMPLANT
STAPLER VISISTAT 35W (STAPLE) IMPLANT
SUT MON AB 4-0 PC3 18 (SUTURE) ×2 IMPLANT
SUT VICRYL 3-0 CR8 SH (SUTURE) ×2 IMPLANT
SYR CONTROL 10ML LL (SYRINGE) ×4 IMPLANT
TOWEL GREEN STERILE FF (TOWEL DISPOSABLE) ×12 IMPLANT
TUBE CONNECTING 20'X1/4 (TUBING) ×2
TUBE CONNECTING 20X1/4 (TUBING) ×4 IMPLANT
YANKAUER SUCT BULB TIP NO VENT (SUCTIONS) ×6 IMPLANT

## 2019-01-29 NOTE — Anesthesia Procedure Notes (Signed)
Procedure Name: LMA Insertion Date/Time: 01/29/2019 9:08 AM Performed by: Sheryn Bison, CRNA Pre-anesthesia Checklist: Patient identified, Emergency Drugs available, Suction available and Patient being monitored Patient Re-evaluated:Patient Re-evaluated prior to induction Oxygen Delivery Method: Circle system utilized Preoxygenation: Pre-oxygenation with 100% oxygen Induction Type: IV induction Ventilation: Mask ventilation without difficulty LMA: LMA inserted LMA Size: 4.0 Number of attempts: 1 Airway Equipment and Method: Bite block Placement Confirmation: positive ETCO2 Tube secured with: Tape Dental Injury: Teeth and Oropharynx as per pre-operative assessment

## 2019-01-29 NOTE — Transfer of Care (Signed)
Immediate Anesthesia Transfer of Care Note  Patient: Loretta Moore  Procedure(s) Performed: Excision Hidradentitis groin (right) and right and left flank (Bilateral Groin)  Patient Location: PACU  Anesthesia Type:General  Level of Consciousness: awake, alert , oriented and patient cooperative  Airway & Oxygen Therapy: Patient Spontanous Breathing and Patient connected to face mask oxygen  Post-op Assessment: Report given to RN and Post -op Vital signs reviewed and stable  Post vital signs: Reviewed and stable  Last Vitals:  Vitals Value Taken Time  BP 123/98 01/29/2019 10:45 AM  Temp    Pulse 89 01/29/2019 10:51 AM  Resp 17 01/29/2019 10:51 AM  SpO2 98 % 01/29/2019 10:51 AM  Vitals shown include unvalidated device data.  Last Pain:  Vitals:   01/29/19 0850  TempSrc: Oral  PainSc: 0-No pain         Complications: No apparent anesthesia complications

## 2019-01-29 NOTE — Discharge Instructions (Signed)
Wound Packing Wound packing usually involves placing a moistened packing material into your wound and then covering it with an outer bandage (dressing). This helps promote proper healing of deep tissue and tissue under the skin. It also helps prevent bleeding, infection, and further injury. Wounds are packed until deep tissues heal. The time it takes for this to occur is different for everyone. Your health care provider will show you how to pack and dress your wound. Using gloves and a clean technique is important in order to avoid spreading germs into your wound. Supplies needed:  Soap and water.  Disposable gloves.  Wetting solution.  Clean bowl.  Clean packing material (gauze or gauze sponges).  Clean paper towels.  Outer dressing.  Tape.  Cotton-tipped swabs.  Small plastic bag. How to pack your wound Follow your health care provider's instructions on how often you need to change dressings and pack your wound. You will likely be asked to change dressings 1-2 times a day. Preparing the new packing material  1. Clean and disinfect your work surface or countertop. 2. Set a plastic bag on or near your work surface. 3. Wash your hands well with soap and water. 4. Put a clean paper towel on the counter. 5. Put a clean bowl on the towel. Be sure to only touch the outside of the bowl when handling it. 6. Pour wetting solution into the bowl. 7. Cut your packing material (gauze or sponges) to the right size for your wound. Drop it into the bowl. 8. Cut 4 tape strips that you will use to seal the outer dressing. 9. Put cotton-tipped swabs on the clean paper towel. Removing the old packing material and dressing 1. Put on a set of gloves. 2. Gently remove the old dressing and packing material. 3. Remove your gloves. 4. Put the removed items, including the gloves, into the plastic bag to throw away later. 5. Wash your hands well with soap and water again. Applying the new packing  material and dressing 1. Put on a new set of gloves. 2. Squeeze the packing material in the bowl to release the extra liquid. The packing material should be moist, but not dripping wet. 3. Gently place the packing material into the wound. Use a cotton-tipped swab to guide it into place, filling all of the space. 4. Dry your gloved fingertips on the paper towel. 5. Open up your outer dressing supplies and put them on a dry part of the paper towel. Keep them from getting wet. 6. Place the outer dressing over the packed wound. 7. Tape the 4 outer edges of the outer dressing in place. 8. Remove your gloves. 9. Wash your hands again with soap and water. 10. Clean and disinfect your work surface or countertop. General tips  Follow your health care provider's instructions on how much to pack the wound. At first, you may need to pack it more tightly to help stop bleeding. As the wound begins to heal inside, you will use less packing material and pack the wound loosely to allow tissue to heal slowly from the inside out.  Keep the dressing clean and dry.  Follow any other instructions given by your health care provider on how to aid healing. This may include applying warm or cold compresses, raising (elevating) the affected area, or wearing a compression dressing.  Check your wound site every day for signs of infection. Check for: ? More redness, swelling, or pain. ? More fluid or blood. ? Warmth. ?  Pus or a bad smell.  Ask your health care provider about avoiding sun exposure and using sunscreen when the dressings are no longer needed.  Keep all follow-up visits as told by your health care provider. This is important. Contact a health care provider if:  You have more drainage, redness, swelling, or pain at your wound site.  You notice a bad smell coming from the wound site.  Your wound site feels warm to the touch.  Your wound becomes larger or deeper.  Your wound changes in size or  depth. Get help right away if:  Your pain is not controlled with pain medicine.  The tissue inside your wound changes color from pink to white, yellow, or black.  You have a fever.  You have shaking chills.  You are having trouble packing your wound. Summary  Wound packing usually involves placing a moistened packing material into your wound and then covering it with an outer bandage (dressing).  Follow your health care provider's instructions on how often you need to change dressings and pack your wound. You will likely be asked to change dressings 1-2 times a day.  When packing your wound, it is important to use gloves and a clean technique in order to avoid spreading germs into the wound.  Check your wound site every day for signs of infection. This information is not intended to replace advice given to you by your health care provider. Make sure you discuss any questions you have with your health care provider. Document Released: 06/18/2014 Document Revised: 12/28/2017 Document Reviewed: 12/28/2017 Elsevier Interactive Patient Education  2019 [employer].   Post Anesthesia Home Care Instructions  Activity: Get plenty of rest for the remainder of the day. A responsible individual must stay with you for 24 hours following the procedure.  For the next 24 hours, DO NOT: -Drive a car -Advertising copywriter -Drink alcoholic beverages -Take any medication unless instructed by your physician -Make any legal decisions or sign important papers.  Meals: Start with liquid foods such as gelatin or soup. Progress to regular foods as tolerated. Avoid greasy, spicy, heavy foods. If nausea and/or vomiting occur, drink only clear liquids until the nausea and/or vomiting subsides. Call your physician if vomiting continues.  Special Instructions/Symptoms: Your throat may feel dry or sore from the anesthesia or the breathing tube placed in your throat during surgery. If this causes discomfort,  gargle with warm salt water. The discomfort should disappear within 24 hours.  If you had a scopolamine patch placed behind your ear for the management of post- operative nausea and/or vomiting:  1. The medication in the patch is effective for 72 hours, after which it should be removed.  Wrap patch in a tissue and discard in the trash. Wash hands thoroughly with soap and water. 2. You may remove the patch earlier than 72 hours if you experience unpleasant side effects which may include dry mouth, dizziness or visual disturbances. 3. Avoid touching the patch. Wash your hands with soap and water after contact with the patch.

## 2019-01-29 NOTE — Anesthesia Preprocedure Evaluation (Addendum)
Anesthesia Evaluation  Patient identified by MRN, date of birth, ID band Patient awake  General Assessment Comment:HIDRADENITIS  Reviewed: Allergy & Precautions, NPO status , Patient's Chart, lab work & pertinent test results  Airway Mallampati: III  TM Distance: >3 FB Neck ROM: Full    Dental  (+) Teeth Intact, Dental Advisory Given   Pulmonary asthma , Current Smoker,    Pulmonary exam normal breath sounds clear to auscultation       Cardiovascular negative cardio ROS Normal cardiovascular exam Rhythm:Regular Rate:Normal     Neuro/Psych negative neurological ROS     GI/Hepatic negative GI ROS, Neg liver ROS,   Endo/Other  Morbid obesity  Renal/GU negative Renal ROS     Musculoskeletal negative musculoskeletal ROS (+)   Abdominal   Peds  Hematology negative hematology ROS (+)   Anesthesia Other Findings Day of surgery medications reviewed with the patient.  Reproductive/Obstetrics                            Anesthesia Physical Anesthesia Plan  ASA: III  Anesthesia Plan: General   Post-op Pain Management:    Induction: Intravenous  PONV Risk Score and Plan: 3 and Midazolam, Dexamethasone and Ondansetron  Airway Management Planned: Oral ETT  Additional Equipment:   Intra-op Plan:   Post-operative Plan: Extubation in OR  Informed Consent: I have reviewed the patients History and Physical, chart, labs and discussed the procedure including the risks, benefits and alternatives for the proposed anesthesia with the patient or authorized representative who has indicated his/her understanding and acceptance.     Dental advisory given  Plan Discussed with: CRNA  Anesthesia Plan Comments:         Anesthesia Quick Evaluation

## 2019-01-29 NOTE — Op Note (Signed)
Preoperative diagnosis: Bilateral flank hidradenitis suppurativa with extension into the right groin  Postoperative diagnosis: Same  Procedure: Excision of bilateral flank hidradenitis suppurativa and right groin hidradenitis  Surgeon: Erroll Luna, MD  Anesthesia: LMA with 0.25% Sensorcaine local plain  EBL: 60 cc  Specimen: Areas of hidradenitis sent to pathology  IV fluids: Per anesthesia record  Indications for procedure: The patient is a 23 year old female whose had a longstanding history of hidradenitis.  She is areas along both flank areas that extended to the right groin on the right side she desires excision due to chronic drainage, pain and repeated bouts of antibiotics and local drainage procedures that have been unsuccessful.  Risk, benefits and other treatment options were discussed with the patient as well as long-term wound care.  The patient did not want her wounds closed therefore she desired packing and this was discussed and she is quite familiar with this.The procedure has been discussed with the patient.  Alternative therapies have been discussed with the patient.  Operative risks include bleeding,  Infection,  Organ injury,  Nerve injury,  Blood vessel injury,  DVT,  Pulmonary embolism,  Death,  And possible reoperation.  Medical management risks include worsening of present situation.  The success of the procedure is 50 -90 % at treating patients symptoms.  The patient understands and agrees to proceed.  Description of procedure: The patient was met in the holding area.  Both flanks were marked in an area that extended down into her right groin was marked as well since this was extension from the right flank.  These are all inflamed the draining sinus tracts.  She desired excision.  The procedure was reviewed as well as postoperative care and pain management issues.  These were clearly discussed with the patient.  She had no further questions.  She was then taken back the  operating.  She is placed initially supine.  The right groin region was prepped and draped in sterile fashion timeout was done.  We verified all the areas which were marked by myself preoperatively for excision.  This area was excised and this measured roughly 15 cm x 8 cm.  This was skin and sinus tracts that were removed.  The wound was made hemostatic and irrigated and local anesthetic was infiltrated.  It was then packed with saline soaked gauze.  The patient was then rolled up onto her left side to expose more of the right flank.  This was prepped and draped in a separate separate fashion.  In a similar fashion in this area which measured 15 cm x 3 cm was excised to remove all the hidradenitis.  There was an abscess noted in the subtenon space and this was washed out.  This was made hemostatic and packed with saline soaked Kerlix.  The patient was then rolled up onto her right side to expose the left flank hidradenitis.  This measured 15 x 3 cm.  This was excised using cautery and local anesthetic was infiltrated for pain control.  Hemostasis achieved.  This was packed with saline soaked Kerlix.  Dry dressings were applied abdominal binder was placed to hold dressings in place.  All final counts were found to be correct and there was excellent hemostasis in all 3 wounds.  Counts were found to be correct.  The patient was then extubated taken to recovery in satisfactory condition.

## 2019-01-29 NOTE — Anesthesia Postprocedure Evaluation (Signed)
Anesthesia Post Note  Patient: Education officer, environmental  Procedure(s) Performed: Excision Hidradentitis groin (right) and right and left flank (Bilateral Groin)     Patient location during evaluation: PACU Anesthesia Type: General Level of consciousness: awake and alert, awake and oriented Pain management: pain level controlled Vital Signs Assessment: post-procedure vital signs reviewed and stable Respiratory status: spontaneous breathing, nonlabored ventilation and respiratory function stable Cardiovascular status: blood pressure returned to baseline and stable Postop Assessment: no apparent nausea or vomiting Anesthetic complications: yes Anesthetic complication details: respiratory event and anesthesia complicationsComments: Laryngospasm post LMA removal requiring two handed mask ventilation/jaw thrust, 50mg  propofol, and 20mg  succinylcholine to break.  Desaturation into 60s on SpO2 monitor recovered once patient paralyzed. Able to avoid re-intubation.  Patient woke up and had uneventful PACU recovery.  Able to wean to room air.    Last Vitals:  Vitals:   01/29/19 1115 01/29/19 1205  BP: 125/74   Pulse: 88   Resp: 10   Temp:  36.7 C  SpO2: 99%     Last Pain:  Vitals:   01/29/19 1115  TempSrc:   PainSc: 7                  Cecile Hearing

## 2019-01-29 NOTE — Interval H&P Note (Signed)
History and Physical Interval Note:  01/29/2019 9:02 AM  Loretta Moore  has presented today for surgery, with the diagnosis of HIDRADENITIS  The various methods of treatment have been discussed with the patient and family. After consideration of risks, benefits and other options for treatment, the patient has consented to  Procedure(s): EXCISION BILATERAL FLANK HIDRADENITIS (Bilateral) as a surgical intervention .  The patient's history has been reviewed, patient examined, no change in status, stable for surgery.  I have reviewed the patient's chart and labs.  Questions were answered to the patient's satisfaction.     Aryan Bello A Anhar Mcdermott

## 2019-01-29 NOTE — Addendum Note (Signed)
Addendum  created 01/29/19 1339 by Welda Azzarello, Jewel Baize, CRNA   Intraprocedure Meds edited

## 2019-01-30 ENCOUNTER — Encounter (HOSPITAL_BASED_OUTPATIENT_CLINIC_OR_DEPARTMENT_OTHER): Payer: Self-pay | Admitting: Surgery

## 2019-02-27 ENCOUNTER — Telehealth: Payer: Self-pay | Admitting: *Deleted

## 2019-02-27 DIAGNOSIS — O3680X0 Pregnancy with inconclusive fetal viability, not applicable or unspecified: Secondary | ICD-10-CM

## 2019-02-27 MED ORDER — PRENATAL PLUS 27-1 MG PO TABS: 1.0000 | ORAL_TABLET | Freq: Every day | ORAL | 12 refills | Status: DC

## 2019-02-27 NOTE — Telephone Encounter (Signed)
LMP was 01/25/19,had +HPT,  so 4 +[redacted] weeks pregnant EDD 11/02/19, has headaches, morning nausea .wil rx prenatal plus, eat often, and OK to take tylenol, will make dating [place] in about 3 weeks

## 2019-02-27 NOTE — Telephone Encounter (Signed)
Patient called stating she needs to schedule an appointment for a pregnancy test patient states she took a test at home and it was positive. Please advise [phone]

## 2019-03-20 ENCOUNTER — Other Ambulatory Visit: Payer: Medicaid Other

## 2019-03-26 ENCOUNTER — Other Ambulatory Visit: Payer: Self-pay

## 2019-03-26 ENCOUNTER — Ambulatory Visit (INDEPENDENT_AMBULATORY_CARE_PROVIDER_SITE_OTHER): Payer: Medicaid Other

## 2019-03-26 DIAGNOSIS — Z3A08 8 weeks gestation of pregnancy: Secondary | ICD-10-CM

## 2019-03-26 DIAGNOSIS — O3680X0 Pregnancy with inconclusive fetal viability, not applicable or unspecified: Secondary | ICD-10-CM

## 2019-03-26 NOTE — Progress Notes (Signed)
[place] 8+4 wks,single IUP w/ys,positive fht 171 bpm,crl 24.82 mm,normal ovaries bilat

## 2019-04-15 ENCOUNTER — Emergency Department (HOSPITAL_COMMUNITY)
Admission: EM | Admit: 2019-04-15 | Discharge: 2019-04-15 | Disposition: A | Discharge status: Home / Self Care | Payer: Medicaid Other | Attending: Emergency Medicine | Admitting: Emergency Medicine

## 2019-04-15 ENCOUNTER — Other Ambulatory Visit: Payer: Self-pay

## 2019-04-15 ENCOUNTER — Encounter (HOSPITAL_COMMUNITY): Payer: Self-pay

## 2019-04-15 DIAGNOSIS — Z79899 Other long term (current) drug therapy: Secondary | ICD-10-CM | POA: Diagnosis not present

## 2019-04-15 DIAGNOSIS — R51 Headache: Secondary | ICD-10-CM | POA: Diagnosis not present

## 2019-04-15 DIAGNOSIS — O9989 Other specified diseases and conditions complicating pregnancy, childbirth and the puerperium: Secondary | ICD-10-CM | POA: Diagnosis not present

## 2019-04-15 DIAGNOSIS — Z3A11 11 weeks gestation of pregnancy: Secondary | ICD-10-CM | POA: Insufficient documentation

## 2019-04-15 DIAGNOSIS — J45909 Unspecified asthma, uncomplicated: Secondary | ICD-10-CM | POA: Diagnosis not present

## 2019-04-15 DIAGNOSIS — F17210 Nicotine dependence, cigarettes, uncomplicated: Secondary | ICD-10-CM | POA: Diagnosis not present

## 2019-04-15 DIAGNOSIS — R519 Headache, unspecified: Secondary | ICD-10-CM

## 2019-04-15 MED ORDER — ACETAMINOPHEN-CODEINE #3 300-30 MG PO TABS
1.0000 | ORAL_TABLET | Freq: Once | ORAL | Status: AC
  Administered 2019-04-15: 1 via ORAL
  Filled 2019-04-15: qty 1

## 2019-04-15 NOTE — Discharge Instructions (Addendum)
The medication given here may cause drowsiness.  Continue to drink plenty of water.  Follow-up with your OB provider if needed.  Return here for any worsening symptoms
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
## 2019-04-25 ENCOUNTER — Other Ambulatory Visit: Payer: Self-pay | Admitting: Obstetrics & Gynecology

## 2019-04-25 DIAGNOSIS — Z3682 Encounter for antenatal screening for nuchal translucency: Secondary | ICD-10-CM

## 2019-04-26 ENCOUNTER — Telehealth: Payer: Self-pay | Admitting: Women's Health

## 2019-04-26 NOTE — Telephone Encounter (Signed)
LMOM for pt with restrictions and to bring a mask

## 2019-04-30 ENCOUNTER — Ambulatory Visit (INDEPENDENT_AMBULATORY_CARE_PROVIDER_SITE_OTHER): Payer: Medicaid Other | Admitting: Women's Health

## 2019-04-30 ENCOUNTER — Other Ambulatory Visit (HOSPITAL_COMMUNITY)
Admission: RE | Admit: 2019-04-30 | Discharge: 2019-04-30 | Disposition: A | Discharge status: Home / Self Care | Payer: Medicaid Other | Source: Ambulatory Visit | Attending: Obstetrics & Gynecology | Admitting: Obstetrics & Gynecology

## 2019-04-30 ENCOUNTER — Other Ambulatory Visit: Payer: Self-pay

## 2019-04-30 ENCOUNTER — Ambulatory Visit (INDEPENDENT_AMBULATORY_CARE_PROVIDER_SITE_OTHER): Payer: Medicaid Other

## 2019-04-30 ENCOUNTER — Encounter: Payer: Self-pay | Admitting: Women's Health

## 2019-04-30 ENCOUNTER — Ambulatory Visit: Payer: Medicaid Other | Admitting: *Deleted

## 2019-04-30 VITALS — BP 107/59 | HR 79

## 2019-04-30 DIAGNOSIS — Z3481 Encounter for supervision of other normal pregnancy, first trimester: Secondary | ICD-10-CM | POA: Insufficient documentation

## 2019-04-30 DIAGNOSIS — F17200 Nicotine dependence, unspecified, uncomplicated: Secondary | ICD-10-CM

## 2019-04-30 DIAGNOSIS — Z124 Encounter for screening for malignant neoplasm of cervix: Secondary | ICD-10-CM | POA: Insufficient documentation

## 2019-04-30 DIAGNOSIS — L732 Hidradenitis suppurativa: Secondary | ICD-10-CM

## 2019-04-30 DIAGNOSIS — Z331 Pregnant state, incidental: Secondary | ICD-10-CM

## 2019-04-30 DIAGNOSIS — Z3A13 13 weeks gestation of pregnancy: Secondary | ICD-10-CM | POA: Insufficient documentation

## 2019-04-30 DIAGNOSIS — Z3682 Encounter for antenatal screening for nuchal translucency: Secondary | ICD-10-CM

## 2019-04-30 DIAGNOSIS — O99331 Smoking (tobacco) complicating pregnancy, first trimester: Secondary | ICD-10-CM

## 2019-04-30 DIAGNOSIS — O34211 Maternal care for low transverse scar from previous cesarean delivery: Secondary | ICD-10-CM

## 2019-04-30 DIAGNOSIS — Z1371 Encounter for nonprocreative screening for genetic disease carrier status: Secondary | ICD-10-CM | POA: Diagnosis present

## 2019-04-30 DIAGNOSIS — Z363 Encounter for antenatal screening for malformations: Secondary | ICD-10-CM

## 2019-04-30 DIAGNOSIS — Z1389 Encounter for screening for other disorder: Secondary | ICD-10-CM

## 2019-04-30 DIAGNOSIS — Z98891 History of uterine scar from previous surgery: Secondary | ICD-10-CM

## 2019-04-30 DIAGNOSIS — Z3490 Encounter for supervision of normal pregnancy, unspecified, unspecified trimester: Secondary | ICD-10-CM | POA: Insufficient documentation

## 2019-04-30 DIAGNOSIS — Z1379 Encounter for other screening for genetic and chromosomal anomalies: Secondary | ICD-10-CM

## 2019-04-30 LAB — POCT URINALYSIS DIPSTICK OB
Blood, UA: NEGATIVE
Glucose, UA: NEGATIVE
Ketones, UA: NEGATIVE
Leukocytes, UA: NEGATIVE
Nitrite, UA: NEGATIVE
POC,PROTEIN,UA: NEGATIVE

## 2019-04-30 MED ORDER — BLOOD PRESSURE MONITOR MISC: 0 refills | Status: DC

## 2019-04-30 MED ORDER — CLINDAMYCIN HCL 300 MG PO CAPS: 300.0000 mg | ORAL_CAPSULE | Freq: Three times a day (TID) | ORAL | 0 refills | Status: DC

## 2019-04-30 NOTE — Patient Instructions (Signed)
Ellender Hose, I greatly value your feedback.  If you receive a survey following your visit with [place] today, we appreciate you taking the time to fill it out.  Thanks, Joellyn Haff, CNM, [facility]  [facility] HAS MOVED!!! It is now [facility] at [facility] ([address] [place], [place] [address]) Entrance located off of [address] Free 24/7 valet parking   Home Blood Pressure Monitoring for Patients   Your provider has recommended that you check your blood pressure (BP) at least once a week at home. If you do not have a blood pressure cuff at home, one will be provided for you. Contact your provider if you have not received your monitor within 1 week.   Helpful Tips for Accurate Home Blood Pressure Checks  . Don't smoke, exercise, or drink caffeine 30 minutes before checking your BP . Use the restroom before checking your BP (a full bladder can raise your pressure) . Relax in a comfortable upright chair . Feet on the ground . Left arm resting comfortably on a flat surface at the level of your heart . Legs uncrossed . Back supported . Sit quietly and don't talk . Place the cuff on your bare arm . Adjust snuggly, so that only two fingertips can fit between your skin and the top of the cuff . Check 2 readings separated by at least one minute . Keep a log of your BP readings . For a visual, please reference this diagram: http://ccnc.care/bpdiagram  Provider Name: [facility]     Phone: [phone]  Zone 1: ALL CLEAR  Continue to monitor your symptoms:  . BP reading is less than 140 (top number) or less than 90 (bottom number)  . No right upper stomach pain . No headaches or seeing spots . No feeling nauseated or throwing up . No swelling in face and hands  Zone 2: CAUTION Call your doctor's office for any of the following:  . BP reading is greater than 140 (top number) or greater than 90 (bottom number)  . Stomach pain under your ribs in the middle  or right side . Headaches or seeing spots . Feeling nauseated or throwing up . Swelling in face and hands  Zone 3: EMERGENCY  Seek immediate medical care if you have any of the following:  . BP reading is greater than160 (top number) or greater than 110 (bottom number) . Severe headaches not improving with Tylenol . Serious difficulty catching your breath . Any worsening symptoms from Zone 2     Nausea & Vomiting  Have saltine crackers or pretzels by your bed and eat a few bites before you raise your head out of bed in the morning  Eat small frequent meals throughout the day instead of large meals  Drink plenty of fluids throughout the day to stay hydrated, just don't drink a lot of fluids with your meals.  This can make your stomach fill up faster making you feel sick  Do not brush your teeth right after you eat  Products with real ginger are good for nausea, like ginger ale and ginger hard candy Make sure it says made with real ginger!  Sucking on sour candy like lemon heads is also good for nausea  If your prenatal vitamins make you nauseated, take them at night so you will sleep through the nausea  Sea Bands  If you feel like you need medicine for the nausea & vomiting please let us know  If you are unable to keep any fluids or food down please let us know   Constipation  Drink plenty of fluid, preferably water, throughout the day  Eat foods high in fiber such as fruits, vegetables, and grains  Exercise, such as walking, is a good way to keep your bowels regular  Drink warm fluids, especially warm prune juice, or decaf coffee  Eat a 1/2 cup of real oatmeal (not instant), 1/2 cup applesauce, and 1/2-1 cup warm prune juice every day  If needed, you may take Colace (docusate sodium) stool softener once or twice a day to help keep the stool soft. If you are pregnant, wait until you are out of your first trimester (12-14 weeks of pregnancy)  If you still are having  problems with constipation, you may take Miralax once daily as needed to help keep your bowels regular.  If you are pregnant, wait until you are out of your first trimester (12-14 weeks of pregnancy)   First Trimester of Pregnancy The first trimester of pregnancy is from week 1 until the end of week 12 (months 1 through 3). A week after a sperm fertilizes an egg, the egg will implant on the wall of the uterus. This embryo will begin to develop into a baby. Genes from you and your partner are forming the baby. The female genes determine whether the baby is a boy or a girl. At 6-8 weeks, the eyes and face are formed, and the heartbeat can be seen on ultrasound. At the end of 12 weeks, all the baby's organs are formed.  Now that you are pregnant, you will want to do everything you can to have a healthy baby. Two of the most important things are to get good prenatal care and to follow your health care provider's instructions. Prenatal care is all the medical care you receive before the baby's birth. This care will help prevent, find, and treat any problems during the pregnancy and childbirth. BODY CHANGES Your body goes through many changes during pregnancy. The changes vary from woman to woman.   You may gain or lose a couple of pounds at first.  You may feel sick to your stomach (nauseous) and throw up (vomit). If the vomiting is uncontrollable, call your health care provider.  You may tire easily.  You may develop headaches that can be relieved by medicines approved by your health care provider.  You may urinate more often. Painful urination may mean you have a bladder infection.  You may develop heartburn as a result of your pregnancy.  You may develop constipation because certain hormones are causing the muscles that push waste through your intestines to slow down.  You may develop hemorrhoids or swollen, bulging veins (varicose veins).  Your breasts may begin to grow larger and become tender.  Your nipples may stick out more, and the tissue that surrounds them (areola) may become darker.  Your gums may bleed and may be sensitive to brushing and flossing.  Dark spots or blotches (chloasma, mask of pregnancy) may develop on your face. This will likely fade after the baby is born.  Your menstrual periods will stop.  You may have a loss of appetite.  You may develop cravings for certain kinds of food.  You may have changes in your emotions from day to day, such as being excited to be pregnant or being concerned that something may go wrong with the pregnancy and baby.  You may have more vivid and strange dreams.  You may have changes in your hair. These can include thickening of your hair, rapid growth, and changes in texture. Some women also have hair loss during or after pregnancy, or hair that feels dry or thin. Your hair will most likely return to normal after your baby is born. WHAT TO EXPECT AT YOUR PRENATAL VISITS During a routine prenatal visit:  You will be weighed to make sure you and the baby are growing normally.  Your blood pressure will be taken.  Your abdomen will be measured to track your baby's growth.  The fetal heartbeat will be listened to starting around week 10 or 12 of your pregnancy.  Test results from any previous visits will be discussed. Your health care provider may ask you:  How you are feeling.  If you are feeling the baby move.  If you have had any abnormal symptoms, such as leaking fluid, bleeding, severe headaches, or abdominal cramping.  If you have any questions. Other tests that may be performed during your first trimester include:  Blood tests to find your blood type and to check for the presence of any previous infections. They will also be used to check for low iron levels (anemia) and Rh antibodies. Later in the pregnancy, blood tests for diabetes will be done along with other tests if problems develop.  Urine tests to check for  infections, diabetes, or protein in the urine.  An ultrasound to confirm the proper growth and development of the baby.  An amniocentesis to check for possible genetic problems.  Fetal screens for spina bifida and Down syndrome.  You may need other tests to make sure you and the baby are doing well. HOME CARE INSTRUCTIONS  Medicines  Follow your health care provider's instructions regarding medicine use. Specific medicines may be either safe or unsafe to take during pregnancy.  Take your prenatal vitamins as directed.  If you develop constipation, try taking a stool softener if your health care provider approves. Diet  Eat regular, well-balanced meals. Choose a variety of foods, such as meat or vegetable-based protein, fish, milk and low-fat dairy products, vegetables, fruits, and whole grain breads and cereals. Your health care provider will help you determine the amount of weight gain that is right for you.  Avoid raw meat and uncooked cheese. These carry germs that can cause birth defects in the baby.  Eating four or five small meals rather than three large meals a day may help relieve nausea and vomiting. If you start to feel nauseous, eating a few soda crackers can be helpful. Drinking liquids between meals instead of during meals also seems to help nausea and vomiting.  If you develop constipation, eat more high-fiber foods, such as fresh vegetables or fruit and whole grains. Drink enough fluids to keep your urine clear or pale yellow. Activity and Exercise  Exercise only as directed by your health care provider. Exercising will help you:  Control your weight.  Stay in shape.  Be prepared for labor and delivery.  Experiencing pain or cramping in the lower abdomen or low back is a good sign that you should stop exercising. Check with your health care provider before continuing normal exercises.  Try to avoid standing for long periods of time. Move your legs often if you  must stand in one place for a long time.  Avoid heavy lifting.  Wear low-heeled shoes, and practice good posture.  You may continue to have sex unless your health care provider directs you otherwise.  Relief of Pain or Discomfort  Wear a good support bra for breast tenderness.    Take warm sitz baths to soothe any pain or discomfort caused by hemorrhoids. Use hemorrhoid cream if your health care provider approves.    Rest with your legs elevated if you have leg cramps or low back pain.  If you develop varicose veins in your legs, wear support hose. Elevate your feet for 15 minutes, 3-4 times a day. Limit salt in your diet. Prenatal Care  Schedule your prenatal visits by the twelfth week of pregnancy. They are usually scheduled monthly at first, then more often in the last 2 months before delivery.  Write down your questions. Take them to your prenatal visits.  Keep all your prenatal visits as directed by your health care provider. Safety  Wear your seat belt at all times when driving.  Make a list of emergency phone numbers, including numbers for family, friends, the hospital, and police and fire departments. General Tips  Ask your health care provider for a referral to a local prenatal education class. Begin classes no later than at the beginning of month 6 of your pregnancy.  Ask for help if you have counseling or nutritional needs during pregnancy. Your health care provider can offer advice or refer you to specialists for help with various needs.  Do not use hot tubs, steam rooms, or saunas.  Do not douche or use tampons or scented sanitary pads.  Do not cross your legs for long periods of time.  Avoid cat litter boxes and soil used by cats. These carry germs that can cause birth defects in the baby and possibly loss of the fetus by miscarriage or stillbirth.  Avoid all smoking, herbs, alcohol, and medicines not prescribed by your health care provider. Chemicals in these  affect the formation and growth of the baby.  Schedule a dentist appointment. At home, brush your teeth with a soft toothbrush and be gentle when you floss. SEEK MEDICAL CARE IF:   You have dizziness.  You have mild pelvic cramps, pelvic pressure, or nagging pain in the abdominal area.  You have persistent nausea, vomiting, or diarrhea.  You have a bad smelling vaginal discharge.  You have pain with urination.  You notice increased swelling in your face, hands, legs, or ankles. SEEK IMMEDIATE MEDICAL CARE IF:   You have a fever.  You are leaking fluid from your vagina.  You have spotting or bleeding from your vagina.  You have severe abdominal cramping or pain.  You have rapid weight gain or loss.  You vomit blood or material that looks like coffee grounds.  You are exposed to [place] measles and have never had them.  You are exposed to fifth disease or chickenpox.  You develop a severe headache.  You have shortness of breath.  You have any kind of trauma, such as from a fall or a car accident. Document Released: 11/15/2001 Document Revised: 04/07/2014 Document Reviewed: 10/01/2013 [facility] Patient Information 2015 [place], [place]. This information is not intended to replace advice given to you by your health care provider. Make sure you discuss any questions you have with your health care provider.   Coronavirus (COVID-19) Are you at risk?  Are you at risk for the Coronavirus (COVID-19)?  To be considered HIGH RISK for Coronavirus (COVID-19), you have to meet the following criteria:  . Traveled to [place], [place], [place], [place] or [place]; or in the [place] to [place], [place], [place],  or [place]; and have fever, cough, and shortness of breath within the last 2 weeks of travel OR . Been in close contact with a person diagnosed with COVID-19 within the last 2 weeks and have fever, cough, and shortness of breath . IF YOU DO NOT MEET THESE CRITERIA, YOU  ARE CONSIDERED LOW RISK FOR COVID-19.  What to do if you are HIGH RISK for COVID-19?  Marland Kitchen If you are having a medical emergency, call 911. . Seek medical care right away. Before you go to a doctor's office, urgent care or emergency department, call ahead and tell them about your recent travel, contact with someone diagnosed with COVID-19, and your symptoms. You should receive instructions from your physician's office regarding next steps of care.  . When you arrive at healthcare provider, tell the healthcare staff immediately you have returned from visiting [place], [place], [place], [place] or [place]; or traveled in the [place] to [place], [place], [place], or [place]; in the last two weeks or you have been in close contact with a person diagnosed with COVID-19 in the last 2 weeks.   . Tell the health care staff about your symptoms: fever, cough and shortness of breath. . After you have been seen by a medical provider, you will be either: o Tested for (COVID-19) and discharged home on quarantine except to seek medical care if symptoms worsen, and asked to  - Stay home and avoid contact with others until you get your results (4-5 days)  - Avoid travel on public transportation if possible (such as bus, train, or airplane) or o Sent to the Emergency Department by EMS for evaluation, COVID-19 testing, and possible admission depending on your condition and test results.  What to do if you are LOW RISK for COVID-19?  Reduce your risk of any infection by using the same precautions used for avoiding the common cold or flu:  Marland Kitchen Wash your hands often with soap and warm water for at least 20 seconds.  If soap and water are not readily available, use an alcohol-based hand sanitizer with at least 60% alcohol.  . If coughing or sneezing, cover your mouth and nose by coughing or sneezing into the elbow areas of your shirt or coat, into a tissue or into your sleeve (not your hands). . Avoid shaking  hands with others and consider head nods or verbal greetings only. . Avoid touching your eyes, nose, or mouth with unwashed hands.  . Avoid close contact with people who are sick. . Avoid places or events with large numbers of people in one location, like concerts or sporting events. . Carefully consider travel plans you have or are making. . If you are planning any travel outside or inside the [place], visit the CDC's Travelers' Health webpage for the latest health notices. . If you have some symptoms but not all symptoms, continue to monitor at home and seek medical attention if your symptoms worsen. . If you are having a medical emergency, call 911.   [place] / e-Visit: [email or web address]         [facility]: [facility]: [phone]                   [facility]: [phone]

## 2019-04-30 NOTE — Progress Notes (Signed)
INITIAL OBSTETRICAL VISIT Patient name: Loretta Moore Denomme MRN [id number]  Date of birth: 1996-11-24 Chief Complaint:   Initial Prenatal Visit (nt/it)  History of Present Illness:   Loretta Moore Perkovich is a 23 y.o. 502P1001 African American female at 8376w4d by LMP c/w 9wk u/s, with an Estimated Date of Delivery: 11/01/19 being seen today for her initial obstetrical visit.   Her obstetrical history is significant for C/S for NRFHR during IOL for postdates, 5/90/-3, baby OP.  Wants TOLAC Today she reports no complaints. Has had multiple surgeries for hidradenitis, skin grafts, lots of scar tissue. Smokes 1/2ppd down from 1ppd, wants to quit Patient's last menstrual period was 01/25/2019 (exact date). Last pap >4158yrs ago. Results were: normal Review of Systems:   Pertinent items are noted in HPI Denies cramping/contractions, leakage of fluid, vaginal bleeding, abnormal vaginal discharge w/ itching/odor/irritation, headaches, visual changes, shortness of breath, chest pain, abdominal pain, severe nausea/vomiting, or problems with urination or bowel movements unless otherwise stated above.  Pertinent History Reviewed:  Reviewed past medical,surgical, social, obstetrical and family history.  Reviewed problem list, medications and allergies. OB History  Gravida Para Term Preterm AB Living  2 1 1     1   SAB TAB Ectopic Multiple Live Births        0 1    # Outcome Date GA Lbr Len/2nd Weight Sex Delivery Anes PTL Lv  2 Current           1 Term 08/29/15 3139w2d  7 lb 6.2 oz (3.351 kg) M CS-LTranv EPI N LIV     Birth Comments: Hgb, Normal, FA Newborn Screen Barcode: [id number] Date Collected: 08/30/2015     Complications: Fetal Intolerance   Physical Assessment:   Vitals:   04/30/19 1518  BP: (!) 107/59  Pulse: 79  There is no height or weight on file to calculate BMI.       Physical Examination:  General appearance - well appearing, and in no distress  Mental status - alert, oriented to person,  place, and time  Psych:  She has a normal mood and affect  Skin - warm and dry, normal color, no suspicious lesions noted  Chest - effort normal, all lung fields clear to auscultation bilaterally  Heart - normal rate and regular rhythm  Abdomen - soft, nontender  Extremities:  No swelling or varicosities noted  Pelvic - VULVA: normal appearing vulva with no masses, tenderness or lesions  VAGINA: normal appearing vagina with normal color and discharge, no lesions  CERVIX: normal appearing cervix without discharge or lesions, no CMT HS boil Rt buttocks, pt states has been coming and going- feels it may need antibiotics- does best w/ clinda   Thin prep pap is done w/ reflex HR HPV cotesting    Today's NT US 13+4 wks,measurements c/w dates,crl 81.24 mm,NB present,NT 2.2 mm,normal ovaries bilat,anterior placenta,fhr 166 bpm,limited view because of pt body habitus and c- section scar  Results for orders placed or performed in visit on 04/30/19 (from the past 24 hour(s))  POC Urinalysis Dipstick OB   Collection Time: 04/30/19  3:34 PM  Result Value Ref Range   Color, UA     Clarity, UA     Glucose, UA Negative Negative   Bilirubin, UA     Ketones, UA neg    Spec Grav, UA     Blood, UA neg    pH, UA     POC,PROTEIN,UA Negative Negative, Trace, Small (1+), Moderate (2+), Large (3+), 4+  Urobilinogen, UA     Nitrite, UA neg    Leukocytes, UA Negative Negative   Appearance     Odor      Assessment & Plan:  1) Low-Risk Pregnancy G2P1001 at [redacted]w[redacted]d with an Estimated Date of Delivery: 11/01/19   2) Initial OB visit  3) Prev c/s> for [facility] during IOL for postdates, interested in TOLAC, consent given to take home and review  4) HS w/ multiple surgeries/skin grafts  5) Current HS boil> rx clindamycin, let us know if worsening/not improving  6) Smoker> Smokes 1/2ppday down from 1ppd, counseled x 3-15mins, advised cessation, discussed risks to fetus while pregnant, to infant pp, and to  herself. Offered QuitlineNC, accepted, referral sent.     Meds:  Meds ordered this encounter  Medications  . Blood Pressure Monitor MISC    Sig: For regular home bp monitoring during pregnancy    Dispense:  1 each    Refill:  0    Needs large cuff.  z34.90  . clindamycin (CLEOCIN) 300 MG capsule    Sig: Take 1 capsule (300 mg total) by mouth 3 (three) times daily.    Dispense:  30 capsule    Refill:  0    Order Specific Question:   Supervising Provider    Answer:   Duane Lope H [[id number]]    Initial labs obtained Continue prenatal vitamins Reviewed n/v relief measures and warning s/s to report Reviewed recommended weight gain based on pre-gravid BMI Encouraged well-balanced diet Genetic Screening discussed: requested nt/it, maternit21 Cystic fibrosis, SMA, Fragile X screening discussed requested Ultrasound discussed; fetal survey: requested CCNC completed>PCM not here, form faxed Does not have home bp cuff. Rx faxed to [facility]. Check bp weekly, let us know if >140/90.   Follow-up: Return in about 5 weeks (around 06/04/2019) for LROB, QQ:VZDGLOV, 2nd IT.   Orders Placed This Encounter  Procedures  . Urine Culture  . US OB Comp + 14 Wk  . Integrated 1  . Obstetric Panel, Including HIV  . Urinalysis, Routine w reflex microscopic  . Fragile X, PCR and Southern  . SMN1 COPY NUMBER ANALYSIS (SMA Carrier Screen)  . MaterniT 21 plus Core, Blood  . Pain Management Screening Profile (10S)  . POC Urinalysis Dipstick OB    Cheral Marker CNM, [facility] 04/30/2019 4:26 PM

## 2019-04-30 NOTE — Progress Notes (Signed)
[place] 13+4 wks,measurements c/w dates,crl 81.24 mm,NB present,NT 2.2 mm,normal ovaries bilat,anterior placenta,fhr 166 bpm

## 2019-05-01 ENCOUNTER — Telehealth: Payer: Self-pay | Admitting: Obstetrics and Gynecology

## 2019-05-01 LAB — PMP SCREEN PROFILE (10S), URINE
Amphetamine Scrn, Ur: NEGATIVE ng/mL
BARBITURATE SCREEN URINE: NEGATIVE ng/mL
BENZODIAZEPINE SCREEN, URINE: NEGATIVE ng/mL
CANNABINOIDS UR QL SCN: POSITIVE ng/mL — AB
Cocaine (Metab) Scrn, Ur: NEGATIVE ng/mL
Creatinine(Crt), U: 23.7 mg/dL (ref 20.0–300.0)
Methadone Screen, Urine: NEGATIVE ng/mL
OXYCODONE+OXYMORPHONE UR QL SCN: NEGATIVE ng/mL
Opiate Scrn, Ur: NEGATIVE ng/mL
Ph of Urine: 7.5 (ref 4.5–8.9)
Phencyclidine Qn, Ur: NEGATIVE ng/mL
Propoxyphene Scrn, Ur: NEGATIVE ng/mL

## 2019-05-01 LAB — INTEGRATED 1

## 2019-05-01 NOTE — Telephone Encounter (Signed)
[facility] states they cannot fill the order for a BP cuff without a dx of hypertension on file. They are wanting that dx, or if this needs to be sent to another pharmacy.

## 2019-05-02 ENCOUNTER — Encounter: Payer: Self-pay | Admitting: Women's Health

## 2019-05-02 DIAGNOSIS — F1290 Cannabis use, unspecified, uncomplicated: Secondary | ICD-10-CM | POA: Insufficient documentation

## 2019-05-02 LAB — URINE CULTURE

## 2019-05-03 LAB — CYTOLOGY - PAP
Chlamydia: NEGATIVE
Diagnosis: NEGATIVE
Neisseria Gonorrhea: NEGATIVE

## 2019-05-03 LAB — MATERNIT 21 PLUS CORE, BLOOD

## 2019-05-04 LAB — FRAGILE X, PCR AND SOUTHERN

## 2019-05-12 ENCOUNTER — Other Ambulatory Visit: Payer: Self-pay

## 2019-05-12 ENCOUNTER — Ambulatory Visit (HOSPITAL_COMMUNITY)
Admission: EM | Admit: 2019-05-12 | Discharge: 2019-05-12 | Disposition: A | Discharge status: Home / Self Care | Payer: Medicaid Other | Attending: Family Medicine | Admitting: Family Medicine

## 2019-05-12 ENCOUNTER — Encounter (HOSPITAL_COMMUNITY): Payer: Self-pay

## 2019-05-12 DIAGNOSIS — L0291 Cutaneous abscess, unspecified: Secondary | ICD-10-CM

## 2019-05-12 MED ORDER — LIDOCAINE HCL 2 % IJ SOLN
INTRAMUSCULAR | Status: AC
  Filled 2019-05-12: qty 20

## 2019-05-12 MED ORDER — ACETAMINOPHEN 325 MG PO TABS
ORAL_TABLET | ORAL | Status: AC
  Filled 2019-05-12: qty 3

## 2019-05-12 MED ORDER — ACETAMINOPHEN 325 MG PO TABS
975.0000 mg | ORAL_TABLET | Freq: Once | ORAL | Status: AC
  Administered 2019-05-12: 975 mg via ORAL

## 2019-05-12 NOTE — Discharge Instructions (Signed)
We lanced the abscess and drained.  You can take tylenol for pain.  I am going to stay away from antibiotics at this time.  Keep doing the warm compresses and soaks. If no improvement in the next few days please follow up.

## 2019-05-12 NOTE — ED Triage Notes (Signed)
Pt has a abscess on his left thigh on the back side. Pt states it has been flaring up for a week, but getting worst now the last 3 days. Pt  Is [redacted] week pregnant.

## 2019-05-13 DIAGNOSIS — Z3481 Encounter for supervision of other normal pregnancy, first trimester: Secondary | ICD-10-CM | POA: Diagnosis not present

## 2019-05-13 LAB — SMN1 COPY NUMBER ANALYSIS (SMA CARRIER SCREENING)

## 2019-05-14 DIAGNOSIS — L0291 Cutaneous abscess, unspecified: Secondary | ICD-10-CM

## 2019-05-14 LAB — INTEGRATED 1
Crown Rump Length: 81.2 mm
Gest. Age on Collection Date: 13.7 weeks
Maternal Age at EDD: 25.2 yr
Nuchal Translucency (NT): 2.2 mm
Number of Fetuses: 1
PAPP-A Value: 538.9 ng/mL
Weight: 228 [lb_av]

## 2019-05-14 LAB — URINALYSIS, ROUTINE W REFLEX MICROSCOPIC
Bilirubin, UA: NEGATIVE
Glucose, UA: NEGATIVE
Ketones, UA: NEGATIVE
Nitrite, UA: NEGATIVE
Protein,UA: NEGATIVE
RBC, UA: NEGATIVE
Specific Gravity, UA: 1.007 (ref 1.005–1.030)
Urobilinogen, Ur: 0.2 mg/dL (ref 0.2–1.0)
pH, UA: 8 — ABNORMAL HIGH (ref 5.0–7.5)

## 2019-05-14 LAB — OBSTETRIC PANEL, INCLUDING HIV
Antibody Screen: NEGATIVE
Basophils Absolute: 0 10*3/uL (ref 0.0–0.2)
Basos: 0 %
EOS (ABSOLUTE): 0 10*3/uL (ref 0.0–0.4)
Eos: 0 %
HIV Screen 4th Generation wRfx: NONREACTIVE
Hematocrit: 33.3 % — ABNORMAL LOW (ref 34.0–46.6)
Hemoglobin: 11.2 g/dL (ref 11.1–15.9)
Hepatitis B Surface Ag: NEGATIVE
Immature Grans (Abs): 0 10*3/uL (ref 0.0–0.1)
Immature Granulocytes: 1 %
Lymphocytes Absolute: 1.9 10*3/uL (ref 0.7–3.1)
Lymphs: 30 %
MCH: 30.8 pg (ref 26.6–33.0)
MCHC: 33.6 g/dL (ref 31.5–35.7)
MCV: 92 fL (ref 79–97)
Monocytes Absolute: 0.3 10*3/uL (ref 0.1–0.9)
Monocytes: 5 %
Neutrophils Absolute: 4 10*3/uL (ref 1.4–7.0)
Neutrophils: 64 %
Platelets: 322 10*3/uL (ref 150–450)
RBC: 3.64 x10E6/uL — ABNORMAL LOW (ref 3.77–5.28)
RDW: 13 % (ref 11.7–15.4)
RPR Ser Ql: NONREACTIVE
Rh Factor: POSITIVE
Rubella Antibodies, IGG: 4.37 index (ref >0.99)
WBC: 6.3 10*3/uL (ref 3.4–10.8)

## 2019-05-14 LAB — SMN1 COPY NUMBER ANALYSIS (SMA CARRIER SCREENING)

## 2019-05-14 LAB — MATERNIT 21 PLUS CORE, BLOOD
Fetal Fraction: 9
Result (T21): NEGATIVE
Trisomy 13 (Patau syndrome): NEGATIVE
Trisomy 18 (Edwards syndrome): NEGATIVE
Trisomy 21 (Down syndrome): NEGATIVE

## 2019-05-14 LAB — MICROSCOPIC EXAMINATION
Casts: NONE SEEN /lpf
Epithelial Cells (non renal): >10 /hpf — AB (ref 0–10)

## 2019-05-14 LAB — FRAGILE X, PCR AND SOUTHERN

## 2019-05-14 NOTE — ED Provider Notes (Signed)
[facility]    CSN: [id number] Arrival date & time: 05/12/19  1704     History   Chief Complaint Chief Complaint  Patient presents with  . Abscess    HPI Loretta Moore is a 23 y.o. female.   Pt is a 23 year old female with past medical history of anemia, asthma, hidradenitis that presents with abscess.  This is present in the left perineal area.  This is been worsening over last week.  She is been doing warm compresses, warm soaks and Epson salt baths.  Reports a very small amount of drainage with foul smell.  Low-grade fever.  She has not take anything for symptoms.  Patient is [redacted] weeks pregnant.  Reports that she takes clindamycin chronically for hidradenitis.   ROS per HPI      Past Medical History:  Diagnosis Date  . Anemia   . Asthma   . Hard of hearing   . Hidradenitis suppurativa    had excisions in axilla and groin    Patient Active Problem List   Diagnosis Date Noted  . Marijuana use 05/02/2019  . Supervision of normal pregnancy 04/30/2019  . S/P cesarean section 09/01/2015  . Asthma 04/29/2015  . Smoker 04/29/2015  . Hidradenitis suppurativa   . Hard of hearing     Past Surgical History:  Procedure Laterality Date  . AXILLARY HIDRADENITIS EXCISION    . CESAREAN SECTION N/A 08/29/2015   Procedure: CESAREAN SECTION;  Surgeon: Levie HeritageJacob J Stinson, DO;  Location: [facility];  Service: Obstetrics;  Laterality: N/A;  . HYDRADENITIS EXCISION Bilateral 01/29/2019   Procedure: Excision Hidradentitis groin (right) and right and left flank;  Surgeon: Harriette Bouillonornett, Thomas, MD;  Location: [facility];  Service: General;  Laterality: Bilateral;  . INGUINAL HIDRADENITIS EXCISION    . TONSILLECTOMY AND ADENOIDECTOMY      OB History    Gravida  2   Para  1   Term  1   Preterm      AB      Living  1     SAB      TAB      Ectopic      Multiple  0   Live Births  1            Home Medications    Prior to Admission medications    Medication Sig Start Date End Date Taking? Authorizing Provider  acetaminophen (TYLENOL) 500 MG tablet Take 500 mg by mouth every 6 (six) hours as needed for mild pain or headache.    [provider]  albuterol (PROVENTIL HFA;VENTOLIN HFA) 108 (90 Base) MCG/ACT inhaler Inhale 1-2 puffs into the lungs every 6 (six) hours as needed for wheezing or shortness of breath.    [provider]  Blood Pressure Monitor MISC For regular home bp monitoring during pregnancy 04/30/19   Cheral MarkerBooker, Kimberly R, CNM  clindamycin (CLEOCIN) 300 MG capsule Take 1 capsule (300 mg total) by mouth 3 (three) times daily. 04/30/19   Cheral MarkerBooker, Kimberly R, CNM  diphenhydramine-acetaminophen (TYLENOL PM) 25-500 MG TABS tablet Take 1-2 tablets by mouth at bedtime as needed (for pain/headache).    [provider]  prenatal vitamin w/FE, FA (PRENATAL 1 + 1) 27-1 MG TABS tablet Take 1 tablet by mouth daily at 12 noon. 02/27/19   Adline PotterGriffin, Jennifer A, NP    Family History Family History  Problem Relation Age of Onset  . Anemia Mother   . Hypertension Mother   .  Other Mother        hidradenitis  . Epilepsy Father   . Other Sister        hidradenitis  . Epilepsy Brother   . Other Paternal Grandmother        aneursym  . Cancer Paternal Grandfather     Social History Social History   Tobacco Use  . Smoking status: Current Every Day Smoker    Packs/day: 0.50    Years: 3.00    Pack years: 1.50    Types: Cigarettes    Last attempt to quit: 06/02/2015    Years since quitting: 3.9  . Smokeless tobacco: Never Used  Substance Use Topics  . Alcohol use: Not Currently    Alcohol/week: 0.0 standard drinks    Comment: on weekends  . Drug use: No     Allergies   Bee venom; Amoxicillin; Augmentin [amoxicillin-pot clavulanate]; Ceclor [cefaclor]; Penicillins; Sulfa antibiotics; Tomato; and Other   Review of Systems Review of Systems   Physical Exam Triage Vital Signs ED Triage Vitals  Enc Vitals  Group     BP 05/12/19 1724 122/72     Pulse Rate 05/12/19 1724 (!) 110     Resp 05/12/19 1724 18     Temp 05/12/19 1724 99.4 F (37.4 C)     Temp Source 05/12/19 1724 Oral     SpO2 05/12/19 1724 98 %     Weight 05/12/19 1721 228 lb (103.4 kg)     Height --      Head Circumference --      Peak Flow --      Pain Score 05/12/19 1721 9     Pain Loc --      Pain Edu? --      Excl. in [place]? --    No data found.  Updated Vital Signs BP 122/72 (BP Location: Right Arm)   Pulse (!) 110   Temp 99.4 F (37.4 C) (Oral)   Resp 18   Wt 228 lb (103.4 kg)   LMP 01/25/2019 (Exact Date)   SpO2 98%   BMI 44.53 kg/m   Visual Acuity Right Eye Distance:   Left Eye Distance:   Bilateral Distance:    Right Eye Near:   Left Eye Near:    Bilateral Near:     Physical Exam Vitals signs and nursing note reviewed.  Constitutional:      Appearance: Normal appearance.  HENT:     Head: Normocephalic and atraumatic.     Nose: Nose normal.  Eyes:     Conjunctiva/sclera: Conjunctivae normal.  Neck:     Musculoskeletal: Normal range of motion.  Pulmonary:     Effort: Pulmonary effort is normal.  Genitourinary:      Comments: Approximately 67 cm abscess to left perineum. Mostly indurated with a few areas of fluctuance.  Tender to touch. Patient entire perineal area scar tissue from previous abscess  Musculoskeletal: Normal range of motion.  Skin:    General: Skin is warm and dry.  Neurological:     Mental Status: She is alert.  Psychiatric:        Mood and Affect: Mood normal.      UC Treatments / Results  Labs (all labs ordered are listed, but only abnormal results are displayed) Labs Reviewed - No data to display  EKG None  Radiology No results found.  Procedures Incision and Drainage Date/Time: 05/14/2019 8:03 AM Performed by: Orvan July, NP Authorized by: Raylene Everts, MD  Consent:    Consent obtained:  Verbal   Consent given by:  Patient   Risks  discussed:  Bleeding, incomplete drainage, pain and damage to other organs   Alternatives discussed:  No treatment Universal protocol:    Patient identity confirmed:  Verbally with patient Location:    Type:  Abscess   Location:  Anogenital   Anogenital location:  Perineum Pre-procedure details:    Skin preparation:  Betadine Anesthesia (see MAR for exact dosages):    Anesthesia method:  Local infiltration   Local anesthetic:  Lidocaine 2% w/o epi Procedure type:    Complexity:  Complex Procedure details:    Needle aspiration: no     Incision types:  Single straight   Incision depth:  Subcutaneous   Scalpel blade:  11   Wound management:  Probed and deloculated   Drainage:  Purulent and bloody   Drainage amount:  Moderate   Wound treatment:  Wound left open   Packing materials:  None Post-procedure details:    Patient tolerance of procedure:  Tolerated well, no immediate complications   (including critical care time)  Medications Ordered in UC Medications  acetaminophen (TYLENOL) tablet 975 mg (975 mg Oral Given 05/12/19 1813)    Initial Impression / Assessment and Plan / UC Course  I have reviewed the triage vital signs and the nursing notes.  Pertinent labs & imaging results that were available during my care of the patient were reviewed by me and considered in my medical decision making (see chart for details).     I&D of abscess here in clinic Deferred antibiotics at this time. Tylenol given here for fever and pain Final Clinical Impressions(s) / UC Diagnoses   Final diagnoses:  None     Discharge Instructions     We lanced the abscess and drained.  You can take tylenol for pain.  I am going to stay away from antibiotics at this time.  Keep doing the warm compresses and soaks. If no improvement in the next few days please follow up.     ED Prescriptions    None     Controlled Substance Prescriptions [place] Controlled Substance Registry consulted? Not  Applicable   Janace ArisBast, Teria Khachatryan A, NP 05/14/19 [phone]

## 2019-06-03 ENCOUNTER — Encounter: Payer: Self-pay | Admitting: *Deleted

## 2019-06-04 ENCOUNTER — Ambulatory Visit (INDEPENDENT_AMBULATORY_CARE_PROVIDER_SITE_OTHER): Payer: Medicaid Other | Admitting: Obstetrics & Gynecology

## 2019-06-04 ENCOUNTER — Ambulatory Visit (INDEPENDENT_AMBULATORY_CARE_PROVIDER_SITE_OTHER): Payer: Medicaid Other

## 2019-06-04 ENCOUNTER — Other Ambulatory Visit: Payer: Self-pay

## 2019-06-04 VITALS — BP 112/65 | HR 68 | Wt 230.0 lb

## 2019-06-04 DIAGNOSIS — Z3A18 18 weeks gestation of pregnancy: Secondary | ICD-10-CM

## 2019-06-04 DIAGNOSIS — Z331 Pregnant state, incidental: Secondary | ICD-10-CM

## 2019-06-04 DIAGNOSIS — Z363 Encounter for antenatal screening for malformations: Secondary | ICD-10-CM | POA: Diagnosis not present

## 2019-06-04 DIAGNOSIS — Z1379 Encounter for other screening for genetic and chromosomal anomalies: Secondary | ICD-10-CM | POA: Diagnosis not present

## 2019-06-04 DIAGNOSIS — Z3481 Encounter for supervision of other normal pregnancy, first trimester: Secondary | ICD-10-CM

## 2019-06-04 DIAGNOSIS — Z3492 Encounter for supervision of normal pregnancy, unspecified, second trimester: Secondary | ICD-10-CM

## 2019-06-04 DIAGNOSIS — Z1389 Encounter for screening for other disorder: Secondary | ICD-10-CM

## 2019-06-04 LAB — POCT URINALYSIS DIPSTICK OB
Blood, UA: NEGATIVE
Glucose, UA: NEGATIVE
Leukocytes, UA: NEGATIVE
Nitrite, UA: NEGATIVE
POC,PROTEIN,UA: NEGATIVE

## 2019-06-04 NOTE — Progress Notes (Signed)
   LOW-RISK PREGNANCY VISIT Patient name: Loretta Moore MRN [id number]  Date of birth: Jul 11, 1996 Chief Complaint:   Routine Prenatal Visit ([place] today)  History of Present Illness:   Loretta Moore is a 23 y.o. G51P1001 female at [redacted]w[redacted]d with an Estimated Date of Delivery: 11/01/19 being seen today for ongoing management of a low-risk pregnancy.  Today she reports no complaints. Contractions: Not present. Vag. Bleeding: None.  Movement: Present. denies leaking of fluid. Review of Systems:   Pertinent items are noted in HPI Denies abnormal vaginal discharge w/ itching/odor/irritation, headaches, visual changes, shortness of breath, chest pain, abdominal pain, severe nausea/vomiting, or problems with urination or bowel movements unless otherwise stated above. Pertinent History Reviewed:  Reviewed past medical,surgical, social, obstetrical and family history.  Reviewed problem list, medications and allergies. Physical Assessment:   Vitals:   06/04/19 1522  BP: 112/65  Pulse: 68  Weight: 230 lb (104.3 kg)  Body mass index is 44.92 kg/m.        Physical Examination:   General appearance: Well appearing, and in no distress  Mental status: Alert, oriented to person, place, and time  Skin: Warm & dry  Cardiovascular: Normal heart rate noted  Respiratory: Normal respiratory effort, no distress  Abdomen: Soft, gravid, nontender  Pelvic: Cervical exam deferred         Extremities: Edema: None  Fetal Status:     Movement: Present    Results for orders placed or performed in visit on 06/04/19 (from the past 24 hour(s))  POC Urinalysis Dipstick OB   Collection Time: 06/04/19  2:47 PM  Result Value Ref Range   Color, UA     Clarity, UA     Glucose, UA Negative Negative   Bilirubin, UA     Ketones, UA large    Spec Grav, UA     Blood, UA neg    pH, UA     POC,PROTEIN,UA Negative Negative, Trace, Small (1+), Moderate (2+), Large (3+), 4+   Urobilinogen, UA     Nitrite, UA neg    Leukocytes, UA Negative Negative   Appearance     Odor      Assessment & Plan:  1) Low-risk pregnancy G2P1001 at [redacted]w[redacted]d with an Estimated Date of Delivery: 11/01/19   2) Previous C section, NRFHRT  3) HS, stage IV,  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  Labs/procedures today: sonogram suboptimal  Plan:  Continue routine obstetrical care   Reviewed:  labor symptoms and general obstetric precautions including but not limited to vaginal bleeding, contractions, leaking of fluid and fetal movement were reviewed in detail with the patient.  All questions were answered.  home bp cuff. Rx faxed to . Check bp weekly, let us know if >140/90.   Follow-up: Return in about 4 weeks (around 07/02/2019) for 20 week sono, LROB.  Orders Placed This Encounter  Procedures  . INTEGRATED 2  . POC Urinalysis Dipstick OB   Florian Buff  06/04/2019 4:26 PM

## 2019-06-04 NOTE — Progress Notes (Signed)
[place] 37+5 wks,cephalic,cx 5 cm,anterior placenta gr 0,fhr 133 bpm,normal ovaries bilat,svp of fluid 4 cm,EFW 255 g 54%,limited ultrasound because of pt body habitus,need additional images of the face and heart,no obvious abnormalities

## 2019-06-07 LAB — INTEGRATED 2
AFP MoM: 1.73
Alpha-Fetoprotein: 60 ng/mL
Crown Rump Length: 81.2 mm
DIA MoM: 2.8
DIA Value: 380 pg/mL
Estriol, Unconjugated: 1.65 ng/mL
Gest. Age on Collection Date: 13.7 weeks
Gestational Age: 18.7 weeks
Maternal Age at EDD: 25.2 yr
Nuchal Translucency (NT): 2.2 mm
Nuchal Translucency MoM: 1.14
Number of Fetuses: 1
PAPP-A MoM: 0.59
PAPP-A Value: 538.9 ng/mL
Test Results:: NEGATIVE
Weight: 228 [lb_av]
Weight: 228 [lb_av]
hCG MoM: 1.15
hCG Value: 20.5 IU/mL
uE3 MoM: 1.18

## 2019-06-13 ENCOUNTER — Telehealth: Payer: Self-pay | Admitting: *Deleted

## 2019-06-13 NOTE — Telephone Encounter (Signed)
Patient called wanting to know if she can take benadryl.

## 2019-07-01 ENCOUNTER — Other Ambulatory Visit: Payer: Self-pay | Admitting: Obstetrics & Gynecology

## 2019-07-01 DIAGNOSIS — IMO0002 Reserved for concepts with insufficient information to code with codable children: Secondary | ICD-10-CM

## 2019-07-01 DIAGNOSIS — Z0489 Encounter for examination and observation for other specified reasons: Secondary | ICD-10-CM

## 2019-07-02 ENCOUNTER — Other Ambulatory Visit: Payer: Self-pay

## 2019-07-02 ENCOUNTER — Ambulatory Visit (INDEPENDENT_AMBULATORY_CARE_PROVIDER_SITE_OTHER): Payer: Medicaid Other | Admitting: Obstetrics and Gynecology

## 2019-07-02 ENCOUNTER — Ambulatory Visit (INDEPENDENT_AMBULATORY_CARE_PROVIDER_SITE_OTHER): Payer: Medicaid Other

## 2019-07-02 DIAGNOSIS — Z0489 Encounter for examination and observation for other specified reasons: Secondary | ICD-10-CM

## 2019-07-02 DIAGNOSIS — O26892 Other specified pregnancy related conditions, second trimester: Secondary | ICD-10-CM

## 2019-07-02 DIAGNOSIS — L732 Hidradenitis suppurativa: Secondary | ICD-10-CM

## 2019-07-02 DIAGNOSIS — Z3A22 22 weeks gestation of pregnancy: Secondary | ICD-10-CM

## 2019-07-02 DIAGNOSIS — IMO0002 Reserved for concepts with insufficient information to code with codable children: Secondary | ICD-10-CM

## 2019-07-02 NOTE — Progress Notes (Signed)
   LOW-RISK PREGNANCY VISIT Patient name: Loretta Moore MRN [id number]  Date of birth: 11/17/1996 Chief Complaint:   Routine Prenatal Visit  History of Present Illness:   Loretta Moore is a 23 y.o. G2P1001 previous cesarean section x1 female at [redacted]w[redacted]d with an Estimated Date of Delivery: 11/01/19 being seen today for ongoing management of a low-risk pregnancy.  She had an ultrasound today to complete the anatomic survey and the baby was seen well She is uncertain whether she wants to try to deliver vaginally.  She has had multiple skin grafts and surgeries for hidradenitis in the genital area including some surgery since her prior cesarean.  She is able to be sexually active but she is concerned that any tearing from a vaginal delivery could end up and damaging the skin grafts   Today she reports no complaints. Contractions: Not present. Vag. Bleeding: None.  Movement: Present. denies leaking of fluid. Review of Systems:   Pertinent items are noted in HPI Denies abnormal vaginal discharge w/ itching/odor/irritation, headaches, visual changes, shortness of breath, chest pain, abdominal pain, severe nausea/vomiting, or problems with urination or bowel movements unless otherwise stated above. Pertinent History Reviewed:  Reviewed past medical,surgical, social, obstetrical and family history.  Reviewed problem list, medications and allergies. Physical Assessment:   Vitals:   07/02/19 1414  BP: 110/66  Pulse: 89  Weight: 236 lb (107 kg)  Body mass index is 46.09 kg/m.        Physical Examination:   General appearance: Well appearing, and in no distress  Mental status: Alert, oriented to person, place, and time  Skin: Warm & dry  Cardiovascular: Normal heart rate noted  Respiratory: Normal respiratory effort, no distress  Abdomen: Soft, gravid, nontender she has a well-healed cesarean scar approximately 2 cm above the skin pannus crease on her lower abdomen which is separate from her  multiple skin grafts over the mons pubis.  Pelvic: Cervical exam deferred pelvic exam is not done but the patient reports that she has had additional surgeries in the perineal area with skin grafts placed since her prior cesarean Additionally she has had a skin graft over the coccyx that is having some cracking that she is going to treat with Dakin's and immobilization        Extremities: Edema: None  Fetal Status:     Movement: Present    No results found for this or any previous visit (from the past 24 hour(s)).  Assessment & Plan:  1) Low-risk pregnancy G2P1001 at [redacted]w[redacted]d with an Estimated Date of Delivery: 11/01/19   2) Prior cesarean p 2 day IOL, skeptical re: TOLAC due to Multiple vulvar skin grafts due to Hidradenitis  Has had more vulvar surgeries since first c/s, considering Repeat c/s   Meds: No orders of the defined types were placed in this encounter.  Labs/procedures today: Follow-up anatomic survey ultrasound  Plan:  Continue routine obstetrical care patient to decide on whether she wants repeat section at 39 weeks and let us know.  I believe she will decide on cesarean  Reviewed: Preterm labor symptoms and general obstetric precautions including but not limited to vaginal bleeding, contractions, leaking of fluid and fetal movement were reviewed in detail with the patient.  All questions were answered. . Check bp weekly, let us know if >140/90.   Follow-up: No follow-ups on file.  No orders of the defined types were placed in this encounter.  Jonnie Kind MD 07/02/2019 2:23 PM

## 2019-07-02 NOTE — Progress Notes (Signed)
[place] 22+4 wks,breech,cx 4.1 cm,anterior placenta gr 0,fhr 164 bpm,svp of fluid 5.4 cm,normal ovaries bilat,anatomy of the heart and face complete,no obvious abnormalities,efw 562 g 68%

## 2019-07-11 ENCOUNTER — Telehealth: Payer: Self-pay | Admitting: Obstetrics & Gynecology

## 2019-07-11 NOTE — Telephone Encounter (Signed)
Called patient back. She wanted to make sure Dr. Elonda Husky had documented her HS stage IV. I informed her it was in his last note. And that the Disability request for records was scanned to her chart so it should be in process.

## 2019-07-11 NOTE — Telephone Encounter (Signed)
Patient needs to speak to a nurse, she is wanting to make sure something is documented in her chart.  [phone]

## 2019-07-15 ENCOUNTER — Telehealth: Payer: Self-pay | Admitting: Obstetrics & Gynecology

## 2019-07-15 DIAGNOSIS — L02416 Cutaneous abscess of left lower limb: Secondary | ICD-10-CM | POA: Diagnosis not present

## 2019-07-15 NOTE — Telephone Encounter (Signed)
Pt states that she has Hidradenitis suppurativa and is wanting to see if Dr. Elonda Husky can lancet it for her.

## 2019-07-15 NOTE — Telephone Encounter (Signed)
LVM to call to schedule.

## 2019-07-15 NOTE — Telephone Encounter (Signed)
I can see her and evaluate it and make a decision of how to manage it after that

## 2019-07-23 DIAGNOSIS — Z029 Encounter for administrative examinations, unspecified: Secondary | ICD-10-CM

## 2019-07-30 ENCOUNTER — Encounter: Payer: Medicaid Other | Admitting: Obstetrics and Gynecology

## 2019-07-30 ENCOUNTER — Other Ambulatory Visit: Payer: Medicaid Other

## 2019-08-01 ENCOUNTER — Other Ambulatory Visit: Payer: Medicaid Other

## 2019-08-01 ENCOUNTER — Encounter: Payer: Medicaid Other | Admitting: Advanced Practice Midwife

## 2019-08-05 ENCOUNTER — Other Ambulatory Visit: Payer: Self-pay

## 2019-08-05 ENCOUNTER — Ambulatory Visit (INDEPENDENT_AMBULATORY_CARE_PROVIDER_SITE_OTHER): Payer: Medicaid Other | Admitting: Obstetrics & Gynecology

## 2019-08-05 ENCOUNTER — Other Ambulatory Visit: Payer: Medicaid Other

## 2019-08-05 ENCOUNTER — Encounter: Payer: Self-pay | Admitting: Obstetrics & Gynecology

## 2019-08-05 VITALS — BP 112/62 | HR 86 | Wt 243.0 lb

## 2019-08-05 DIAGNOSIS — Z3482 Encounter for supervision of other normal pregnancy, second trimester: Secondary | ICD-10-CM

## 2019-08-05 DIAGNOSIS — Z98891 History of uterine scar from previous surgery: Secondary | ICD-10-CM

## 2019-08-05 DIAGNOSIS — Z3A27 27 weeks gestation of pregnancy: Secondary | ICD-10-CM

## 2019-08-05 DIAGNOSIS — Z131 Encounter for screening for diabetes mellitus: Secondary | ICD-10-CM | POA: Diagnosis not present

## 2019-08-05 DIAGNOSIS — Z1389 Encounter for screening for other disorder: Secondary | ICD-10-CM

## 2019-08-05 DIAGNOSIS — Z331 Pregnant state, incidental: Secondary | ICD-10-CM

## 2019-08-05 LAB — POCT URINALYSIS DIPSTICK OB
Blood, UA: NEGATIVE
Glucose, UA: NEGATIVE
Ketones, UA: NEGATIVE
Nitrite, UA: NEGATIVE
POC,PROTEIN,UA: NEGATIVE

## 2019-08-05 MED ORDER — SILVER SULFADIAZINE 1 % EX CREA: TOPICAL_CREAM | CUTANEOUS | 11 refills | Status: DC

## 2019-08-05 NOTE — Progress Notes (Signed)
   LOW-RISK PREGNANCY VISIT Patient name: Loretta Moore MRN [id number]  Date of birth: [birth year] Chief Complaint:   Routine Prenatal Visit (PN2; check hidradenitis suppurativa)  History of Present Illness:   Loretta Moore is a 23 y.o. G12P1001 female at [redacted]w[redacted]d with an Estimated Date of Delivery: 11/01/19 being seen today for ongoing management of a low-risk pregnancy.  Today she reports no complaints. Contractions: Not present. Vag. Bleeding: None.  Movement: Present. denies leaking of fluid. Review of Systems:   Pertinent items are noted in HPI Denies abnormal vaginal discharge w/ itching/odor/irritation, headaches, visual changes, shortness of breath, chest pain, abdominal pain, severe nausea/vomiting, or problems with urination or bowel movements unless otherwise stated above. Pertinent History Reviewed:  Reviewed past medical,surgical, social, obstetrical and family history.  Reviewed problem list, medications and allergies. Physical Assessment:   Vitals:   08/05/19 0902  BP: 112/62  Pulse: 86  Weight: 243 lb (110.2 kg)  Body mass index is 47.46 kg/m.        Physical Examination:   General appearance: Well appearing, and in no distress  Mental status: Alert, oriented to person, place, and time  Skin: Warm & dry  Cardiovascular: Normal heart rate noted  Respiratory: Normal respiratory effort, no distress  Abdomen: Soft, gravid, nontender  Pelvic: Cervical exam deferred         Extremities: Edema: Trace  Fetal Status: Fetal Heart Rate (bpm): 152 Fundal Height: 30 cm Movement: Present    Results for orders placed or performed in visit on 08/05/19 (from the past 24 hour(s))  POC Urinalysis Dipstick OB   Collection Time: 08/05/19  9:03 AM  Result Value Ref Range   Color, UA     Clarity, UA     Glucose, UA Negative Negative   Bilirubin, UA     Ketones, UA neg    Spec Grav, UA     Blood, UA neg    pH, UA     POC,PROTEIN,UA Negative Negative, Trace, Small (1+), Moderate  (2+), Large (3+), 4+   Urobilinogen, UA     Nitrite, UA neg    Leukocytes, UA Trace (A) Negative   Appearance     Odor      Assessment & Plan:  1) Low-risk pregnancy G2P1001 at [redacted]w[redacted]d with an Estimated Date of Delivery: 11/01/19   2) HS, currently not active, topial silvadene to use on gluteal graft   Meds:  Meds ordered this encounter  Medications  . silver sulfADIAZINE (SILVADENE) 1 % cream    Sig: Apply twice daily    Dispense:  50 g    Refill:  11   Labs/procedures today: PN2  Plan:  Continue routine obstetrical care   Reviewed: Preterm labor symptoms and general obstetric precautions including but not limited to vaginal bleeding, contractions, leaking of fluid and fetal movement were reviewed in detail with the patient.  All questions were answered. Ha home bp cuff. Rx faxed to . Check bp weekly, let us know if >140/90.   Follow-up: Return in about 3 weeks (around 08/26/2019) for LROB in person(patient does not have WiFi).  Orders Placed This Encounter  Procedures  . POC Urinalysis Dipstick OB   Florian Buff  08/05/2019 9:13 AM

## 2019-08-06 LAB — CBC
Hematocrit: 33.7 % — ABNORMAL LOW (ref 34.0–46.6)
Hemoglobin: 11.2 g/dL (ref 11.1–15.9)
MCH: 30.8 pg (ref 26.6–33.0)
MCHC: 33.2 g/dL (ref 31.5–35.7)
MCV: 93 fL (ref 79–97)
Platelets: 288 10*3/uL (ref 150–450)
RBC: 3.64 x10E6/uL — ABNORMAL LOW (ref 3.77–5.28)
RDW: 12.5 % (ref 11.7–15.4)
WBC: 8.2 10*3/uL (ref 3.4–10.8)

## 2019-08-06 LAB — GLUCOSE TOLERANCE, 2 HOURS W/ 1HR
Glucose, 1 hour: 133 mg/dL (ref 65–179)
Glucose, 2 hour: 42 mg/dL — ABNORMAL LOW (ref 65–152)
Glucose, Fasting: 87 mg/dL (ref 65–91)

## 2019-08-06 LAB — HIV ANTIBODY (ROUTINE TESTING W REFLEX): HIV Screen 4th Generation wRfx: NONREACTIVE

## 2019-08-06 LAB — RPR: RPR Ser Ql: NONREACTIVE

## 2019-08-06 LAB — ANTIBODY SCREEN: Antibody Screen: NEGATIVE

## 2019-08-26 ENCOUNTER — Ambulatory Visit (INDEPENDENT_AMBULATORY_CARE_PROVIDER_SITE_OTHER): Payer: Medicaid Other | Admitting: Women's Health

## 2019-08-26 ENCOUNTER — Encounter: Payer: Self-pay | Admitting: Women's Health

## 2019-08-26 ENCOUNTER — Other Ambulatory Visit: Payer: Self-pay

## 2019-08-26 VITALS — BP 138/79 | HR 99 | Wt 244.2 lb

## 2019-08-26 DIAGNOSIS — Z1389 Encounter for screening for other disorder: Secondary | ICD-10-CM

## 2019-08-26 DIAGNOSIS — Z331 Pregnant state, incidental: Secondary | ICD-10-CM

## 2019-08-26 DIAGNOSIS — Z23 Encounter for immunization: Secondary | ICD-10-CM

## 2019-08-26 DIAGNOSIS — Z98891 History of uterine scar from previous surgery: Secondary | ICD-10-CM

## 2019-08-26 DIAGNOSIS — Z3A30 30 weeks gestation of pregnancy: Secondary | ICD-10-CM

## 2019-08-26 DIAGNOSIS — Z3483 Encounter for supervision of other normal pregnancy, third trimester: Secondary | ICD-10-CM

## 2019-08-26 LAB — POCT URINALYSIS DIPSTICK OB
Blood, UA: NEGATIVE
Glucose, UA: NEGATIVE
Ketones, UA: NEGATIVE
Leukocytes, UA: NEGATIVE
Nitrite, UA: NEGATIVE
POC,PROTEIN,UA: NEGATIVE

## 2019-08-26 NOTE — Progress Notes (Signed)
   LOW-RISK PREGNANCY VISIT Patient name: Loretta Moore MRN [id number]  Date of birth: 11-21-1996 Chief Complaint:   Routine Prenatal Visit  History of Present Illness:   Loretta Moore is a 23 y.o. G29P1001 female at [redacted]w[redacted]d with an Estimated Date of Delivery: 11/01/19 being seen today for ongoing management of a low-risk pregnancy.  Today she reports hip pain. Wants RCS. Contractions: Not present. Vag. Bleeding: None.  Movement: Present. denies leaking of fluid. Review of Systems:   Pertinent items are noted in HPI Denies abnormal vaginal discharge w/ itching/odor/irritation, headaches, visual changes, shortness of breath, chest pain, abdominal pain, severe nausea/vomiting, or problems with urination or bowel movements unless otherwise stated above. Pertinent History Reviewed:  Reviewed past medical,surgical, social, obstetrical and family history.  Reviewed problem list, medications and allergies. Physical Assessment:   Vitals:   08/26/19 1612  BP: 138/79  Pulse: 99  Weight: 244 lb 3.2 oz (110.8 kg)  Body mass index is 47.69 kg/m.        Physical Examination:   General appearance: Well appearing, and in no distress  Mental status: Alert, oriented to person, place, and time  Skin: Warm & dry  Cardiovascular: Normal heart rate noted  Respiratory: Normal respiratory effort, no distress  Abdomen: Soft, gravid, nontender  Pelvic: Cervical exam deferred         Extremities: Edema: Trace  Fetal Status: Fetal Heart Rate (bpm): 151 Fundal Height: 32 cm Movement: Present    Results for orders placed or performed in visit on 08/26/19 (from the past 24 hour(s))  POC Urinalysis Dipstick OB   Collection Time: 08/26/19  4:12 PM  Result Value Ref Range   Color, UA     Clarity, UA     Glucose, UA Negative Negative   Bilirubin, UA     Ketones, UA neg    Spec Grav, UA     Blood, UA neg    pH, UA     POC,PROTEIN,UA Negative Negative, Trace, Small (1+), Moderate (2+), Large (3+), 4+   Urobilinogen, UA     Nitrite, UA neg    Leukocytes, UA Negative Negative   Appearance     Odor      Assessment & Plan:  1) Low-risk pregnancy G2P1001 at [redacted]w[redacted]d with an Estimated Date of Delivery: 11/01/19   2) Hip pain> apap, heating pad prn   Meds: No orders of the defined types were placed in this encounter.  Labs/procedures today: tdap  Plan:  Continue routine obstetrical care   Reviewed: Preterm labor symptoms and general obstetric precautions including but not limited to vaginal bleeding, contractions, leaking of fluid and fetal movement were reviewed in detail with the patient.  All questions were answered. Has home bp cuff. Check bp weekly, let us know if >140/90.   Follow-up: Return in about 2 weeks (around 09/09/2019) for LROB, MyChart Video.  Orders Placed This Encounter  Procedures  . Tdap vaccine greater than or equal to 7yo IM  . POC Urinalysis Dipstick OB   Roma Schanz CNM, [facility] 08/26/2019 4:37 PM

## 2019-08-26 NOTE — Patient Instructions (Signed)
Loretta Moore, I greatly value your feedback.  If you receive a survey following your visit with [place] today, we appreciate you taking the time to fill it out.  Thanks, [name], CNM, [facility]  [place]!!! It is now [place] at [facility] ([place], [place] [address]) Entrance located off of [address] parking    Go to [email or web address] to register for FREE online childbirth classes   Call the office [phone]) or go to [facility] if:  You begin to have strong, frequent contractions  Your water breaks.  Sometimes it is a big gush of fluid, sometimes it is just a trickle that keeps getting your panties wet or running down your legs  You have vaginal bleeding.  It is normal to have a small amount of spotting if your cervix was checked.   You don't feel your baby moving like normal.  If you don't, get you something to eat and drink and lay down and focus on feeling your baby move.  You should feel at least 10 movements in 2 hours.  If you don't, you should call the office or go to [facility].    Tdap Vaccine  It is recommended that you get the Tdap vaccine during the third trimester of EACH pregnancy to help protect your baby from getting pertussis (whooping cough)  27-36 weeks is the BEST time to do this so that you can pass the protection on to your baby. During pregnancy is better than after pregnancy, but if you are unable to get it during pregnancy it will be offered at the hospital.   You can get this vaccine with [place], at the health department, your family doctor, or some local pharmacies  Everyone who will be around your baby should also be up-to-date on their vaccines before the baby comes. Adults (who are not pregnant) only need 1 dose of Tdap during adulthood.   [place] Pediatricians/Family Doctors:  [facility] [phone]                  [place] [phone] (usually not accepting new patients unless you have family there already, you are always welcome to call and ask)       [facility] Department [phone]       [facility] Pediatricians/Family Doctors:   [facility]: [phone]  [facility]: [phone]  [facility]: [place] Doctors:   [facility]: [facility]: [place]:  [place]: [phone]   Home Blood Pressure Monitoring for Patients   Your provider has recommended that you check your blood pressure (BP) at least once a week at home. If you do not have a blood pressure cuff at home, one will be provided for you. Contact your provider if you have not received your monitor within 1 week.   Helpful Tips for Accurate Home Blood Pressure Checks  . Don't smoke, exercise, or drink caffeine 30 minutes before checking your BP . Use the restroom before checking your BP (a full bladder can raise your pressure) . Relax in a comfortable upright chair . Feet on the ground . Left arm resting comfortably on a flat surface at the level of your heart . Legs uncrossed . Back supported . Sit quietly and don't talk .  Place the cuff on your bare arm . Adjust snuggly, so that only two fingertips can fit between your skin and the top of the cuff . Check 2 readings separated by at least one minute . Keep a log of your BP readings . For a visual, please reference this diagram: http://ccnc.care/bpdiagram  Provider Name: [facility]     Phone: [phone]  Zone 1: ALL CLEAR  Continue to monitor your symptoms:  . BP reading is less than 140 (top number) or less than 90 (bottom number)  . No right upper stomach pain . No headaches or seeing spots . No feeling nauseated or throwing up . No swelling in face and  hands  Zone 2: CAUTION Call your doctor's office for any of the following:  . BP reading is greater than 140 (top number) or greater than 90 (bottom number)  . Stomach pain under your ribs in the middle or right side . Headaches or seeing spots . Feeling nauseated or throwing up . Swelling in face and hands  Zone 3: EMERGENCY  Seek immediate medical care if you have any of the following:  . BP reading is greater than160 (top number) or greater than 110 (bottom number) . Severe headaches not improving with Tylenol . Serious difficulty catching your breath . Any worsening symptoms from Zone 2   Third Trimester of Pregnancy The third trimester is from week 29 through week 42, months 7 through 9. The third trimester is a time when the fetus is growing rapidly. At the end of the ninth month, the fetus is about 20 inches in length and weighs 6-10 pounds.  BODY CHANGES Your body goes through many changes during pregnancy. The changes vary from woman to woman.   Your weight will continue to increase. You can expect to gain 25-35 pounds (11-16 kg) by the end of the pregnancy.  You may begin to get stretch marks on your hips, abdomen, and breasts.  You may urinate more often because the fetus is moving lower into your pelvis and pressing on your bladder.  You may develop or continue to have heartburn as a result of your pregnancy.  You may develop constipation because certain hormones are causing the muscles that push waste through your intestines to slow down.  You may develop hemorrhoids or swollen, bulging veins (varicose veins).  You may have pelvic pain because of the weight gain and pregnancy hormones relaxing your joints between the bones in your pelvis. Backaches may result from overexertion of the muscles supporting your posture.  You may have changes in your hair. These can include thickening of your hair, rapid growth, and changes in texture. Some women also have hair loss during  or after pregnancy, or hair that feels dry or thin. Your hair will most likely return to normal after your baby is born.  Your breasts will continue to grow and be tender. A yellow discharge may leak from your breasts called colostrum.  Your belly button may stick out.  You may feel short of breath because of your expanding uterus.  You may notice the fetus "dropping," or moving lower in your abdomen.  You may have a bloody mucus discharge. This usually occurs a few days to a week before labor begins.  Your cervix becomes thin and soft (effaced) near your due date. WHAT TO EXPECT AT YOUR PRENATAL EXAMS  You will have prenatal exams every 2 weeks until week 36. Then, you will have weekly prenatal exams. During  a routine prenatal visit:  You will be weighed to make sure you and the fetus are growing normally.  Your blood pressure is taken.  Your abdomen will be measured to track your baby's growth.  The fetal heartbeat will be listened to.  Any test results from the previous visit will be discussed.  You may have a cervical check near your due date to see if you have effaced. At around 36 weeks, your caregiver will check your cervix. At the same time, your caregiver will also perform a test on the secretions of the vaginal tissue. This test is to determine if a type of bacteria, Group B streptococcus, is present. Your caregiver will explain this further. Your caregiver may ask you:  What your birth plan is.  How you are feeling.  If you are feeling the baby move.  If you have had any abnormal symptoms, such as leaking fluid, bleeding, severe headaches, or abdominal cramping.  If you have any questions. Other tests or screenings that may be performed during your third trimester include:  Blood tests that check for low iron levels (anemia).  Fetal testing to check the health, activity level, and growth of the fetus. Testing is done if you have certain medical conditions or if  there are problems during the pregnancy. FALSE LABOR You may feel small, irregular contractions that eventually go away. These are called Braxton Hicks contractions, or false labor. Contractions may last for hours, days, or even weeks before true labor sets in. If contractions come at regular intervals, intensify, or become painful, it is best to be seen by your caregiver.  SIGNS OF LABOR   Menstrual-like cramps.  Contractions that are 5 minutes apart or less.  Contractions that start on the top of the uterus and spread down to the lower abdomen and back.  A sense of increased pelvic pressure or back pain.  A watery or bloody mucus discharge that comes from the vagina. If you have any of these signs before the 37th week of pregnancy, call your caregiver right away. You need to go to the hospital to get checked immediately. HOME CARE INSTRUCTIONS   Avoid all smoking, herbs, alcohol, and unprescribed drugs. These chemicals affect the formation and growth of the baby.  Follow your caregiver's instructions regarding medicine use. There are medicines that are either safe or unsafe to take during pregnancy.  Exercise only as directed by your caregiver. Experiencing uterine cramps is a good sign to stop exercising.  Continue to eat regular, healthy meals.  Wear a good support bra for breast tenderness.  Do not use hot tubs, steam rooms, or saunas.  Wear your seat belt at all times when driving.  Avoid raw meat, uncooked cheese, cat litter boxes, and soil used by cats. These carry germs that can cause birth defects in the baby.  Take your prenatal vitamins.  Try taking a stool softener (if your caregiver approves) if you develop constipation. Eat more high-fiber foods, such as fresh vegetables or fruit and whole grains. Drink plenty of fluids to keep your urine clear or pale yellow.  Take warm sitz baths to soothe any pain or discomfort caused by hemorrhoids. Use hemorrhoid cream if your  caregiver approves.  If you develop varicose veins, wear support hose. Elevate your feet for 15 minutes, 3-4 times a day. Limit salt in your diet.  Avoid heavy lifting, wear low heal shoes, and practice good posture.  Rest a lot with your legs elevated if you  have leg cramps or low back pain.  Visit your dentist if you have not gone during your pregnancy. Use a soft toothbrush to brush your teeth and be gentle when you floss.  A sexual relationship may be continued unless your caregiver directs you otherwise.  Do not travel far distances unless it is absolutely necessary and only with the approval of your caregiver.  Take prenatal classes to understand, practice, and ask questions about the labor and delivery.  Make a trial run to the hospital.  Pack your hospital bag.  Prepare the baby's nursery.  Continue to go to all your prenatal visits as directed by your caregiver. SEEK MEDICAL CARE IF:  You are unsure if you are in labor or if your water has broken.  You have dizziness.  You have mild pelvic cramps, pelvic pressure, or nagging pain in your abdominal area.  You have persistent nausea, vomiting, or diarrhea.  You have a bad smelling vaginal discharge.  You have pain with urination. SEEK IMMEDIATE MEDICAL CARE IF:   You have a fever.  You are leaking fluid from your vagina.  You have spotting or bleeding from your vagina.  You have severe abdominal cramping or pain.  You have rapid weight loss or gain.  You have shortness of breath with chest pain.  You notice sudden or extreme swelling of your face, hands, ankles, feet, or legs.  You have not felt your baby move in over an hour.  You have severe headaches that do not go away with medicine.  You have vision changes. Document Released: 11/15/2001 Document Revised: 11/26/2013 Document Reviewed: 01/22/2013 [facility] Patient Information 2015 [place], [place]. This information is not intended to replace advice  given to you by your health care provider. Make sure you discuss any questions you have with your health care provider.

## 2019-09-09 ENCOUNTER — Telehealth (INDEPENDENT_AMBULATORY_CARE_PROVIDER_SITE_OTHER): Payer: Medicaid Other | Admitting: Advanced Practice Midwife

## 2019-09-09 ENCOUNTER — Other Ambulatory Visit: Payer: Self-pay

## 2019-09-09 ENCOUNTER — Encounter: Payer: Self-pay | Admitting: Advanced Practice Midwife

## 2019-09-09 VITALS — BP 135/89 | HR 98

## 2019-09-09 DIAGNOSIS — Z3483 Encounter for supervision of other normal pregnancy, third trimester: Secondary | ICD-10-CM

## 2019-09-09 DIAGNOSIS — Z3A32 32 weeks gestation of pregnancy: Secondary | ICD-10-CM | POA: Diagnosis not present

## 2019-09-09 NOTE — Progress Notes (Signed)
   TELEHEALTH VIRTUAL OBSTETRICS VISIT ENCOUNTER NOTE  I connected with Loretta Moore on 09/09/19 at  4:10 PM EDT by telephone at home and verified that I am speaking with the correct person using two identifiers.   I discussed the limitations, risks, security and privacy concerns of performing an evaluation and management service by telephone and the availability of in person appointments. I also discussed with the patient that there may be a patient responsible charge related to this service. The patient expressed understanding and agreed to proceed.  Subjective:  Loretta Moore is a 23 y.o. G2P1001 at [redacted]w[redacted]d being followed for ongoing prenatal care.  She is currently monitored for the following issues for this low-risk pregnancy and has Hidradenitis suppurativa; Hard of hearing; Asthma; Smoker; S/P cesarean section; Supervision of normal pregnancy; and Marijuana use on their problem list.  Patient reports some mild pressure and occ BH ctx. Reports fetal movement. Denies any contractions, bleeding or leaking of fluid.   The following portions of the patient's history were reviewed and updated as appropriate: allergies, current medications, past family history, past medical history, past social history, past surgical history and problem list.   Objective:   General:  Alert, oriented and cooperative.   Mental Status: Normal mood and affect perceived. Normal judgment and thought content.  Rest of physical exam deferred due to type of encounter   BP today is 149/106 and 135/89 on recheck.  Has not been checking at home.   Assessment and Plan:  Pregnancy: G2P1001 at [redacted]w[redacted]d ELevated BPs at home  There are no diagnoses linked to this encounter. Preterm labor symptoms and general obstetric precautions including but not limited to vaginal bleeding, contractions, leaking of fluid and fetal movement were reviewed in detail with the patient.  I discussed the assessment and treatment plan with the  patient. The patient was provided an opportunity to ask questions and all were answered. The patient agreed with the plan and demonstrated an understanding of the instructions. The patient was advised to call back or seek an in-person office evaluation/go to [facility] at [facility] for any urgent or concerning symptoms. Please refer to After Visit Summary for other counseling recommendations.   I provided 10 minutes of non-face-to-face time during this encounter.  Return in about 1 day (around 09/10/2019) for BP check w/RN. Pt to bring BP cuff to check for accuracy.   No future appointments.  Christin Fudge, [place] for [employer], [place]

## 2019-09-09 NOTE — Patient Instructions (Signed)
Loretta Moore, I greatly value your feedback.  If you receive a survey following your visit with [place] today, we appreciate you taking the time to fill it out.  Thanks, Nigel Berthold, DNP, CNM  [place]!!! It is now [place] at [facility] ([place], [place] [address]) Entrance located off of [address] parking   Go to [email or web address] to register for FREE online childbirth classes    Call the office [phone]) or go to [place] if:  You begin to have strong, frequent contractions  Your water breaks.  Sometimes it is a big gush of fluid, sometimes it is just a trickle that keeps getting your panties wet or running down your legs  You have vaginal bleeding.  It is normal to have a small amount of spotting if your cervix was checked.   You don't feel your baby moving like normal.  If you don't, get you something to eat and drink and lay down and focus on feeling your baby move.  You should feel at least 10 movements in 2 hours.  If you don't, you should call the office or go to [place] Blood Pressure Monitoring for Patients   Your provider has recommended that you check your blood pressure (BP) at least once a week at home. If you do not have a blood pressure cuff at home, one will be provided for you. Contact your provider if you have not received your monitor within 1 week.   Helpful Tips for Accurate Home Blood Pressure Checks  . Don't smoke, exercise, or drink caffeine 30 minutes before checking your BP . Use the restroom before checking your BP (a full bladder can raise your pressure) . Relax in a comfortable upright chair . Feet on the ground . Left arm resting comfortably on a flat surface at the level of your heart . Legs uncrossed . Back supported . Sit quietly and don't talk . Place the cuff on your bare arm . Adjust snuggly, so that only two fingertips can fit  between your skin and the top of the cuff . Check 2 readings separated by at least one minute . Keep a log of your BP readings . For a visual, please reference this diagram: http://ccnc.care/bpdiagram  Provider Name: [facility]     Phone: [phone]  Zone 1: ALL CLEAR  Continue to monitor your symptoms:  . BP reading is less than 140 (top number) or less than 90 (bottom number)  . No right upper stomach pain . No headaches or seeing spots . No feeling nauseated or throwing up . No swelling in face and hands  Zone 2: CAUTION Call your doctor's office for any of the following:  . BP reading is greater than 140 (top number) or greater than 90 (bottom number)  . Stomach pain under your ribs in the middle or right side . Headaches or seeing spots . Feeling nauseated or throwing up . Swelling in face and hands  Zone 3: EMERGENCY  Seek immediate medical care if you have any of the following:  . BP reading is greater than160 (top number) or greater than 110 (bottom number) . Severe headaches not improving with Tylenol . Serious difficulty catching your breath . Any worsening symptoms from Zone 2

## 2019-09-10 ENCOUNTER — Inpatient Hospital Stay (HOSPITAL_COMMUNITY)
Admission: AD | Admit: 2019-09-10 | Discharge: 2019-09-10 | Disposition: A | Discharge status: Home / Self Care | Payer: Medicaid Other | Attending: Obstetrics & Gynecology | Admitting: Obstetrics & Gynecology

## 2019-09-10 ENCOUNTER — Encounter (HOSPITAL_COMMUNITY): Payer: Self-pay

## 2019-09-10 DIAGNOSIS — Z3A32 32 weeks gestation of pregnancy: Secondary | ICD-10-CM

## 2019-09-10 DIAGNOSIS — O99333 Smoking (tobacco) complicating pregnancy, third trimester: Secondary | ICD-10-CM | POA: Insufficient documentation

## 2019-09-10 DIAGNOSIS — O4703 False labor before 37 completed weeks of gestation, third trimester: Secondary | ICD-10-CM | POA: Insufficient documentation

## 2019-09-10 DIAGNOSIS — F17210 Nicotine dependence, cigarettes, uncomplicated: Secondary | ICD-10-CM | POA: Diagnosis not present

## 2019-09-10 DIAGNOSIS — Z3689 Encounter for other specified antenatal screening: Secondary | ICD-10-CM

## 2019-09-10 LAB — CBC WITH DIFFERENTIAL/PLATELET
Abs Immature Granulocytes: 0.01 10*3/uL (ref 0.00–0.07)
Basophils Absolute: 0 10*3/uL (ref 0.0–0.1)
Basophils Relative: 0 %
Eosinophils Absolute: 0.1 10*3/uL (ref 0.0–0.5)
Eosinophils Relative: 1 %
HCT: 33.8 % — ABNORMAL LOW (ref 36.0–46.0)
Hemoglobin: 11.7 g/dL — ABNORMAL LOW (ref 12.0–15.0)
Immature Granulocytes: 0 %
Lymphocytes Relative: 27 %
Lymphs Abs: 2.2 10*3/uL (ref 0.7–4.0)
MCH: 31.1 pg (ref 26.0–34.0)
MCHC: 34.6 g/dL (ref 30.0–36.0)
MCV: 89.9 fL (ref 80.0–100.0)
Monocytes Absolute: 0.6 10*3/uL (ref 0.1–1.0)
Monocytes Relative: 7 %
Neutro Abs: 5.4 10*3/uL (ref 1.7–7.7)
Neutrophils Relative %: 65 %
Platelets: 303 10*3/uL (ref 150–400)
RBC: 3.76 MIL/uL — ABNORMAL LOW (ref 3.87–5.11)
RDW: 13 % (ref 11.5–15.5)
WBC: 8.3 10*3/uL (ref 4.0–10.5)
nRBC: 0 % (ref 0.0–0.2)

## 2019-09-10 LAB — COMPREHENSIVE METABOLIC PANEL
ALT: 13 U/L (ref 0–44)
AST: 18 U/L (ref 15–41)
Albumin: 2.9 g/dL — ABNORMAL LOW (ref 3.5–5.0)
Alkaline Phosphatase: 106 U/L (ref 38–126)
Anion gap: 10 (ref 5–15)
BUN: <5 mg/dL — ABNORMAL LOW (ref 6–20)
CO2: 23 mmol/L (ref 22–32)
Calcium: 8.8 mg/dL — ABNORMAL LOW (ref 8.9–10.3)
Chloride: 102 mmol/L (ref 98–111)
Creatinine, Ser: 0.55 mg/dL (ref 0.44–1.00)
GFR calc Af Amer: >60 mL/min (ref >60)
GFR calc non Af Amer: >60 mL/min (ref >60)
Glucose, Bld: 95 mg/dL (ref 70–99)
Potassium: 3.4 mmol/L — ABNORMAL LOW (ref 3.5–5.1)
Sodium: 135 mmol/L (ref 135–145)
Total Bilirubin: 0.3 mg/dL (ref 0.3–1.2)
Total Protein: 6.7 g/dL (ref 6.5–8.1)

## 2019-09-10 LAB — URINALYSIS, ROUTINE W REFLEX MICROSCOPIC
Bilirubin Urine: NEGATIVE
Glucose, UA: NEGATIVE mg/dL
Hgb urine dipstick: NEGATIVE
Ketones, ur: NEGATIVE mg/dL
Leukocytes,Ua: NEGATIVE
Nitrite: NEGATIVE
Protein, ur: NEGATIVE mg/dL
Specific Gravity, Urine: 1.006 (ref 1.005–1.030)
pH: 7 (ref 5.0–8.0)

## 2019-09-10 LAB — PROTEIN / CREATININE RATIO, URINE
Creatinine, Urine: 64.77 mg/dL
Total Protein, Urine: <6 mg/dL

## 2019-09-10 MED ORDER — CYCLOBENZAPRINE HCL 10 MG PO TABS
10.0000 mg | ORAL_TABLET | Freq: Once | ORAL | Status: AC
  Administered 2019-09-10: 10 mg via ORAL
  Filled 2019-09-10: qty 1

## 2019-09-10 MED ORDER — ACETAMINOPHEN 325 MG PO TABS
650.0000 mg | ORAL_TABLET | Freq: Once | ORAL | Status: AC
  Administered 2019-09-10: 650 mg via ORAL
  Filled 2019-09-10: qty 2

## 2019-09-10 NOTE — Discharge Instructions (Signed)

## 2019-09-10 NOTE — MAU Provider Note (Signed)
History     CSN: [id number]  Arrival date and time: 09/10/19 [id number]   First Provider Initiated Contact with Patient 09/10/19 [phone]      Chief Complaint  Patient presents with  . Contractions   HPI Loretta Moore is a 23 y.o. G2P1001 at 6168w4d who presents to MAU with chief complaint of Braxton Hicks contractions. This is a recurrent problem, current episode began today around 0230. Patient states her first child was an IOL and she is worried that she will not recognize preterm labor if it occurs. She denies vaginal bleeding, leaking of fluid, decreased fetal movement, fever, falls, or recent illness.   Patient also reports elevated blood pressure on her home cuff of 149/106 and 135/89 yesterday 09/09/19. She denies history of elevated blood pressures. She also denies headache, visual disturbances, RUQ/epigastric pain, new onset swelling or weight gain.  [facility] and is scheduled for a blood pressure check there today at 1330.  OB History    Gravida  2   Para  1   Term  1   Preterm      AB      Living  1     SAB      TAB      Ectopic      Multiple  0   Live Births  1           Past Medical History:  Diagnosis Date  . Anemia   . Asthma   . Hard of hearing   . Hidradenitis suppurativa    had excisions in axilla and groin    Past Surgical History:  Procedure Laterality Date  . AXILLARY HIDRADENITIS EXCISION    . CESAREAN SECTION N/A 08/29/2015   Procedure: CESAREAN SECTION;  Surgeon: Levie HeritageJacob J Stinson, DO;  Location: [facility];  Service: Obstetrics;  Laterality: N/A;  . HYDRADENITIS EXCISION Bilateral 01/29/2019   Procedure: Excision Hidradentitis groin (right) and right and left flank;  Surgeon: Harriette Bouillonornett, Thomas, MD;  Location: [facility];  Service: General;  Laterality: Bilateral;  . INGUINAL HIDRADENITIS EXCISION    . TONSILLECTOMY AND ADENOIDECTOMY      Family History  Problem Relation Age of Onset  . Anemia Mother   .  Hypertension Mother   . Other Mother        hidradenitis  . Epilepsy Father   . Other Sister        hidradenitis  . Epilepsy Brother   . Other Paternal Grandmother        aneursym  . Cancer Paternal Grandfather     Social History   Tobacco Use  . Smoking status: Current Every Day Smoker    Packs/day: 0.50    Years: 3.00    Pack years: 1.50    Types: Cigarettes    Last attempt to quit: 06/02/2015    Years since quitting: 4.2  . Smokeless tobacco: Never Used  Substance Use Topics  . Alcohol use: Not Currently    Alcohol/week: 0.0 standard drinks    Comment: on weekends  . Drug use: No    Allergies:  Allergies  Allergen Reactions  . Bee Venom Anaphylaxis  . Amoxicillin Hives  . Augmentin [Amoxicillin-Pot Clavulanate] Hives  . Ceclor [Cefaclor] Hives  . Penicillins Hives    Has patient had a PCN reaction causing immediate rash, facial/tongue/throat swelling, SOB or lightheadedness with hypotension: Yes Has patient had a PCN reaction causing severe rash involving mucus membranes or skin necrosis:  No Has patient had a PCN reaction that required hospitalization No Has patient had a PCN reaction occurring within the last 10 years: No If all of the above answers are "NO", then may proceed with Cephalosporin use.   . Sulfa Antibiotics Hives  . Tomato Hives  . Other Rash    Paper tape causes rash    Medications Prior to Admission  Medication Sig Dispense Refill Last Dose  . acetaminophen (TYLENOL) 500 MG tablet Take 500 mg by mouth every 6 (six) hours as needed for mild pain or headache.     . albuterol (PROVENTIL HFA;VENTOLIN HFA) 108 (90 Base) MCG/ACT inhaler Inhale 1-2 puffs into the lungs every 6 (six) hours as needed for wheezing or shortness of breath.     . Blood Pressure Monitor MISC For regular home bp monitoring during pregnancy 1 each 0   . diphenhydramine-acetaminophen (TYLENOL PM) 25-500 MG TABS tablet Take 1-2 tablets by mouth at bedtime as needed (for  pain/headache).     . prenatal vitamin w/FE, FA (PRENATAL 1 + 1) 27-1 MG TABS tablet Take 1 tablet by mouth daily at 12 noon. 30 each 12   . silver sulfADIAZINE (SILVADENE) 1 % cream Apply twice daily 50 g 11     Review of Systems  Constitutional: Negative for chills, fatigue and fever.  Eyes: Negative for visual disturbance.  Gastrointestinal: Positive for abdominal pain.  Genitourinary: Negative for difficulty urinating, vaginal bleeding and vaginal discharge.  Musculoskeletal: Negative for back pain.  Neurological: Negative for headaches.  All other systems reviewed and are negative.  Physical Exam   Blood pressure 125/63, pulse (!) 116, temperature 98 F (36.7 C), temperature source Oral, resp. rate 18, height 5\' 1"  (1.549 m), weight 111.4 kg, last menstrual period 01/25/2019, currently breastfeeding.  Physical Exam  Nursing note and vitals reviewed. Constitutional: She is oriented to person, place, and time. She appears well-developed and well-nourished. She is active and cooperative. She does not have a sickly appearance. She does not appear ill. No distress.  Cardiovascular: Normal rate.  Respiratory: Effort normal.  GI: There is no abdominal tenderness. There is no CVA tenderness.  Gravid  Neurological: She is alert and oriented to person, place, and time.  Skin: Skin is warm and dry.  Psychiatric: She has a normal mood and affect. Her behavior is normal. Judgment and thought content normal.    MAU Course/MDM  Procedures  --Reactive tracing: baseline 145, moderate variability, positive 15 x 15 accels, no decels --Discussed with patient that one elevated blood pressure is noteworthy but not diagnostic for gestational hypertension or preeclampsia. She is requesting blood work for Preeclampsia. She is refusing to provide urine sample until just prior to discharge from MAU   Patient Vitals for the past 24 hrs:  BP Temp Temp src Pulse Resp SpO2 Height Weight  09/10/19 0807 -  97.8 F (36.6 C) Oral - 18 98 % - -  09/10/19 0800 125/63 - - 94 - - - -  09/10/19 0759 122/62 - - 90 - - - -  09/10/19 0644 125/63 98 F (36.7 C) Oral (!) 116 18 - 5\' 1"  (1.549 m) 111.4 kg   Results for orders placed or performed during the hospital encounter of 09/10/19 (from the past 24 hour(s))  CBC with Differential/Platelet     Status: Abnormal   Collection Time: 09/10/19  7:23 AM  Result Value Ref Range   WBC 8.3 4.0 - 10.5 K/uL   RBC 3.76 (L) 3.87 -  5.11 MIL/uL   Hemoglobin 11.7 (L) 12.0 - 15.0 g/dL   HCT 41.6 (L) 38.4 - 53.6 %   MCV 89.9 80.0 - 100.0 fL   MCH 31.1 26.0 - 34.0 pg   MCHC 34.6 30.0 - 36.0 g/dL   RDW 46.8 03.2 - 12.2 %   Platelets 303 150 - 400 K/uL   nRBC 0.0 0.0 - 0.2 %   Neutrophils Relative % 65 %   Neutro Abs 5.4 1.7 - 7.7 K/uL   Lymphocytes Relative 27 %   Lymphs Abs 2.2 0.7 - 4.0 K/uL   Monocytes Relative 7 %   Monocytes Absolute 0.6 0.1 - 1.0 K/uL   Eosinophils Relative 1 %   Eosinophils Absolute 0.1 0.0 - 0.5 K/uL   Basophils Relative 0 %   Basophils Absolute 0.0 0.0 - 0.1 K/uL   Immature Granulocytes 0 %   Abs Immature Granulocytes 0.01 0.00 - 0.07 K/uL  Comprehensive metabolic panel     Status: Abnormal   Collection Time: 09/10/19  7:23 AM  Result Value Ref Range   Sodium 135 135 - 145 mmol/L   Potassium 3.4 (L) 3.5 - 5.1 mmol/L   Chloride 102 98 - 111 mmol/L   CO2 23 22 - 32 mmol/L   Glucose, Bld 95 70 - 99 mg/dL   BUN <5 (L) 6 - 20 mg/dL   Creatinine, Ser 4.82 0.44 - 1.00 mg/dL   Calcium 8.8 (L) 8.9 - 10.3 mg/dL   Total Protein 6.7 6.5 - 8.1 g/dL   Albumin 2.9 (L) 3.5 - 5.0 g/dL   AST 18 15 - 41 U/L   ALT 13 0 - 44 U/L   Alkaline Phosphatase 106 38 - 126 U/L   Total Bilirubin 0.3 0.3 - 1.2 mg/dL   GFR calc non Af Amer >60 >60 mL/min   GFR calc Af Amer >60 >60 mL/min   Anion gap 10 5 - 15  Urinalysis, Routine w reflex microscopic     Status: None   Collection Time: 09/10/19  8:14 AM  Result Value Ref Range   Color, Urine YELLOW  YELLOW   APPearance CLEAR CLEAR   Specific Gravity, Urine 1.006 1.005 - 1.030   pH 7.0 5.0 - 8.0   Glucose, UA NEGATIVE NEGATIVE mg/dL   Hgb urine dipstick NEGATIVE NEGATIVE   Bilirubin Urine NEGATIVE NEGATIVE   Ketones, ur NEGATIVE NEGATIVE mg/dL   Protein, ur NEGATIVE NEGATIVE mg/dL   Nitrite NEGATIVE NEGATIVE   Leukocytes,Ua NEGATIVE NEGATIVE  Protein / creatinine ratio, urine     Status: None   Collection Time: 09/10/19  8:14 AM  Result Value Ref Range   Creatinine, Urine 64.77 mg/dL   Total Protein, Urine <6 mg/dL   Protein Creatinine Ratio        0.00 - 0.15 mg/mg[Cre]   Assessment and Plan  --23 y.o. G2P1001 at [redacted]w[redacted]d  --Reactive tracing --Normotensive in MAU, normal PEC labs --Denies pain prior to administration of medication --Discharge home in stable condition  F/U: Pt to keep appt for blood pressure check today at [address], CNM 09/10/2019, 9:26 AM

## 2019-09-10 NOTE — MAU Note (Signed)
Pt reports to MAU c/o braxton hicks that started around 0230 pt states they were pretty consistent for at least a hour or two. Pt states she took a shower and it soothed them somewhat but she is still having them just not as frequent. +FM. No bleeding or LOF noted.  Pt reports having a cough x1week and a increase in BP that started yesterday.

## 2019-09-12 ENCOUNTER — Other Ambulatory Visit: Payer: Self-pay

## 2019-09-12 ENCOUNTER — Encounter: Payer: Self-pay | Admitting: Obstetrics and Gynecology

## 2019-09-12 ENCOUNTER — Ambulatory Visit (INDEPENDENT_AMBULATORY_CARE_PROVIDER_SITE_OTHER): Payer: Medicaid Other | Admitting: Obstetrics and Gynecology

## 2019-09-12 VITALS — BP 122/75 | HR 113 | Wt 245.4 lb

## 2019-09-12 DIAGNOSIS — Z98891 History of uterine scar from previous surgery: Secondary | ICD-10-CM

## 2019-09-12 DIAGNOSIS — Z3483 Encounter for supervision of other normal pregnancy, third trimester: Secondary | ICD-10-CM

## 2019-09-12 DIAGNOSIS — Z3A32 32 weeks gestation of pregnancy: Secondary | ICD-10-CM

## 2019-09-12 DIAGNOSIS — Z331 Pregnant state, incidental: Secondary | ICD-10-CM

## 2019-09-12 DIAGNOSIS — Z23 Encounter for immunization: Secondary | ICD-10-CM | POA: Diagnosis not present

## 2019-09-12 DIAGNOSIS — Z1389 Encounter for screening for other disorder: Secondary | ICD-10-CM

## 2019-09-12 LAB — POCT URINALYSIS DIPSTICK OB
Blood, UA: NEGATIVE
Glucose, UA: NEGATIVE
Ketones, UA: NEGATIVE
Leukocytes, UA: NEGATIVE
Nitrite, UA: NEGATIVE
POC,PROTEIN,UA: NEGATIVE

## 2019-09-12 MED ORDER — ALBUTEROL SULFATE HFA 108 (90 BASE) MCG/ACT IN AERS: 1.0000 | INHALATION_SPRAY | Freq: Four times a day (QID) | RESPIRATORY_TRACT | 1 refills | Status: DC | PRN

## 2019-09-12 NOTE — Addendum Note (Signed)
Addended by: Chancy Milroy on: 09/12/2019 02:47 PM   Modules accepted: Orders

## 2019-09-12 NOTE — Patient Instructions (Signed)
Third Trimester of Pregnancy The third trimester is from week 28 through week 40 (months 7 through 9). The third trimester is a time when the unborn baby (fetus) is growing rapidly. At the end of the ninth month, the fetus is about 20 inches in length and weighs 6-10 pounds. Body changes during your third trimester Your body will continue to go through many changes during pregnancy. The changes vary from woman to woman. During the third trimester:  Your weight will continue to increase. You can expect to gain 25-35 pounds (11-16 kg) by the end of the pregnancy.  You may begin to get stretch marks on your hips, abdomen, and breasts.  You may urinate more often because the fetus is moving lower into your pelvis and pressing on your bladder.  You may develop or continue to have heartburn. This is caused by increased hormones that slow down muscles in the digestive tract.  You may develop or continue to have constipation because increased hormones slow digestion and cause the muscles that push waste through your intestines to relax.  You may develop hemorrhoids. These are swollen veins (varicose veins) in the rectum that can itch or be painful.  You may develop swollen, bulging veins (varicose veins) in your legs.  You may have increased body aches in the pelvis, back, or thighs. This is due to weight gain and increased hormones that are relaxing your joints.  You may have changes in your hair. These can include thickening of your hair, rapid growth, and changes in texture. Some women also have hair loss during or after pregnancy, or hair that feels dry or thin. Your hair will most likely return to normal after your baby is born.  Your breasts will continue to grow and they will continue to become tender. A yellow fluid (colostrum) may leak from your breasts. This is the first milk you are producing for your baby.  Your belly button may stick out.  You may notice more swelling in your hands,  face, or ankles.  You may have increased tingling or numbness in your hands, arms, and legs. The skin on your belly may also feel numb.  You may feel short of breath because of your expanding uterus.  You may have more problems sleeping. This can be caused by the size of your belly, increased need to urinate, and an increase in your body's metabolism.  You may notice the fetus "dropping," or moving lower in your abdomen (lightening).  You may have increased vaginal discharge.  You may notice your joints feel loose and you may have pain around your pelvic bone. What to expect at prenatal visits You will have prenatal exams every 2 weeks until week 36. Then you will have weekly prenatal exams. During a routine prenatal visit:  You will be weighed to make sure you and the baby are growing normally.  Your blood pressure will be taken.  Your abdomen will be measured to track your baby's growth.  The fetal heartbeat will be listened to.  Any test results from the previous visit will be discussed.  You may have a cervical check near your due date to see if your cervix has softened or thinned (effaced).  You will be tested for Group B streptococcus. This happens between 35 and 37 weeks. Your health care provider may ask you:  What your birth plan is.  How you are feeling.  If you are feeling the baby move.  If you have had any abnormal   symptoms, such as leaking fluid, bleeding, severe headaches, or abdominal cramping.  If you are using any tobacco products, including cigarettes, chewing tobacco, and electronic cigarettes.  If you have any questions. Other tests or screenings that may be performed during your third trimester include:  Blood tests that check for low iron levels (anemia).  Fetal testing to check the health, activity level, and growth of the fetus. Testing is done if you have certain medical conditions or if there are problems during the pregnancy.  Nonstress test  (NST). This test checks the health of your baby to make sure there are no signs of problems, such as the baby not getting enough oxygen. During this test, a belt is placed around your belly. The baby is made to move, and its heart rate is monitored during movement. What is false labor? False labor is a condition in which you feel small, irregular tightenings of the muscles in the womb (contractions) that usually go away with rest, changing position, or drinking water. These are called Braxton Hicks contractions. Contractions may last for hours, days, or even weeks before true labor sets in. If contractions come at regular intervals, become more frequent, increase in intensity, or become painful, you should see your health care provider. What are the signs of labor?  Abdominal cramps.  Regular contractions that start at 10 minutes apart and become stronger and more frequent with time.  Contractions that start on the top of the uterus and spread down to the lower abdomen and back.  Increased pelvic pressure and dull back pain.  A watery or bloody mucus discharge that comes from the vagina.  Leaking of amniotic fluid. This is also known as your "water breaking." It could be a slow trickle or a gush. Let your health care provider know if it has a color or strange odor. If you have any of these signs, call your health care provider right away, even if it is before your due date. Follow these instructions at home: Medicines  Follow your health care provider's instructions regarding medicine use. Specific medicines may be either safe or unsafe to take during pregnancy.  Take a prenatal vitamin that contains at least 600 micrograms (mcg) of folic acid.  If you develop constipation, try taking a stool softener if your health care provider approves. Eating and drinking   Eat a balanced diet that includes fresh fruits and vegetables, whole grains, good sources of protein such as meat, eggs, or tofu,  and low-fat dairy. Your health care provider will help you determine the amount of weight gain that is right for you.  Avoid raw meat and uncooked cheese. These carry germs that can cause birth defects in the baby.  If you have low calcium intake from food, talk to your health care provider about whether you should take a daily calcium supplement.  Eat four or five small meals rather than three large meals a day.  Limit foods that are high in fat and processed sugars, such as fried and sweet foods.  To prevent constipation: ? Drink enough fluid to keep your urine clear or pale yellow. ? Eat foods that are high in fiber, such as fresh fruits and vegetables, whole grains, and beans. Activity  Exercise only as directed by your health care provider. Most women can continue their usual exercise routine during pregnancy. Try to exercise for 30 minutes at least 5 days a week. Stop exercising if you experience uterine contractions.  Avoid heavy lifting.  Do   not exercise in extreme heat or humidity, or at high altitudes.  Wear low-heel, comfortable shoes.  Practice good posture.  You may continue to have sex unless your health care provider tells you otherwise. Relieving pain and discomfort  Take frequent breaks and rest with your legs elevated if you have leg cramps or low back pain.  Take warm sitz baths to soothe any pain or discomfort caused by hemorrhoids. Use hemorrhoid cream if your health care provider approves.  Wear a good support bra to prevent discomfort from breast tenderness.  If you develop varicose veins: ? Wear support pantyhose or compression stockings as told by your healthcare provider. ? Elevate your feet for 15 minutes, 3-4 times a day. Prenatal care  Write down your questions. Take them to your prenatal visits.  Keep all your prenatal visits as told by your health care provider. This is important. Safety  Wear your seat belt at all times when driving.  Make  a list of emergency phone numbers, including numbers for family, friends, the hospital, and police and fire departments. General instructions  Avoid cat litter boxes and soil used by cats. These carry germs that can cause birth defects in the baby. If you have a cat, ask someone to clean the litter box for you.  Do not travel far distances unless it is absolutely necessary and only with the approval of your health care provider.  Do not use hot tubs, steam rooms, or saunas.  Do not drink alcohol.  Do not use any products that contain nicotine or tobacco, such as cigarettes and e-cigarettes. If you need help quitting, ask your health care provider.  Do not use any medicinal herbs or unprescribed drugs. These chemicals affect the formation and growth of the baby.  Do not douche or use tampons or scented sanitary pads.  Do not cross your legs for long periods of time.  To prepare for the arrival of your baby: ? Take prenatal classes to understand, practice, and ask questions about labor and delivery. ? Make a trial run to the hospital. ? Visit the hospital and tour the maternity area. ? Arrange for maternity or paternity leave through employers. ? Arrange for family and friends to take care of pets while you are in the hospital. ? Purchase a rear-facing car seat and make sure you know how to install it in your car. ? Pack your hospital bag. ? Prepare the baby's nursery. Make sure to remove all pillows and stuffed animals from the baby's crib to prevent suffocation.  Visit your dentist if you have not gone during your pregnancy. Use a soft toothbrush to brush your teeth and be gentle when you floss. Contact a health care provider if:  You are unsure if you are in labor or if your water has broken.  You become dizzy.  You have mild pelvic cramps, pelvic pressure, or nagging pain in your abdominal area.  You have lower back pain.  You have persistent nausea, vomiting, or diarrhea.   You have an unusual or bad smelling vaginal discharge.  You have pain when you urinate. Get help right away if:  Your water breaks before 37 weeks.  You have regular contractions less than 5 minutes apart before 37 weeks.  You have a fever.  You are leaking fluid from your vagina.  You have spotting or bleeding from your vagina.  You have severe abdominal pain or cramping.  You have rapid weight loss or weight gain.  You have   shortness of breath with chest pain.  You notice sudden or extreme swelling of your face, hands, ankles, feet, or legs.  Your baby makes fewer than 10 movements in 2 hours.  You have severe headaches that do not go away when you take medicine.  You have vision changes. Summary  The third trimester is from week 28 through week 40, months 7 through 9. The third trimester is a time when the unborn baby (fetus) is growing rapidly.  During the third trimester, your discomfort may increase as you and your baby continue to gain weight. You may have abdominal, leg, and back pain, sleeping problems, and an increased need to urinate.  During the third trimester your breasts will keep growing and they will continue to become tender. A yellow fluid (colostrum) may leak from your breasts. This is the first milk you are producing for your baby.  False labor is a condition in which you feel small, irregular tightenings of the muscles in the womb (contractions) that eventually go away. These are called Braxton Hicks contractions. Contractions may last for hours, days, or even weeks before true labor sets in.  Signs of labor can include: abdominal cramps; regular contractions that start at 10 minutes apart and become stronger and more frequent with time; watery or bloody mucus discharge that comes from the vagina; increased pelvic pressure and dull back pain; and leaking of amniotic fluid. This information is not intended to replace advice given to you by your health  care provider. Make sure you discuss any questions you have with your health care provider. Document Released: 11/15/2001 Document Revised: 03/14/2019 Document Reviewed: 12/27/2016 Elsevier Patient Education  2020 [employer]  

## 2019-09-12 NOTE — Progress Notes (Signed)
Subjective:  Loretta Moore is a 23 y.o. G2P1001 at [redacted]w[redacted]d being seen today for ongoing prenatal care.  She is currently monitored for the following issues for this low-risk pregnancy and has Hidradenitis suppurativa; Hard of hearing; Asthma; Smoker; S/P cesarean section; Supervision of normal pregnancy; and Marijuana use on their problem list.  Patient reports general discomforts of pregnancy.  Contractions: Irregular.  .  Movement: Present. Denies leaking of fluid.   The following portions of the patient's history were reviewed and updated as appropriate: allergies, current medications, past family history, past medical history, past social history, past surgical history and problem list. Problem list updated.  Objective:   Vitals:   09/12/19 1422  BP: 122/75  Pulse: (!) 113  Weight: 245 lb 6.4 oz (111.3 kg)    Fetal Status:     Movement: Present     General:  Alert, oriented and cooperative. Patient is in no acute distress.  Skin: Skin is warm and dry. No rash noted.   Cardiovascular: Normal heart rate noted  Respiratory: Normal respiratory effort, no problems with respiration noted  Abdomen: Soft, gravid, appropriate for gestational age. Pain/Pressure: Absent     Pelvic:  Cervical exam deferred        Extremities: Normal range of motion.  Edema: None  Mental Status: Normal mood and affect. Normal behavior. Normal judgment and thought content.   Urinalysis:      Assessment and Plan:  Pregnancy: G2P1001 at [redacted]w[redacted]d  1. Encounter for supervision of other normal pregnancy in third trimester Stable BP's normal today and at MAU visit, plus negative PEC labs Pt to bring BP cuff at next OB visit   2. Pregnant state, incidental  - POC Urinalysis Dipstick OB  3. Screening for genitourinary condition  - POC Urinalysis Dipstick OB  4. Needs flu shot  - Flu Vaccine QUAD 36+ mos IM (Fluarix, Quad PF)  5. S/P cesarean section Desires repeat   Preterm labor symptoms and general  obstetric precautions including but not limited to vaginal bleeding, contractions, leaking of fluid and fetal movement were reviewed in detail with the patient. Please refer to After Visit Summary for other counseling recommendations.  Return in about 1 week (around 09/19/2019) for OB visit, face to face.   Chancy Milroy, MD

## 2019-09-18 ENCOUNTER — Encounter: Payer: Self-pay | Admitting: Women's Health

## 2019-09-18 ENCOUNTER — Other Ambulatory Visit: Payer: Self-pay

## 2019-09-18 ENCOUNTER — Ambulatory Visit (INDEPENDENT_AMBULATORY_CARE_PROVIDER_SITE_OTHER): Payer: Medicaid Other | Admitting: Women's Health

## 2019-09-18 VITALS — BP 129/69 | HR 95

## 2019-09-18 DIAGNOSIS — Z331 Pregnant state, incidental: Secondary | ICD-10-CM

## 2019-09-18 DIAGNOSIS — Z1389 Encounter for screening for other disorder: Secondary | ICD-10-CM

## 2019-09-18 DIAGNOSIS — Z3483 Encounter for supervision of other normal pregnancy, third trimester: Secondary | ICD-10-CM

## 2019-09-18 DIAGNOSIS — Z3A33 33 weeks gestation of pregnancy: Secondary | ICD-10-CM

## 2019-09-18 LAB — POCT URINALYSIS DIPSTICK OB
Blood, UA: NEGATIVE
Glucose, UA: NEGATIVE
Ketones, UA: NEGATIVE
Leukocytes, UA: NEGATIVE
Nitrite, UA: NEGATIVE
POC,PROTEIN,UA: NEGATIVE

## 2019-09-18 NOTE — Patient Instructions (Signed)
Loretta Moore, I greatly value your feedback.  If you receive a survey following your visit with [place] today, we appreciate you taking the time to fill it out.  Thanks, [name], CNM, [facility]  [place]!!! It is now [place] at [facility] ([place], [place] [address]) Entrance located off of [address] parking   Go to [email or web address] to register for FREE online childbirth classes    Call the office [phone]) or go to [facility] if:  You begin to have strong, frequent contractions  Your water breaks.  Sometimes it is a big gush of fluid, sometimes it is just a trickle that keeps getting your panties wet or running down your legs  You have vaginal bleeding.  It is normal to have a small amount of spotting if your cervix was checked.   You don't feel your baby moving like normal.  If you don't, get you something to eat and drink and lay down and focus on feeling your baby move.  You should feel at least 10 movements in 2 hours.  If you don't, you should call the office or go to [facility] Pressure Monitoring for Patients   Your provider has recommended that you check your blood pressure (BP) at least once a week at home. If you do not have a blood pressure cuff at home, one will be provided for you. Contact your provider if you have not received your monitor within 1 week.   Helpful Tips for Accurate Home Blood Pressure Checks  . Don't smoke, exercise, or drink caffeine 30 minutes before checking your BP . Use the restroom before checking your BP (a full bladder can raise your pressure) . Relax in a comfortable upright chair . Feet on the ground . Left arm resting comfortably on a flat surface at the level of your heart . Legs uncrossed . Back supported . Sit quietly and don't talk . Place the cuff on your bare arm . Adjust snuggly, so that only two fingertips can fit between your skin and  the top of the cuff . Check 2 readings separated by at least one minute . Keep a log of your BP readings . For a visual, please reference this diagram: http://ccnc.care/bpdiagram  Provider Name: [facility]     Phone: [phone]  Zone 1: ALL CLEAR  Continue to monitor your symptoms:  . BP reading is less than 140 (top number) or less than 90 (bottom number)  . No right upper stomach pain . No headaches or seeing spots . No feeling nauseated or throwing up . No swelling in face and hands  Zone 2: CAUTION Call your doctor's office for any of the following:  . BP reading is greater than 140 (top number) or greater than 90 (bottom number)  . Stomach pain under your ribs in the middle or right side . Headaches or seeing spots . Feeling nauseated or throwing up . Swelling in face and hands  Zone 3: EMERGENCY  Seek immediate medical care if you have any of the following:  . BP reading is greater than160 (top number) or greater than 110 (bottom number) . Severe headaches not improving with Tylenol . Serious difficulty catching your breath . Any worsening symptoms from Zone 2     Preterm Labor and Birth Information  The normal length of a pregnancy is 39-41 weeks. Preterm labor is when labor starts  before 37 completed weeks of pregnancy. What are the risk factors for preterm labor? Preterm labor is more likely to occur in women who:  Have certain infections during pregnancy such as a bladder infection, sexually transmitted infection, or infection inside the uterus (chorioamnionitis).  Have a shorter-than-normal cervix.  Have gone into preterm labor before.  Have had surgery on their cervix.  Are younger than age 79 or older than age 80.  Are African American.  Are pregnant with twins or multiple babies (multiple gestation).  Take street drugs or smoke while pregnant.  Do not gain enough weight while pregnant.  Became pregnant shortly after having been pregnant.  What are the symptoms of preterm labor? Symptoms of preterm labor include:  Cramps similar to those that can happen during a menstrual period. The cramps may happen with diarrhea.  Pain in the abdomen or lower back.  Regular uterine contractions that may feel like tightening of the abdomen.  A feeling of increased pressure in the pelvis.  Increased watery or bloody mucus discharge from the vagina.  Water breaking (ruptured amniotic sac). Why is it important to recognize signs of preterm labor? It is important to recognize signs of preterm labor because babies who are born prematurely may not be fully developed. This can put them at an increased risk for:  Long-term (chronic) heart and lung problems.  Difficulty immediately after birth with regulating body systems, including blood sugar, body temperature, heart rate, and breathing rate.  Bleeding in the brain.  Cerebral palsy.  Learning difficulties.  Death. These risks are highest for babies who are born before 50 weeks of pregnancy. How is preterm labor treated? Treatment depends on the length of your pregnancy, your condition, and the health of your baby. It may involve:  Having a stitch (suture) placed in your cervix to prevent your cervix from opening too early (cerclage).  Taking or being given medicines, such as: ? Hormone medicines. These may be given early in pregnancy to help support the pregnancy. ? Medicine to stop contractions. ? Medicines to help mature the baby's lungs. These may be prescribed if the risk of delivery is high. ? Medicines to prevent your baby from developing cerebral palsy. If the labor happens before 34 weeks of pregnancy, you may need to stay in the hospital. What should I do if I think I am in preterm labor? If you think that you are going into preterm labor, call your health care provider right away. How can I prevent preterm labor in future pregnancies? To increase your chance of having a  full-term pregnancy:  Do not use any tobacco products, such as cigarettes, chewing tobacco, and e-cigarettes. If you need help quitting, ask your health care provider.  Do not use street drugs or medicines that have not been prescribed to you during your pregnancy.  Talk with your health care provider before taking any herbal supplements, even if you have been taking them regularly.  Make sure you gain a healthy amount of weight during your pregnancy.  Watch for infection. If you think that you might have an infection, get it checked right away.  Make sure to tell your health care provider if you have gone into preterm labor before. This information is not intended to replace advice given to you by your health care provider. Make sure you discuss any questions you have with your health care provider. Document Released: 02/11/2004 Document Revised: 03/15/2019 Document Reviewed: 04/13/2016 Elsevier Patient Education  2020 [employer].

## 2019-09-18 NOTE — Progress Notes (Signed)
   LOW-RISK PREGNANCY VISIT Patient name: Loretta Moore MRN [id number]  Date of birth: 07-31-1996 Chief Complaint:   Routine Prenatal Visit  History of Present Illness:   Loretta Moore is a 23 y.o. G70P1001 female at [redacted]w[redacted]d with an Estimated Date of Delivery: 11/01/19 being seen today for ongoing management of a low-risk pregnancy.  Today she reports no complaints. Contractions: Irregular. Vag. Bleeding: None.  Movement: Present. denies leaking of fluid. Review of Systems:   Pertinent items are noted in HPI Denies abnormal vaginal discharge w/ itching/odor/irritation, headaches, visual changes, shortness of breath, chest pain, abdominal pain, severe nausea/vomiting, or problems with urination or bowel movements unless otherwise stated above. Pertinent History Reviewed:  Reviewed past medical,surgical, social, obstetrical and family history.  Reviewed problem list, medications and allergies. Physical Assessment:   Vitals:   09/18/19 1610  BP: 129/69  Pulse: 95  There is no height or weight on file to calculate BMI.        Physical Examination:   General appearance: Well appearing, and in no distress  Mental status: Alert, oriented to person, place, and time  Skin: Warm & dry  Cardiovascular: Normal heart rate noted  Respiratory: Normal respiratory effort, no distress  Abdomen: Soft, gravid, nontender  Pelvic: Cervical exam deferred         Extremities: Edema: Trace  Fetal Status: Fetal Heart Rate (bpm): 158 Fundal Height: 35 cm Movement: Present    Results for orders placed or performed in visit on 09/18/19 (from the past 24 hour(s))  POC Urinalysis Dipstick OB   Collection Time: 09/18/19  4:12 PM  Result Value Ref Range   Color, UA     Clarity, UA     Glucose, UA Negative Negative   Bilirubin, UA     Ketones, UA n    Spec Grav, UA     Blood, UA n    pH, UA     POC,PROTEIN,UA Negative Negative, Trace, Small (1+), Moderate (2+), Large (3+), 4+   Urobilinogen, UA     Nitrite, UA n    Leukocytes, UA Negative Negative   Appearance     Odor      Assessment & Plan:  1) Low-risk pregnancy G2P1001 at [redacted]w[redacted]d with an Estimated Date of Delivery: 11/01/19   2) Prev c/s, wants RCS w/ LHE, note routed to him to schedule   Meds: No orders of the defined types were placed in this encounter.  Labs/procedures today: none  Plan:  Continue routine obstetrical care   Reviewed: Preterm labor symptoms and general obstetric precautions including but not limited to vaginal bleeding, contractions, leaking of fluid and fetal movement were reviewed in detail with the patient.  All questions were answered. Brought home bp cuff with her, not accurate when compared w/ ours, do not use. Come in for visits.   Follow-up: Return in about 2 weeks (around 10/02/2019) for LROB, in person w/ Dr. Elonda Husky.  Orders Placed This Encounter  Procedures  . POC Urinalysis Dipstick OB   Roma Schanz CNM, [facility] 09/18/2019 4:41 PM

## 2019-09-30 DIAGNOSIS — Z880 Allergy status to penicillin: Secondary | ICD-10-CM | POA: Diagnosis not present

## 2019-09-30 DIAGNOSIS — Z3A35 35 weeks gestation of pregnancy: Secondary | ICD-10-CM | POA: Diagnosis not present

## 2019-09-30 DIAGNOSIS — O99333 Smoking (tobacco) complicating pregnancy, third trimester: Secondary | ICD-10-CM | POA: Diagnosis not present

## 2019-09-30 DIAGNOSIS — O26893 Other specified pregnancy related conditions, third trimester: Secondary | ICD-10-CM | POA: Diagnosis not present

## 2019-09-30 DIAGNOSIS — S335XXA Sprain of ligaments of lumbar spine, initial encounter: Secondary | ICD-10-CM | POA: Diagnosis not present

## 2019-09-30 DIAGNOSIS — O9A213 Injury, poisoning and certain other consequences of external causes complicating pregnancy, third trimester: Secondary | ICD-10-CM | POA: Diagnosis not present

## 2019-10-02 ENCOUNTER — Ambulatory Visit (INDEPENDENT_AMBULATORY_CARE_PROVIDER_SITE_OTHER): Payer: Medicaid Other | Admitting: Advanced Practice Midwife

## 2019-10-02 ENCOUNTER — Other Ambulatory Visit: Payer: Self-pay

## 2019-10-02 ENCOUNTER — Encounter: Payer: Self-pay | Admitting: Advanced Practice Midwife

## 2019-10-02 VITALS — BP 125/76 | HR 105 | Wt 247.0 lb

## 2019-10-02 DIAGNOSIS — F1991 Other psychoactive substance use, unspecified, in remission: Secondary | ICD-10-CM

## 2019-10-02 DIAGNOSIS — Z87898 Personal history of other specified conditions: Secondary | ICD-10-CM

## 2019-10-02 DIAGNOSIS — Z3483 Encounter for supervision of other normal pregnancy, third trimester: Secondary | ICD-10-CM

## 2019-10-02 DIAGNOSIS — Z1389 Encounter for screening for other disorder: Secondary | ICD-10-CM

## 2019-10-02 DIAGNOSIS — Z331 Pregnant state, incidental: Secondary | ICD-10-CM

## 2019-10-02 DIAGNOSIS — Z3A35 35 weeks gestation of pregnancy: Secondary | ICD-10-CM

## 2019-10-02 LAB — POCT URINALYSIS DIPSTICK OB
Blood, UA: NEGATIVE
Glucose, UA: NEGATIVE
Ketones, UA: NEGATIVE
Leukocytes, UA: NEGATIVE
Nitrite, UA: NEGATIVE
POC,PROTEIN,UA: NEGATIVE

## 2019-10-02 MED ORDER — CYCLOBENZAPRINE HCL 10 MG PO TABS: 10.0000 mg | ORAL_TABLET | Freq: Three times a day (TID) | ORAL | 1 refills | Status: DC | PRN

## 2019-10-02 NOTE — Progress Notes (Signed)
   LOW-RISK PREGNANCY VISIT Patient name: Loretta Moore MRN [id number]  Date of birth: [birth year] Chief Complaint:   Routine Prenatal Visit (low back pain, back spasms)  History of Present Illness:   Loretta Moore is a 23 y.o. G54P1001 female at [redacted]w[redacted]d with an Estimated Date of Delivery: 11/01/19 being seen today for ongoing management of a low-risk pregnancy.  Today she reports backache. Contractions: Irregular. Vag. Bleeding: None.  Movement: Present. denies leaking of fluid. Review of Systems:   Pertinent items are noted in HPI Denies abnormal vaginal discharge w/ itching/odor/irritation, headaches, visual changes, shortness of breath, chest pain, abdominal pain, severe nausea/vomiting, or problems with urination or bowel movements unless otherwise stated above. Pertinent History Reviewed:  Reviewed past medical,surgical, social, obstetrical and family history.  Reviewed problem list, medications and allergies. Physical Assessment:   Vitals:   10/02/19 1011  BP: 125/76  Pulse: (!) 105  Weight: 247 lb (112 kg)  Body mass index is 46.67 kg/m.        Physical Examination:   General appearance: Well appearing, and in no distress  Mental status: Alert, oriented to person, place, and time  Skin: Warm & dry  Cardiovascular: Normal heart rate noted  Respiratory: Normal respiratory effort, no distress  Abdomen: Soft, gravid, nontender  Pelvic: Cervical exam deferred         Extremities: Edema: None  Fetal Status: Fetal Heart Rate (bpm): 142 Fundal Height: 36 cm Movement: Present    Results for orders placed or performed in visit on 10/02/19 (from the past 24 hour(s))  POC Urinalysis Dipstick OB   Collection Time: 10/02/19 10:12 AM  Result Value Ref Range   Color, UA     Clarity, UA     Glucose, UA Negative Negative   Bilirubin, UA     Ketones, UA neg    Spec Grav, UA     Blood, UA neg    pH, UA     POC,PROTEIN,UA Negative Negative, Trace, Small (1+), Moderate (2+), Large  (3+), 4+   Urobilinogen, UA     Nitrite, UA neg    Leukocytes, UA Negative Negative   Appearance     Odor      Assessment & Plan:  1) Low-risk pregnancy G2P1001 at [redacted]w[redacted]d with an Estimated Date of Delivery: 11/01/19   2) Prev C/S, wants repeat with Dr Elonda Husky- will send note to schedule  3) Back spasms, rx Flexeril to use prn  4) Marijuana use noted at new OB visit, repeat UDS today   Meds:  Meds ordered this encounter  Medications  . cyclobenzaprine (FLEXERIL) 10 MG tablet    Sig: Take 1 tablet (10 mg total) by mouth every 8 (eight) hours as needed for muscle spasms.    Dispense:  30 tablet    Refill:  1   Labs/procedures today: none  Plan:  Continue routine obstetrical care with GBS & GC/chlam at next visit  Reviewed: Preterm labor symptoms and general obstetric precautions including but not limited to vaginal bleeding, contractions, leaking of fluid and fetal movement were reviewed in detail with the patient.  All questions were answered. Has home bp cuff.  Check bp weekly, let us know if >140/90.   Follow-up: Return in about 1 week (around 10/09/2019) for [place] with Dr Elonda Husky, in person.  Orders Placed This Encounter  Procedures  . Pain Management Screening Profile (10S)  . POC Urinalysis Dipstick OB   Myrtis Ser CNM 10/02/2019 10:40 AM

## 2019-10-02 NOTE — Patient Instructions (Signed)
Braxton Hicks Contractions Contractions of the uterus can occur throughout pregnancy, but they are not always a sign that you are in labor. You may have practice contractions called Braxton Hicks contractions. These false labor contractions are sometimes confused with true labor. What are Braxton Hicks contractions? Braxton Hicks contractions are tightening movements that occur in the muscles of the uterus before labor. Unlike true labor contractions, these contractions do not result in opening (dilation) and thinning of the cervix. Toward the end of pregnancy (32-34 weeks), Braxton Hicks contractions can happen more often and may become stronger. These contractions are sometimes difficult to tell apart from true labor because they can be very uncomfortable. You should not feel embarrassed if you go to the hospital with false labor. Sometimes, the only way to tell if you are in true labor is for your health care provider to look for changes in the cervix. The health care provider will do a physical exam and may monitor your contractions. If you are not in true labor, the exam should show that your cervix is not dilating and your water has not broken. If there are no other health problems associated with your pregnancy, it is completely safe for you to be sent home with false labor. You may continue to have Braxton Hicks contractions until you go into true labor. How to tell the difference between true labor and false labor True labor  Contractions last 30-70 seconds.  Contractions become very regular.  Discomfort is usually felt in the top of the uterus, and it spreads to the lower abdomen and low back.  Contractions do not go away with walking.  Contractions usually become more intense and increase in frequency.  The cervix dilates and gets thinner. False labor  Contractions are usually shorter and not as strong as true labor contractions.  Contractions are usually irregular.  Contractions  are often felt in the front of the lower abdomen and in the groin.  Contractions may go away when you walk around or change positions while lying down.  Contractions get weaker and are shorter-lasting as time goes on.  The cervix usually does not dilate or become thin. Follow these instructions at home:   Take over-the-counter and prescription medicines only as told by your health care provider.  Keep up with your usual exercises and follow other instructions from your health care provider.  Eat and drink lightly if you think you are going into labor.  If Braxton Hicks contractions are making you uncomfortable: ? Change your position from lying down or resting to walking, or change from walking to resting. ? Sit and rest in a tub of warm water. ? Drink enough fluid to keep your urine pale yellow. Dehydration may cause these contractions. ? Do slow and deep breathing several times an hour.  Keep all follow-up prenatal visits as told by your health care provider. This is important. Contact a health care provider if:  You have a fever.  You have continuous pain in your abdomen. Get help right away if:  Your contractions become stronger, more regular, and closer together.  You have fluid leaking or gushing from your vagina.  You pass blood-tinged mucus (bloody show).  You have bleeding from your vagina.  You have low back pain that you never had before.  You feel your baby's head pushing down and causing pelvic pressure.  Your baby is not moving inside you as much as it used to. Summary  Contractions that occur before labor are   called Braxton Hicks contractions, false labor, or practice contractions.  Braxton Hicks contractions are usually shorter, weaker, farther apart, and less regular than true labor contractions. True labor contractions usually become progressively stronger and regular, and they become more frequent.  Manage discomfort from Braxton Hicks contractions  by changing position, resting in a warm bath, drinking plenty of water, or practicing deep breathing. This information is not intended to replace advice given to you by your health care provider. Make sure you discuss any questions you have with your health care provider. Document Released: 04/06/2017 Document Revised: 11/03/2017 Document Reviewed: 04/06/2017 Elsevier Patient Education  2020 Else[employer] Inc[employer]  

## 2019-10-03 LAB — PMP SCREEN PROFILE (10S), URINE
Amphetamine Scrn, Ur: NEGATIVE ng/mL
BARBITURATE SCREEN URINE: NEGATIVE ng/mL
BENZODIAZEPINE SCREEN, URINE: NEGATIVE ng/mL
CANNABINOIDS UR QL SCN: POSITIVE ng/mL — AB
Cocaine (Metab) Scrn, Ur: NEGATIVE ng/mL
Creatinine(Crt), U: 15.5 mg/dL — ABNORMAL LOW (ref 20.0–300.0)
Methadone Screen, Urine: NEGATIVE ng/mL
OXYCODONE+OXYMORPHONE UR QL SCN: NEGATIVE ng/mL
Opiate Scrn, Ur: NEGATIVE ng/mL
Ph of Urine: 6.6 (ref 4.5–8.9)
Phencyclidine Qn, Ur: NEGATIVE ng/mL
Propoxyphene Scrn, Ur: NEGATIVE ng/mL

## 2019-10-03 LAB — SPECIFIC GRAVITY (REFLEXED): SPECIFIC GRAVITY: 1.002

## 2019-10-03 LAB — SPECIMEN STATUS REPORT

## 2019-10-04 ENCOUNTER — Encounter: Payer: Medicaid Other | Admitting: Obstetrics & Gynecology

## 2019-10-11 ENCOUNTER — Encounter: Payer: Self-pay | Admitting: Obstetrics & Gynecology

## 2019-10-11 ENCOUNTER — Other Ambulatory Visit (HOSPITAL_COMMUNITY)
Admission: RE | Admit: 2019-10-11 | Discharge: 2019-10-11 | Disposition: A | Discharge status: Home / Self Care | Payer: Medicaid Other | Source: Ambulatory Visit | Attending: Obstetrics & Gynecology | Admitting: Obstetrics & Gynecology

## 2019-10-11 ENCOUNTER — Ambulatory Visit (INDEPENDENT_AMBULATORY_CARE_PROVIDER_SITE_OTHER): Payer: Medicaid Other | Admitting: Obstetrics & Gynecology

## 2019-10-11 ENCOUNTER — Other Ambulatory Visit: Payer: Self-pay

## 2019-10-11 VITALS — BP 131/73 | HR 94 | Wt 246.0 lb

## 2019-10-11 DIAGNOSIS — Z1389 Encounter for screening for other disorder: Secondary | ICD-10-CM

## 2019-10-11 DIAGNOSIS — Z3A37 37 weeks gestation of pregnancy: Secondary | ICD-10-CM

## 2019-10-11 DIAGNOSIS — Z3483 Encounter for supervision of other normal pregnancy, third trimester: Secondary | ICD-10-CM | POA: Insufficient documentation

## 2019-10-11 DIAGNOSIS — Z331 Pregnant state, incidental: Secondary | ICD-10-CM

## 2019-10-11 LAB — POCT URINALYSIS DIPSTICK OB
Blood, UA: NEGATIVE
Glucose, UA: NEGATIVE
Ketones, UA: NEGATIVE
Nitrite, UA: NEGATIVE
POC,PROTEIN,UA: NEGATIVE

## 2019-10-11 NOTE — Progress Notes (Signed)
   LOW-RISK PREGNANCY VISIT Patient name: Loretta Moore MRN [id number]  Date of birth: [birth year] Chief Complaint:   Routine Prenatal Visit (GBS; GC/CHL)  History of Present Illness:   Loretta Moore is a 23 y.o. G23P1001 female at [redacted]w[redacted]d with an Estimated Date of Delivery: 11/01/19 being seen today for ongoing management of a low-risk pregnancy.  Today she reports no complaints. Contractions: Not present. Vag. Bleeding: None.  Movement: Present. denies leaking of fluid. Review of Systems:   Pertinent items are noted in HPI Denies abnormal vaginal discharge w/ itching/odor/irritation, headaches, visual changes, shortness of breath, chest pain, abdominal pain, severe nausea/vomiting, or problems with urination or bowel movements unless otherwise stated above. Pertinent History Reviewed:  Reviewed past medical,surgical, social, obstetrical and family history.  Reviewed problem list, medications and allergies. Physical Assessment:   Vitals:   10/11/19 1120  BP: 131/73  Pulse: 94  Weight: 246 lb (111.6 kg)  Body mass index is 46.48 kg/m.        Physical Examination:   General appearance: Well appearing, and in no distress  Mental status: Alert, oriented to person, place, and time  Skin: Warm & dry  Cardiovascular: Normal heart rate noted  Respiratory: Normal respiratory effort, no distress  Abdomen: Soft, gravid, nontender  Pelvic: Cervical exam performed         Extremities: Edema: Trace  Fetal Status:     Movement: Present    Chaperone: Levy Pupa    Results for orders placed or performed in visit on 10/11/19 (from the past 24 hour(s))  POC Urinalysis Dipstick OB   Collection Time: 10/11/19 11:21 AM  Result Value Ref Range   Color, UA     Clarity, UA     Glucose, UA Negative Negative   Bilirubin, UA     Ketones, UA neg    Spec Grav, UA     Blood, UA neg    pH, UA     POC,PROTEIN,UA Negative Negative, Trace, Small (1+), Moderate (2+), Large (3+), 4+   Urobilinogen, UA      Nitrite, UA neg    Leukocytes, UA Trace (A) Negative   Appearance     Odor      Assessment & Plan:  1) Low-risk pregnancy G2P1001 at [redacted]w[redacted]d with an Estimated Date of Delivery: 11/01/19   2) Previous C section, 10/26/2019 @ 0730   Meds: No orders of the defined types were placed in this encounter.  Labs/procedures today:   Plan:  Continue routine obstetrical care  Next visit: prefers in person    Reviewed: Term labor symptoms and general obstetric precautions including but not limited to vaginal bleeding, contractions, leaking of fluid and fetal movement were reviewed in detail with the patient.  All questions were answered. Has home bp cuff. Rx faxed to . Check bp weekly, let us know if >140/90.   Follow-up: No follow-ups on file.  Orders Placed This Encounter  Procedures  . Strep Gp B NAA+Rflx  . POC Urinalysis Dipstick OB   Mertie Clause Lovelle Lema 10/11/2019 11:45 AM

## 2019-10-15 LAB — STREP GP B NAA+RFLX: Strep Gp B NAA+Rflx: POSITIVE — AB

## 2019-10-15 LAB — CERVICOVAGINAL ANCILLARY ONLY
Chlamydia: NEGATIVE
Comment: NEGATIVE
Comment: NORMAL
Neisseria Gonorrhea: NEGATIVE

## 2019-10-15 LAB — STREP GP B SUSCEPTIBILITY

## 2019-10-18 ENCOUNTER — Telehealth (HOSPITAL_COMMUNITY): Payer: Self-pay | Admitting: *Deleted

## 2019-10-18 ENCOUNTER — Encounter: Payer: Self-pay | Admitting: Women's Health

## 2019-10-18 ENCOUNTER — Other Ambulatory Visit: Payer: Self-pay

## 2019-10-18 ENCOUNTER — Ambulatory Visit (INDEPENDENT_AMBULATORY_CARE_PROVIDER_SITE_OTHER): Payer: Medicaid Other | Admitting: Women's Health

## 2019-10-18 VITALS — BP 122/84 | HR 90 | Wt 249.0 lb

## 2019-10-18 DIAGNOSIS — Z3A38 38 weeks gestation of pregnancy: Secondary | ICD-10-CM

## 2019-10-18 DIAGNOSIS — Z3483 Encounter for supervision of other normal pregnancy, third trimester: Secondary | ICD-10-CM

## 2019-10-18 DIAGNOSIS — Z331 Pregnant state, incidental: Secondary | ICD-10-CM

## 2019-10-18 DIAGNOSIS — Z1389 Encounter for screening for other disorder: Secondary | ICD-10-CM

## 2019-10-18 LAB — POCT URINALYSIS DIPSTICK OB
Blood, UA: NEGATIVE
Glucose, UA: NEGATIVE
Ketones, UA: NEGATIVE
Leukocytes, UA: NEGATIVE
Nitrite, UA: NEGATIVE
POC,PROTEIN,UA: NEGATIVE

## 2019-10-18 NOTE — Patient Instructions (Signed)
Loretta Moore  10/18/2019   Your procedure is scheduled on:  10/26/2019  Arrive at [place] at Entrance C on [address] at [facility]  and [address]. You are invited to use the FREE valet parking or use the Visitor's parking deck.  Pick up the phone at the desk and dial [phone].  Call this number if you have problems the morning of surgery: [phone]  Remember:   Do not eat food:(After Midnight) Desps de medianoche.  Do not drink clear liquids: (After Midnight) Desps de medianoche.  Take these medicines the morning of surgery with A SIP OF WATER:  Bring your inhaler with you   Do not wear jewelry, make-up or nail polish.  Do not wear lotions, powders, or perfumes. Do not wear deodorant.  Do not shave 48 hours prior to surgery.  Do not bring valuables to the hospital.  [facility] is not   responsible for any belongings or valuables brought to the hospital.  Contacts, dentures or bridgework may not be worn into surgery.  Leave suitcase in the car. After surgery it may be brought to your room.  For patients admitted to the hospital, checkout time is 11:00 AM the day of              discharge.      Please read over the following fact sheets that you were given:     Preparing for Surgery

## 2019-10-18 NOTE — Patient Instructions (Signed)
Loretta Moore, I greatly value your feedback.  If you receive a survey following your visit with [place] today, we appreciate you taking the time to fill it out.  Thanks, Loretta Moore, CNM, [facility]  [place]!!! It is now [place] at [facility] ([place], [place] [address]) Entrance located off of [address] parking   Go to [email or web address] to register for FREE online childbirth classes    Call the office [phone]) or go to [facility] if:  You begin to have strong, frequent contractions  Your water breaks.  Sometimes it is a big gush of fluid, sometimes it is just a trickle that keeps getting your panties wet or running down your legs  You have vaginal bleeding.  It is normal to have a small amount of spotting if your cervix was checked.   You don't feel your baby moving like normal.  If you don't, get you something to eat and drink and lay down and focus on feeling your baby move.  You should feel at least 10 movements in 2 hours.  If you don't, you should call the office or go to [facility] Blood Pressure Monitoring for Patients   Your provider has recommended that you check your blood pressure (BP) at least once a week at home. If you do not have a blood pressure cuff at home, one will be provided for you. Contact your provider if you have not received your monitor within 1 week.   Helpful Tips for Accurate Home Blood Pressure Checks   Don't smoke, exercise, or drink caffeine 30 minutes before checking your BP  Use the restroom before checking your BP (a full bladder can raise your pressure)  Relax in a comfortable upright chair  Feet on the ground  Left arm resting comfortably on a flat surface at the level of your heart  Legs uncrossed  Back supported  Sit quietly and don't talk  Place the cuff on your bare arm  Adjust snuggly, so that only two fingertips can fit between your skin and  the top of the cuff  Check 2 readings separated by at least one minute  Keep a log of your BP readings  For a visual, please reference this diagram: http://ccnc.care/bpdiagram  Provider Name: Loretta Moore     Phone: [phone]  Zone 1: ALL CLEAR  Continue to monitor your symptoms:   BP reading is less than 140 (top number) or less than 90 (bottom number)   No right upper stomach pain  No headaches or seeing spots  No feeling nauseated or throwing up  No swelling in face and hands  Zone 2: CAUTION Call your doctor's office for any of the following:   BP reading is greater than 140 (top number) or greater than 90 (bottom number)   Stomach pain under your ribs in the middle or right side  Headaches or seeing spots  Feeling nauseated or throwing up  Swelling in face and hands  Zone 3: EMERGENCY  Seek immediate medical care if you have any of the following:   BP reading is greater than160 (top number) or greater than 110 (bottom number)  Severe headaches not improving with Tylenol  Serious difficulty catching your breath  Any worsening symptoms from Zone 2    Braxton Hicks Contractions Contractions of the uterus can occur throughout pregnancy, but they are not always a sign that you are in  labor. You may have practice contractions called Braxton Hicks contractions. These false labor contractions are sometimes confused with true labor. What are Loretta Moore contractions? Braxton Hicks contractions are tightening movements that occur in the muscles of the uterus before labor. Unlike true labor contractions, these contractions do not result in opening (dilation) and thinning of the cervix. Toward the end of pregnancy (32-34 weeks), Braxton Hicks contractions can happen more often and may become stronger. These contractions are sometimes difficult to tell apart from true labor because they can be very uncomfortable. You should not feel embarrassed if you go to the  hospital with false labor. Sometimes, the only way to tell if you are in true labor is for your health care provider to look for changes in the cervix. The health care provider will do a physical exam and may monitor your contractions. If you are not in true labor, the exam should show that your cervix is not dilating and your water has not broken. If there are no other health problems associated with your pregnancy, it is completely safe for you to be sent home with false labor. You may continue to have Braxton Hicks contractions until you go into true labor. How to tell the difference between true labor and false labor True labor  Contractions last 30-70 seconds.  Contractions become very regular.  Discomfort is usually felt in the top of the uterus, and it spreads to the lower abdomen and low back.  Contractions do not go away with walking.  Contractions usually become more intense and increase in frequency.  The cervix dilates and gets thinner. False labor  Contractions are usually shorter and not as strong as true labor contractions.  Contractions are usually irregular.  Contractions are often felt in the front of the lower abdomen and in the groin.  Contractions may go away when you walk around or change positions while lying down.  Contractions get weaker and are shorter-lasting as time goes on.  The cervix usually does not dilate or become thin. Follow these instructions at home:   Take over-the-counter and prescription medicines only as told by your health care provider.  Keep up with your usual exercises and follow other instructions from your health care provider.  Eat and drink lightly if you think you are going into labor.  If Braxton Hicks contractions are making you uncomfortable: ? Change your position from lying down or resting to walking, or change from walking to resting. ? Sit and rest in a tub of warm water. ? Drink enough fluid to keep your urine pale  yellow. Dehydration may cause these contractions. ? Do slow and deep breathing several times an hour.  Keep all follow-up prenatal visits as told by your health care provider. This is important. Contact a health care provider if:  You have a fever.  You have continuous pain in your abdomen. Get help right away if:  Your contractions become stronger, more regular, and closer together.  You have fluid leaking or gushing from your vagina.  You pass blood-tinged mucus (bloody show).  You have bleeding from your vagina.  You have low back pain that you never had before.  You feel your babys head pushing down and causing pelvic pressure.  Your baby is not moving inside you as much as it used to. Summary  Contractions that occur before labor are called Braxton Hicks contractions, false labor, or practice contractions.  Braxton Hicks contractions are usually shorter, weaker, farther apart, and less  regular than true labor contractions. True labor contractions usually become progressively stronger and regular, and they become more frequent.  Manage discomfort from [facility] contractions by changing position, resting in a warm bath, drinking plenty of water, or practicing deep breathing. This information is not intended to replace advice given to you by your health care provider. Make sure you discuss any questions you have with your health care provider. Document Released: 04/06/2017 Document Revised: 11/03/2017 Document Reviewed: 04/06/2017 Elsevier Patient Education  2020 [employer].

## 2019-10-18 NOTE — Progress Notes (Signed)
   LOW-RISK PREGNANCY VISIT Patient name: Loretta Moore MRN [id number]  Date of birth: Jan 01, 1996 Chief Complaint:   Routine Prenatal Visit  History of Present Illness:   Loretta Moore is a 23 y.o. G65P1001 female at [redacted]w[redacted]d with an Estimated Date of Delivery: 11/01/19 being seen today for ongoing management of a low-risk pregnancy.  Today she reports no complaints. Contractions: Not present. Vag. Bleeding: None.  Movement: Present. denies leaking of fluid. Review of Systems:   Pertinent items are noted in HPI Denies abnormal vaginal discharge w/ itching/odor/irritation, headaches, visual changes, shortness of breath, chest pain, abdominal pain, severe nausea/vomiting, or problems with urination or bowel movements unless otherwise stated above. Pertinent History Reviewed:  Reviewed past medical,surgical, social, obstetrical and family history.  Reviewed problem list, medications and allergies. Physical Assessment:   Vitals:   10/18/19 1142  BP: 122/84  Pulse: 90  Weight: 249 lb (112.9 kg)  Body mass index is 47.05 kg/m.        Physical Examination:   General appearance: Well appearing, and in no distress  Mental status: Alert, oriented to person, place, and time  Skin: Warm & dry  Cardiovascular: Normal heart rate noted  Respiratory: Normal respiratory effort, no distress  Abdomen: Soft, gravid, nontender  Pelvic: Cervical exam performed  Dilation: Closed Effacement (%): Thick Station: -3  Extremities: Edema: Trace  Fetal Status: Fetal Heart Rate (bpm): 142 Fundal Height: 40 cm Movement: Present Presentation: Vertex  Chaperone: Peggy Dones    Results for orders placed or performed in visit on 10/18/19 (from the past 24 hour(s))  POC Urinalysis Dipstick OB   Collection Time: 10/18/19 11:41 AM  Result Value Ref Range   Color, UA     Clarity, UA     Glucose, UA Negative Negative   Bilirubin, UA     Ketones, UA neg    Spec Grav, UA     Blood, UA neg    pH, UA     POC,PROTEIN,UA Negative Negative, Trace, Small (1+), Moderate (2+), Large (3+), 4+   Urobilinogen, UA     Nitrite, UA neg    Leukocytes, UA Negative Negative   Appearance     Odor      Assessment & Plan:  1) Low-risk pregnancy G2P1001 at [redacted]w[redacted]d with an Estimated Date of Delivery: 11/01/19   2) Prev c/s, for RCS 11/21   Meds: No orders of the defined types were placed in this encounter.  Labs/procedures today: sve  Plan:  Continue routine obstetrical care  Next visit: prefers in person    Reviewed: Term labor symptoms and general obstetric precautions including but not limited to vaginal bleeding, contractions, leaking of fluid and fetal movement were reviewed in detail with the patient.  All questions were answered.   Follow-up: Return for Wed for , LROB, in person, MD or CNM.  Orders Placed This Encounter  Procedures  . POC Urinalysis Dipstick OB   Roma Schanz CNM, [place] 10/18/2019 12:19 PM

## 2019-10-18 NOTE — Telephone Encounter (Signed)
Preadmission screen  

## 2019-10-20 ENCOUNTER — Other Ambulatory Visit: Payer: Self-pay

## 2019-10-20 ENCOUNTER — Inpatient Hospital Stay (HOSPITAL_COMMUNITY)
Admission: AD | Admit: 2019-10-20 | Discharge: 2019-10-20 | Disposition: A | Discharge status: Home / Self Care | Payer: Medicaid Other | Attending: Obstetrics & Gynecology | Admitting: Obstetrics & Gynecology

## 2019-10-20 ENCOUNTER — Encounter (HOSPITAL_COMMUNITY): Payer: Self-pay

## 2019-10-20 DIAGNOSIS — Z3A38 38 weeks gestation of pregnancy: Secondary | ICD-10-CM | POA: Diagnosis not present

## 2019-10-20 DIAGNOSIS — O471 False labor at or after 37 completed weeks of gestation: Secondary | ICD-10-CM | POA: Insufficient documentation

## 2019-10-20 DIAGNOSIS — Z0371 Encounter for suspected problem with amniotic cavity and membrane ruled out: Secondary | ICD-10-CM | POA: Diagnosis not present

## 2019-10-20 NOTE — MAU Note (Addendum)
Pt here because she thinks water broke at 0300. She reports that she was sitting in the chair and felt like the baby kicked and a she had water come out. Reports her underwear felt more wet. Reports she has had extra, clear discharge. Is not having to wear a pad. Pt denies vaginal bleeding. Reports she is having braxton hicks contractions irregularly. Reports good fetal movement. Is scheduled for a rLTCS on 10/26/2019.

## 2019-10-20 NOTE — Discharge Instructions (Signed)

## 2019-10-20 NOTE — MAU Provider Note (Signed)
First Provider Initiated Contact with Patient 10/20/19 2247     S: Ms. Loretta Moore is a 23 y.o. G2P1001 at [redacted]w[redacted]d  who presents to MAU today complaining of leaking of fluid since 0300 on 10/20/2019. She denies vaginal bleeding. She denies contractions. She reports normal fetal movement.  Her pain score is 0/10. She is planning an elective repeat cesarean section on 10/26/19. She is requesting her section be rescheduled to this evening.  O: BP 138/77   Pulse (!) 119   Temp 98.1 F (36.7 C) (Oral)   Resp 18   Ht 5' (1.524 m)   Wt 113.1 kg   LMP 01/25/2019 (Exact Date)   SpO2 99%   BMI 48.69 kg/m  GENERAL: Well-developed, well-nourished female in no acute distress.  HEAD: Normocephalic, atraumatic.  CHEST: Normal effort of breathing, regular heart rate ABDOMEN: Soft, nontender, gravid PELVIC: Normal external female genitalia. Vagina is pink and rugated. Cervix with normal contour, no lesions. Normal discharge.  No pooling.   Cervical exam: FT/long/ballotable  Fetal Monitoring: Baseline: 145 Variability: Mod Accelerations: 15 x 15 Decelerations: None Contractions: Rare   A: SIUP at [redacted]w[redacted]d  Membranes intact (negative pooling, negative fern)   P: Discharge home in stable condition   F/U: [facility] 10/23/19  Darlina Rumpf, CNM 10/21/2019 12:26 AM

## 2019-10-21 ENCOUNTER — Encounter (HOSPITAL_COMMUNITY): Payer: Self-pay

## 2019-10-22 ENCOUNTER — Other Ambulatory Visit: Payer: Self-pay | Admitting: Obstetrics & Gynecology

## 2019-10-23 ENCOUNTER — Other Ambulatory Visit: Payer: Self-pay

## 2019-10-23 ENCOUNTER — Ambulatory Visit (INDEPENDENT_AMBULATORY_CARE_PROVIDER_SITE_OTHER): Payer: Medicaid Other | Admitting: Advanced Practice Midwife

## 2019-10-23 VITALS — BP 126/77 | HR 89 | Wt 245.0 lb

## 2019-10-23 DIAGNOSIS — Z1389 Encounter for screening for other disorder: Secondary | ICD-10-CM

## 2019-10-23 DIAGNOSIS — Z3483 Encounter for supervision of other normal pregnancy, third trimester: Secondary | ICD-10-CM

## 2019-10-23 DIAGNOSIS — Z98891 History of uterine scar from previous surgery: Secondary | ICD-10-CM

## 2019-10-23 DIAGNOSIS — Z3A38 38 weeks gestation of pregnancy: Secondary | ICD-10-CM

## 2019-10-23 DIAGNOSIS — Z331 Pregnant state, incidental: Secondary | ICD-10-CM

## 2019-10-23 LAB — POCT URINALYSIS DIPSTICK OB
Blood, UA: NEGATIVE
Glucose, UA: NEGATIVE
Ketones, UA: NEGATIVE
Leukocytes, UA: NEGATIVE
Nitrite, UA: NEGATIVE
POC,PROTEIN,UA: NEGATIVE

## 2019-10-23 NOTE — Progress Notes (Signed)
   LOW-RISK PREGNANCY VISIT Patient name: Loretta Moore MRN [id number]  Date of birth: 1996-01-04 Chief Complaint:   Routine Prenatal Visit  History of Present Illness:   Loretta Moore is a 23 y.o. G6P1001 female at [redacted]w[redacted]d with an Estimated Date of Delivery: 11/01/19 being seen today for ongoing management of a low-risk pregnancy.  Today she reports no complaints. Contractions: Irregular. Vag. Bleeding: None.  Movement: Present. denies leaking of fluid.  Concerned re areas on her sides where she had surgery for HS and how the recovery process might go- will send a note for LHE as he is her surgeon for rLTCS on 10/26/19.  Review of Systems:   Pertinent items are noted in HPI Denies abnormal vaginal discharge w/ itching/odor/irritation, headaches, visual changes, shortness of breath, chest pain, abdominal pain, severe nausea/vomiting, or problems with urination or bowel movements unless otherwise stated above. Pertinent History Reviewed:  Reviewed past medical,surgical, social, obstetrical and family history.  Reviewed problem list, medications and allergies. Physical Assessment:   Vitals:   10/23/19 1521  BP: 126/77  Pulse: 89  Weight: 245 lb (111.1 kg)  Body mass index is 47.85 kg/m.        Physical Examination:   General appearance: Well appearing, and in no distress  Mental status: Alert, oriented to person, place, and time  Skin: Warm & dry  Cardiovascular: Normal heart rate noted  Respiratory: Normal respiratory effort, no distress  Abdomen: Soft, gravid, nontender  Pelvic: Cervical exam deferred         Extremities:    Fetal Status: Fetal Heart Rate (bpm): 140 Fundal Height: 39 cm Movement: Present    Results for orders placed or performed in visit on 10/23/19 (from the past 24 hour(s))  POC Urinalysis Dipstick OB   Collection Time: 10/23/19  3:20 PM  Result Value Ref Range   Color, UA     Clarity, UA     Glucose, UA Negative Negative   Bilirubin, UA     Ketones,  UA n    Spec Grav, UA     Blood, UA n    pH, UA     POC,PROTEIN,UA Negative Negative, Trace, Small (1+), Moderate (2+), Large (3+), 4+   Urobilinogen, UA     Nitrite, UA n    Leukocytes, UA Negative Negative   Appearance     Odor      Assessment & Plan:  1) Low-risk pregnancy G2P1001 at [redacted]w[redacted]d with an Estimated Date of Delivery: 11/01/19   2) Previous C/S> for repeat 10/26/19  3) Hidradenitis suppurativa> concerns re scars on side and how they may be affected by pregnancy/surgery   Meds: No orders of the defined types were placed in this encounter.  Labs/procedures today: none  Plan:  Continue routine obstetrical care, and plan made for PP visits   Reviewed: Term labor symptoms and general obstetric precautions including but not limited to vaginal bleeding, contractions, leaking of fluid and fetal movement were reviewed in detail with the patient.  All questions were answered.    Follow-up: Return for 1-2 wk incision check; 4 wk PP visit w/ LHE.  Orders Placed This Encounter  Procedures  . POC Urinalysis Dipstick OB   Myrtis Ser [facility] 10/23/2019 3:59 PM

## 2019-10-24 ENCOUNTER — Other Ambulatory Visit (HOSPITAL_COMMUNITY)
Admission: RE | Admit: 2019-10-24 | Discharge: 2019-10-24 | Disposition: A | Discharge status: Home / Self Care | Payer: Medicaid Other | Source: Ambulatory Visit | Attending: Obstetrics & Gynecology | Admitting: Obstetrics & Gynecology

## 2019-10-24 DIAGNOSIS — Z01812 Encounter for preprocedural laboratory examination: Secondary | ICD-10-CM | POA: Diagnosis present

## 2019-10-24 DIAGNOSIS — Z20828 Contact with and (suspected) exposure to other viral communicable diseases: Secondary | ICD-10-CM | POA: Diagnosis not present

## 2019-10-24 LAB — CBC
HCT: 36.3 % (ref 36.0–46.0)
Hemoglobin: 12.2 g/dL (ref 12.0–15.0)
MCH: 30.1 pg (ref 26.0–34.0)
MCHC: 33.6 g/dL (ref 30.0–36.0)
MCV: 89.6 fL (ref 80.0–100.0)
Platelets: 273 10*3/uL (ref 150–400)
RBC: 4.05 MIL/uL (ref 3.87–5.11)
RDW: 13.4 % (ref 11.5–15.5)
WBC: 6.1 10*3/uL (ref 4.0–10.5)
nRBC: 0 % (ref 0.0–0.2)

## 2019-10-24 LAB — SARS CORONAVIRUS 2 (TAT 6-24 HRS): SARS Coronavirus 2: NEGATIVE

## 2019-10-24 LAB — TYPE AND SCREEN
ABO/RH(D): O POS
Antibody Screen: NEGATIVE

## 2019-10-24 LAB — ABO/RH: ABO/RH(D): O POS

## 2019-10-24 LAB — RPR: RPR Ser Ql: NONREACTIVE

## 2019-10-24 NOTE — MAU Note (Signed)
Asymptomatic, swab collected. Lab called 

## 2019-10-25 ENCOUNTER — Encounter (HOSPITAL_COMMUNITY): Payer: Self-pay | Admitting: Anesthesiology

## 2019-10-25 NOTE — Anesthesia Preprocedure Evaluation (Addendum)
Anesthesia Evaluation  Patient identified by MRN, date of birth, ID band Patient awake    Reviewed: Allergy & Precautions, NPO status , Patient's Chart, lab work & pertinent test results  Airway Mallampati: III  TM Distance: >3 FB Neck ROM: Full    Dental  (+) Teeth Intact   Pulmonary asthma , Current Smoker,    Pulmonary exam normal breath sounds clear to auscultation       Cardiovascular negative cardio ROS Normal cardiovascular exam Rhythm:Regular Rate:Normal     Neuro/Psych negative neurological ROS  negative psych ROS   GI/Hepatic GERD  ,(+)     substance abuse  marijuana use,   Endo/Other  Morbid obesity  Renal/GU negative Renal ROS  negative genitourinary   Musculoskeletal   Abdominal (+) + obese,   Peds  Hematology  (+) anemia ,   Anesthesia Other Findings   Reproductive/Obstetrics (+) Pregnancy Previous C/Section                            Anesthesia Physical Anesthesia Plan  ASA: III  Anesthesia Plan: Spinal   Post-op Pain Management:    Induction:   PONV Risk Score and Plan: 4 or greater and Scopolamine patch - Pre-op, Ondansetron and Treatment may vary due to age or medical condition  Airway Management Planned: Natural Airway  Additional Equipment:   Intra-op Plan:   Post-operative Plan:   Informed Consent: I have reviewed the patients History and Physical, chart, labs and discussed the procedure including the risks, benefits and alternatives for the proposed anesthesia with the patient or authorized representative who has indicated his/her understanding and acceptance.     Dental advisory given  Plan Discussed with: CRNA and Surgeon  Anesthesia Plan Comments:        Anesthesia Quick Evaluation

## 2019-10-26 ENCOUNTER — Inpatient Hospital Stay (HOSPITAL_COMMUNITY)
Admission: RE | Admit: 2019-10-26 | Discharge: 2019-10-28 | DRG: 788 | Disposition: A | Discharge status: Home / Self Care | Payer: Medicaid Other | Attending: Obstetrics & Gynecology | Admitting: Obstetrics & Gynecology

## 2019-10-26 ENCOUNTER — Encounter (HOSPITAL_COMMUNITY)
Admission: RE | Disposition: A | Discharge status: Home / Self Care | Payer: Self-pay | Source: Home / Self Care | Attending: Obstetrics & Gynecology

## 2019-10-26 ENCOUNTER — Inpatient Hospital Stay (HOSPITAL_COMMUNITY): Payer: Medicaid Other | Admitting: Anesthesiology

## 2019-10-26 ENCOUNTER — Encounter (HOSPITAL_COMMUNITY): Payer: Self-pay

## 2019-10-26 ENCOUNTER — Other Ambulatory Visit: Payer: Self-pay

## 2019-10-26 DIAGNOSIS — O34211 Maternal care for low transverse scar from previous cesarean delivery: Secondary | ICD-10-CM | POA: Diagnosis not present

## 2019-10-26 DIAGNOSIS — Z3A39 39 weeks gestation of pregnancy: Secondary | ICD-10-CM

## 2019-10-26 DIAGNOSIS — O99334 Smoking (tobacco) complicating childbirth: Secondary | ICD-10-CM | POA: Diagnosis present

## 2019-10-26 DIAGNOSIS — O9952 Diseases of the respiratory system complicating childbirth: Secondary | ICD-10-CM | POA: Diagnosis present

## 2019-10-26 DIAGNOSIS — O99824 Streptococcus B carrier state complicating childbirth: Secondary | ICD-10-CM | POA: Diagnosis present

## 2019-10-26 DIAGNOSIS — J45909 Unspecified asthma, uncomplicated: Secondary | ICD-10-CM | POA: Diagnosis present

## 2019-10-26 DIAGNOSIS — Z20828 Contact with and (suspected) exposure to other viral communicable diseases: Secondary | ICD-10-CM | POA: Diagnosis present

## 2019-10-26 DIAGNOSIS — Z880 Allergy status to penicillin: Secondary | ICD-10-CM | POA: Diagnosis not present

## 2019-10-26 DIAGNOSIS — Z23 Encounter for immunization: Secondary | ICD-10-CM

## 2019-10-26 DIAGNOSIS — Z98891 History of uterine scar from previous surgery: Secondary | ICD-10-CM

## 2019-10-26 DIAGNOSIS — F17210 Nicotine dependence, cigarettes, uncomplicated: Secondary | ICD-10-CM | POA: Diagnosis present

## 2019-10-26 DIAGNOSIS — O99214 Obesity complicating childbirth: Secondary | ICD-10-CM | POA: Diagnosis present

## 2019-10-26 DIAGNOSIS — O9902 Anemia complicating childbirth: Secondary | ICD-10-CM | POA: Diagnosis present

## 2019-10-26 DIAGNOSIS — D649 Anemia, unspecified: Secondary | ICD-10-CM | POA: Diagnosis present

## 2019-10-26 DIAGNOSIS — Z3490 Encounter for supervision of normal pregnancy, unspecified, unspecified trimester: Secondary | ICD-10-CM

## 2019-10-26 DIAGNOSIS — L732 Hidradenitis suppurativa: Secondary | ICD-10-CM | POA: Diagnosis present

## 2019-10-26 SURGERY — Surgical Case
Anesthesia: Spinal | Site: Abdomen

## 2019-10-26 MED ORDER — IBUPROFEN 800 MG PO TABS
800.0000 mg | ORAL_TABLET | Freq: Three times a day (TID) | ORAL | Status: DC
  Administered 2019-10-27 – 2019-10-28 (×3): 800 mg via ORAL
  Filled 2019-10-26 (×3): qty 1

## 2019-10-26 MED ORDER — STERILE WATER FOR IRRIGATION IR SOLN
Status: DC | PRN
  Administered 2019-10-26: 1

## 2019-10-26 MED ORDER — CLINDAMYCIN PHOSPHATE 900 MG/50ML IV SOLN
900.0000 mg | INTRAVENOUS | Status: AC
  Administered 2019-10-26: 08:00:00 900 mg via INTRAVENOUS

## 2019-10-26 MED ORDER — ONDANSETRON HCL 4 MG/2ML IJ SOLN
INTRAMUSCULAR | Status: AC
  Filled 2019-10-26: qty 2

## 2019-10-26 MED ORDER — FENTANYL CITRATE (PF) 100 MCG/2ML IJ SOLN: 25.0000 ug | INTRAMUSCULAR | Status: DC | PRN

## 2019-10-26 MED ORDER — SENNOSIDES-DOCUSATE SODIUM 8.6-50 MG PO TABS
2.0000 | ORAL_TABLET | ORAL | Status: DC
  Administered 2019-10-26 – 2019-10-27 (×2): 2 via ORAL
  Filled 2019-10-26 (×2): qty 2

## 2019-10-26 MED ORDER — SODIUM CHLORIDE 0.9 % IR SOLN
Status: DC | PRN
  Administered 2019-10-26: 1

## 2019-10-26 MED ORDER — CLINDAMYCIN PHOSPHATE 900 MG/50ML IV SOLN
INTRAVENOUS | Status: AC
  Filled 2019-10-26: qty 50

## 2019-10-26 MED ORDER — DIBUCAINE (PERIANAL) 1 % EX OINT: 1.0000 "application " | TOPICAL_OINTMENT | CUTANEOUS | Status: DC | PRN

## 2019-10-26 MED ORDER — SIMETHICONE 80 MG PO CHEW
80.0000 mg | CHEWABLE_TABLET | Freq: Three times a day (TID) | ORAL | Status: DC
  Administered 2019-10-26 – 2019-10-28 (×4): 80 mg via ORAL
  Filled 2019-10-26 (×5): qty 1

## 2019-10-26 MED ORDER — SIMETHICONE 80 MG PO CHEW: 80.0000 mg | CHEWABLE_TABLET | ORAL | Status: DC | PRN

## 2019-10-26 MED ORDER — SODIUM CHLORIDE 0.9 % IV SOLN
INTRAVENOUS | Status: DC | PRN
  Administered 2019-10-26: 08:00:00 via INTRAVENOUS

## 2019-10-26 MED ORDER — MORPHINE SULFATE (PF) 0.5 MG/ML IJ SOLN
INTRAMUSCULAR | Status: DC | PRN
  Administered 2019-10-26: .15 mg via INTRATHECAL

## 2019-10-26 MED ORDER — MENTHOL 3 MG MT LOZG: 1.0000 | LOZENGE | OROMUCOSAL | Status: DC | PRN

## 2019-10-26 MED ORDER — MORPHINE SULFATE (PF) 0.5 MG/ML IJ SOLN
INTRAMUSCULAR | Status: AC
  Filled 2019-10-26: qty 10

## 2019-10-26 MED ORDER — PNEUMOCOCCAL VAC POLYVALENT 25 MCG/0.5ML IJ INJ
0.5000 mL | INJECTION | INTRAMUSCULAR | Status: AC
  Administered 2019-10-28: 0.5 mL via INTRAMUSCULAR
  Filled 2019-10-26: qty 0.5

## 2019-10-26 MED ORDER — LACTATED RINGERS IV SOLN
INTRAVENOUS | Status: DC | PRN
  Administered 2019-10-26 (×2): via INTRAVENOUS

## 2019-10-26 MED ORDER — OXYTOCIN 40 UNITS IN NORMAL SALINE INFUSION - SIMPLE MED: 2.5000 [IU]/h | INTRAVENOUS | Status: AC

## 2019-10-26 MED ORDER — PRENATAL MULTIVITAMIN CH
1.0000 | ORAL_TABLET | Freq: Every day | ORAL | Status: DC
  Administered 2019-10-27 – 2019-10-28 (×2): 1 via ORAL
  Filled 2019-10-26 (×2): qty 1

## 2019-10-26 MED ORDER — BUPIVACAINE IN DEXTROSE 0.75-8.25 % IT SOLN
INTRATHECAL | Status: DC | PRN
  Administered 2019-10-26: 1.4 mL via INTRATHECAL

## 2019-10-26 MED ORDER — ONDANSETRON HCL 4 MG/2ML IJ SOLN
INTRAMUSCULAR | Status: DC | PRN
  Administered 2019-10-26: 4 mg via INTRAVENOUS

## 2019-10-26 MED ORDER — LACTATED RINGERS IV SOLN
INTRAVENOUS | Status: DC
  Administered 2019-10-26: 18:00:00 via INTRAVENOUS

## 2019-10-26 MED ORDER — FENTANYL CITRATE (PF) 100 MCG/2ML IJ SOLN
INTRAMUSCULAR | Status: DC | PRN
  Administered 2019-10-26: 15 ug via INTRATHECAL

## 2019-10-26 MED ORDER — PHENYLEPHRINE HCL-NACL 20-0.9 MG/250ML-% IV SOLN
INTRAVENOUS | Status: DC | PRN
  Administered 2019-10-26: 60 ug/min via INTRAVENOUS

## 2019-10-26 MED ORDER — COCONUT OIL OIL: 1.0000 "application " | TOPICAL_OIL | Status: DC | PRN

## 2019-10-26 MED ORDER — DEXAMETHASONE SODIUM PHOSPHATE 10 MG/ML IJ SOLN
INTRAMUSCULAR | Status: AC
  Filled 2019-10-26: qty 1

## 2019-10-26 MED ORDER — KETOROLAC TROMETHAMINE 30 MG/ML IJ SOLN
INTRAMUSCULAR | Status: AC
  Filled 2019-10-26: qty 1

## 2019-10-26 MED ORDER — PHENYLEPHRINE HCL-NACL 20-0.9 MG/250ML-% IV SOLN
INTRAVENOUS | Status: AC
  Filled 2019-10-26: qty 250

## 2019-10-26 MED ORDER — SCOPOLAMINE 1 MG/3DAYS TD PT72
MEDICATED_PATCH | TRANSDERMAL | Status: DC | PRN
  Administered 2019-10-26: 1 via TRANSDERMAL

## 2019-10-26 MED ORDER — DEXAMETHASONE SODIUM PHOSPHATE 4 MG/ML IJ SOLN
INTRAMUSCULAR | Status: DC | PRN
  Administered 2019-10-26: 4 mg via INTRAVENOUS

## 2019-10-26 MED ORDER — METHYLERGONOVINE MALEATE 0.2 MG PO TABS
0.2000 mg | ORAL_TABLET | ORAL | Status: DC | PRN
  Administered 2019-10-26: 0.2 mg via ORAL
  Filled 2019-10-26: qty 1

## 2019-10-26 MED ORDER — OXYTOCIN 40 UNITS IN NORMAL SALINE INFUSION - SIMPLE MED
INTRAVENOUS | Status: AC
  Filled 2019-10-26: qty 1000

## 2019-10-26 MED ORDER — KETOROLAC TROMETHAMINE 30 MG/ML IJ SOLN
30.0000 mg | Freq: Once | INTRAMUSCULAR | Status: AC
  Administered 2019-10-26: 09:00:00 30 mg via INTRAVENOUS

## 2019-10-26 MED ORDER — SIMETHICONE 80 MG PO CHEW
80.0000 mg | CHEWABLE_TABLET | ORAL | Status: DC
  Administered 2019-10-26 – 2019-10-27 (×2): 80 mg via ORAL
  Filled 2019-10-26 (×2): qty 1

## 2019-10-26 MED ORDER — HYDROCODONE-ACETAMINOPHEN 5-325 MG PO TABS
1.0000 | ORAL_TABLET | ORAL | Status: DC | PRN
  Administered 2019-10-27: 1 via ORAL
  Administered 2019-10-27: 2 via ORAL
  Administered 2019-10-27: 1 via ORAL
  Administered 2019-10-28 (×3): 2 via ORAL
  Filled 2019-10-26: qty 2
  Filled 2019-10-26 (×2): qty 1
  Filled 2019-10-26 (×3): qty 2

## 2019-10-26 MED ORDER — GENTAMICIN SULFATE 40 MG/ML IJ SOLN
5.0000 mg/kg | INTRAVENOUS | Status: AC
  Administered 2019-10-26: 08:00:00 358.4 mg via INTRAVENOUS
  Filled 2019-10-26: qty 9

## 2019-10-26 MED ORDER — WITCH HAZEL-GLYCERIN EX PADS: 1.0000 "application " | MEDICATED_PAD | CUTANEOUS | Status: DC | PRN

## 2019-10-26 MED ORDER — KETOROLAC TROMETHAMINE 30 MG/ML IJ SOLN
30.0000 mg | Freq: Four times a day (QID) | INTRAMUSCULAR | Status: AC
  Administered 2019-10-26 (×2): 30 mg via INTRAVENOUS
  Filled 2019-10-26 (×3): qty 1

## 2019-10-26 MED ORDER — ENOXAPARIN SODIUM 60 MG/0.6ML ~~LOC~~ SOLN
60.0000 mg | SUBCUTANEOUS | Status: DC
  Administered 2019-10-27 – 2019-10-28 (×2): 60 mg via SUBCUTANEOUS
  Filled 2019-10-26 (×2): qty 0.6

## 2019-10-26 MED ORDER — DIPHENHYDRAMINE HCL 25 MG PO CAPS
25.0000 mg | ORAL_CAPSULE | Freq: Four times a day (QID) | ORAL | Status: DC | PRN
  Administered 2019-10-27: 25 mg via ORAL
  Filled 2019-10-26: qty 1

## 2019-10-26 MED ORDER — FENTANYL CITRATE (PF) 100 MCG/2ML IJ SOLN
INTRAMUSCULAR | Status: AC
  Filled 2019-10-26: qty 2

## 2019-10-26 MED ORDER — OXYTOCIN 40 UNITS IN NORMAL SALINE INFUSION - SIMPLE MED
INTRAVENOUS | Status: DC | PRN
  Administered 2019-10-26: 40 [IU] via INTRAVENOUS

## 2019-10-26 SURGICAL SUPPLY — 40 items
BENZOIN TINCTURE PRP APPL 2/3 (GAUZE/BANDAGES/DRESSINGS) ×2 IMPLANT
CHLORAPREP W/TINT 26ML (MISCELLANEOUS) ×2 IMPLANT
CLAMP CORD UMBIL (MISCELLANEOUS) IMPLANT
CLOTH BEACON ORANGE TIMEOUT ST (SAFETY) ×2 IMPLANT
DERMABOND ADVANCED (GAUZE/BANDAGES/DRESSINGS) ×1
DERMABOND ADVANCED .7 DNX12 (GAUZE/BANDAGES/DRESSINGS) ×1 IMPLANT
DRSG OPSITE POSTOP 4X10 (GAUZE/BANDAGES/DRESSINGS) ×2 IMPLANT
ELECT REM PT RETURN 9FT ADLT (ELECTROSURGICAL) ×2
ELECTRODE REM PT RTRN 9FT ADLT (ELECTROSURGICAL) ×1 IMPLANT
EXTRACTOR VACUUM BELL CUP MITY (SUCTIONS) ×2 IMPLANT
EXTRACTOR VACUUM BELL STYLE (SUCTIONS) IMPLANT
GLOVE BIOGEL PI IND STRL 7.0 (GLOVE) ×1 IMPLANT
GLOVE BIOGEL PI IND STRL 8 (GLOVE) ×1 IMPLANT
GLOVE BIOGEL PI INDICATOR 7.0 (GLOVE) ×1
GLOVE BIOGEL PI INDICATOR 8 (GLOVE) ×1
GLOVE ECLIPSE 8.0 STRL XLNG CF (GLOVE) ×2 IMPLANT
GOWN STRL REUS W/TWL LRG LVL3 (GOWN DISPOSABLE) ×4 IMPLANT
KIT ABG SYR 3ML LUER SLIP (SYRINGE) ×2 IMPLANT
NEEDLE HYPO 18GX1.5 BLUNT FILL (NEEDLE) ×2 IMPLANT
NEEDLE HYPO 22GX1.5 SAFETY (NEEDLE) ×2 IMPLANT
NEEDLE HYPO 25X5/8 SAFETYGLIDE (NEEDLE) ×2 IMPLANT
NS IRRIG 1000ML POUR BTL (IV SOLUTION) ×2 IMPLANT
PACK C SECTION WH (CUSTOM PROCEDURE TRAY) ×2 IMPLANT
PAD OB MATERNITY 4.3X12.25 (PERSONAL CARE ITEMS) ×2 IMPLANT
PENCIL SMOKE EVAC W/HOLSTER (ELECTROSURGICAL) ×2 IMPLANT
RTRCTR C-SECT PINK 25CM LRG (MISCELLANEOUS) IMPLANT
STRIP SURGICAL 1/2 X 6 IN (GAUZE/BANDAGES/DRESSINGS) ×2 IMPLANT
SUT CHROMIC 0 CT 1 (SUTURE) ×2 IMPLANT
SUT MNCRL 0 VIOLET CTX 36 (SUTURE) ×2 IMPLANT
SUT MONOCRYL 0 CTX 36 (SUTURE) ×2
SUT PLAIN 2 0 (SUTURE)
SUT PLAIN 2 0 XLH (SUTURE) IMPLANT
SUT PLAIN ABS 2-0 CT1 27XMFL (SUTURE) IMPLANT
SUT VIC AB 0 CTX 36 (SUTURE) ×1
SUT VIC AB 0 CTX36XBRD ANBCTRL (SUTURE) ×1 IMPLANT
SUT VIC AB 4-0 KS 27 (SUTURE) IMPLANT
SYR 20CC LL (SYRINGE) ×4 IMPLANT
TOWEL OR 17X24 6PK STRL BLUE (TOWEL DISPOSABLE) ×2 IMPLANT
TRAY FOLEY W/BAG SLVR 14FR LF (SET/KITS/TRAYS/PACK) IMPLANT
WATER STERILE IRR 1000ML POUR (IV SOLUTION) ×2 IMPLANT

## 2019-10-26 NOTE — Anesthesia Procedure Notes (Signed)
Spinal  Patient location during procedure: OR Start time: 10/26/2019 7:23 AM End time: 10/26/2019 7:27 AM Staffing Anesthesiologist: Josephine Igo, MD Performed: anesthesiologist  Preanesthetic Checklist Completed: patient identified, site marked, surgical consent, pre-op evaluation, timeout performed, IV checked, risks and benefits discussed and monitors and equipment checked Spinal Block Patient position: sitting Prep: site prepped and draped and DuraPrep Patient monitoring: heart rate, cardiac monitor, continuous pulse ox and blood pressure Approach: midline Location: L3-4 Injection technique: single-shot Needle Needle type: Pencan  Needle gauge: 24 G Needle length: 9 cm Needle insertion depth: 8 cm Assessment Sensory level: T4 Additional Notes Patient tolerated procedure well. Adequate sensory level.

## 2019-10-26 NOTE — Op Note (Signed)
Cesarean Section Procedure Note   Loretta Moore  10/26/2019  Indications: Scheduled Proceedure/Maternal Request   Pre-operative Diagnosis: REPEAT ELECTIVE CESAREN SECTION.   Post-operative Diagnosis: Same   Surgeon: Surgeon(s) and Role:    * Eure, Mertie Clause, MD - Primary    * Dione Plover Annice Needy, MD - Assisting   Attending Attestation: I was present and scrubbed for the entire procedure.   Assistants: Butch Penny, MS3  Anesthesia: spinal    Estimated Blood Loss: 294 cc  Total IV Fluids: 2400 ml   Urine Output: 250CC OF clear urine  Specimens: @ORSPECIMEN @   Findings:  Baby condition / location:  Couplet care / Skin to Skin 3090 gm APGAR: 10, 10.     Complications: no complications  Indications: Loretta Moore is a 23 y.o. G2P1001 with an IUP [redacted]w[redacted]d presenting for repeat elective cesarean for history of prior cesarean.  The risks, benefits, complications, treatment options, and expected outcomes were discussed with the patient . The patient concurred with the proposed plan, giving informed consent. identified as Surveyor, quantity and the procedure verified as C-Section Delivery.  Procedure Details: A Time Out was held and the above information confirmed.  The patient was taken back to the operative suite where spinal anesthesia was placed. After induction of anesthesia, the patient was draped and prepped in the usual sterile manner and placed in a dorsal supine position with a leftward tilt. A transverse was made and carried down through the subcutaneous tissue to the fascia. Fascial incision was made and extended transversely. The fascia was separated from the underlying rectus tissue superiorly and inferiorly. The peritoneum was identified and entered. Peritoneal incision was extended longitudinally. The utero-vesical peritoneal reflection was incised transversely and the bladder flap was bluntly freed from the lower uterine segment. A low transverse uterine incision was made.  Delivered from cephalic presentation was a 3090 grams Female with Apgar scores of 10 at one minute and 10 at five minutes. Cord ph was not sent the umbilical cord was clamped and cut cord blood was obtained for evaluation. The placenta was removed Intact and appeared normal. The uterine outline, tubes and ovaries appeared normal}. The uterine incision was closed with running locked sutures of 0-Monocryl. Hemostasis was observed. Lavage was carried out until clear. The fascia was then reapproximated with running sutures of 0Vicryl. The subcuticular closure was not done. The skin was closed with 4-0Vicryl.   Instrument, sponge, and needle counts were correct prior the abdominal closure and were correct at the conclusion of the case.     Disposition: PACU - hemodynamically stable.   Maternal Condition: stable       Signed: Clarnce Flock MD 10/26/2019 8:39 AM

## 2019-10-26 NOTE — H&P (Signed)
LABOR AND DELIVERY ADMISSION HISTORY AND PHYSICAL NOTE  Loretta Moore is a 23 y.o. female G2P1001 with IUP at [redacted]w[redacted]d by 8wk [place] presenting for scheduled elective repeat cesarean for history of prior cesarean.   She reports positive fetal movement. She denies leakage of fluid or vaginal bleeding.   She plans on breast feeding. She plans on natural family planning for birth control.  Prenatal History/Complications: PNC at [facility]  Sono:  @[redacted]w[redacted]d , CWD, normal anatomy, breech presentation, anterior placenta, 68%ile, EFW 562 grams  Pregnancy complications:  - Hx of hidradenitis suppurativa, with multiple surgeries around mons pubis/groin/perineum - asthma  Past Medical History: Past Medical History:  Diagnosis Date  . Anemia   . Asthma   . Hard of hearing   . Hidradenitis suppurativa    had excisions in axilla and groin    Past Surgical History: Past Surgical History:  Procedure Laterality Date  . AXILLARY HIDRADENITIS EXCISION    . CESAREAN SECTION N/A 08/29/2015   Procedure: CESAREAN SECTION;  Surgeon: Truett Mainland, DO;  Location: [facility];  Service: Obstetrics;  Laterality: N/A;  . HYDRADENITIS EXCISION Bilateral 01/29/2019   Procedure: Excision Hidradentitis groin (right) and right and left flank;  Surgeon: Erroll Luna, MD;  Location: [place];  Service: General;  Laterality: Bilateral;  . INGUINAL HIDRADENITIS EXCISION    . TONSILLECTOMY AND ADENOIDECTOMY      Obstetrical History: OB History    Gravida  2   Para  1   Term  1   Preterm      AB      Living  1     SAB      TAB      Ectopic      Multiple  0   Live Births  1           Social History: Social History   Socioeconomic History  . Marital status: Single    Spouse name: trebein mayhand  . Number of children: 1  . Years of education: Not on file  . Highest education level: Some college, no degree  Occupational History  . Not on file  Social Needs  . Financial  resource strain: Hard  . Food insecurity    Worry: Not on file    Inability: Not on file  . Transportation needs    Medical: Yes    Non-medical: Yes  Tobacco Use  . Smoking status: Current Every Day Smoker    Packs/day: 0.50    Years: 3.00    Pack years: 1.50    Types: Cigarettes    Last attempt to quit: 06/02/2015    Years since quitting: 4.4  . Smokeless tobacco: Never Used  Substance and Sexual Activity  . Alcohol use: Not Currently    Alcohol/week: 0.0 standard drinks    Comment: on weekends  . Drug use: No  . Sexual activity: Yes    Birth control/protection: None  Lifestyle  . Physical activity    Days per week: 1 day    Minutes per session: 20 min  . Stress: Only a little  Relationships  . Social connections    Talks on phone: More than three times a week    Gets together: Twice a week    Attends religious service: 1 to 4 times per year    Active member of club or organization: Yes    Attends meetings of clubs or organizations: 1 to 4 times per year    Relationship  status: Never married  Other Topics Concern  . Not on file  Social History Narrative  . Not on file    Family History: Family History  Problem Relation Age of Onset  . Anemia Mother   . Hypertension Mother   . Other Mother        hidradenitis  . Epilepsy Father   . Hypertension Father   . Other Sister        hidradenitis  . Epilepsy Brother   . Other Paternal Grandmother        aneursym  . Cancer Paternal Grandfather   . Hypertension Paternal Grandfather     Allergies: Allergies  Allergen Reactions  . Bee Venom Anaphylaxis  . Amoxicillin Hives  . Augmentin [Amoxicillin-Pot Clavulanate] Hives  . Ceclor [Cefaclor] Hives  . Penicillins Hives    Has patient had a PCN reaction causing immediate rash, facial/tongue/throat swelling, SOB or lightheadedness with hypotension: Yes Has patient had a PCN reaction causing severe rash involving mucus membranes or skin necrosis: No Has patient had  a PCN reaction that required hospitalization No Has patient had a PCN reaction occurring within the last 10 years: No If all of the above answers are "NO", then may proceed with Cephalosporin use.   . Sulfa Antibiotics Hives  . Tomato Hives  . Other Rash    Paper tape causes rash    No medications prior to admission.     Review of Systems  All systems reviewed and negative except as stated in HPI  Physical Exam Last menstrual period 01/25/2019, currently breastfeeding. General appearance: alert, oriented, NAD Lungs: normal respiratory effort Heart: regular rate Abdomen: soft, non-tender; gravid Extremities: No calf swelling or tenderness FHR: 135  Prenatal labs: ABO, Rh: --/--/O POS, O POS Performed at [facility], [address] [place], [place] [address]  [phone](11/19 0901) Antibody: NEG (11/19 0901) Rubella: 4.37 (05/26 1628) RPR: NON REACTIVE (11/19 0901)  HBsAg: Negative (05/26 1628)  HIV: Non Reactive (08/31 0849)  GC/Chlamydia: neg/neg 10/11/2019  GBS: --Lottie Dawson/Positive (11/06 1356), clindamycin resistant 2-hr GTT: normal 08/05/2019 Genetic screening:  Materni21 negative screen Anatomy US: normal  Prenatal Transfer Tool  Maternal Diabetes: No Genetic Screening: Normal Maternal Ultrasounds/Referrals: Normal Fetal Ultrasounds or other Referrals:  None Maternal Substance Abuse:  No Significant Maternal Medications:  None Significant Maternal Lab Results: Group B Strep positive  No results found for this or any previous visit (from the past 24 hour(s)).  Patient Active Problem List   Diagnosis Date Noted  . Marijuana use 05/02/2019  . Supervision of normal pregnancy 04/30/2019  . S/P cesarean section 09/01/2015  . Asthma 04/29/2015  . Smoker 04/29/2015  . Hidradenitis suppurativa   . Hard of hearing     Assessment: Loretta Moore is a 23 y.o. G2P1001 at 2389w1d here for scheduled CS.  #Scheduled Elective RCS:  The risks of cesarean section discussed with  the patient included but were not limited to: bleeding which may require transfusion or reoperation; infection which may require antibiotics; injury to bowel, bladder, ureters or other surrounding organs; injury to the fetus; need for additional procedures including hysterectomy in the event of a life-threatening hemorrhage; placental abnormalities with subsequent pregnancies, incisional problems, thromboembolic phenomenon and other postoperative/anesthesia complications. The patient concurred with the proposed plan, giving informed written consent for the procedure. Patient has been NPO since last night she will remain NPO for procedure. Anesthesia and OR aware. Preoperative prophylactic antibiotics and SCDs ordered on call to the OR. To  OR when ready.   #Anesthesia: Spinal #FWB: FHR 135 #GBS/ID: Positive #COVID: swab negative  on 10/24/2019 #MOF: Breast #MOC: NFP #Circ: Yes, at [facility]  Venora Maples 10/26/2019, 1:59 AM

## 2019-10-26 NOTE — Anesthesia Postprocedure Evaluation (Signed)
Anesthesia Post Note  Patient: Surveyor, quantity  Procedure(s) Performed: REPEAT CESAREAN SECTION (N/A Abdomen)     Patient location during evaluation: PACU Anesthesia Type: Spinal Level of consciousness: oriented and awake and alert Pain management: pain level controlled Vital Signs Assessment: post-procedure vital signs reviewed and stable Respiratory status: spontaneous breathing, respiratory function stable and nonlabored ventilation Cardiovascular status: blood pressure returned to baseline and stable Postop Assessment: no headache, no backache, no apparent nausea or vomiting, spinal receding and patient able to bend at knees Anesthetic complications: no    Last Vitals:  Vitals:   10/26/19 0915 10/26/19 0930  BP: 128/78 115/75  Pulse: 75 82  Resp: 16 16  Temp:    SpO2: 99% 97%    Last Pain:  Vitals:   10/26/19 0915  PainSc: 6    Pain Goal: Patients Stated Pain Goal: 6 (10/26/19 0915)              Epidural/Spinal Function Cutaneous sensation: Able to Wiggle Toes (10/26/19 0915), Patient able to flex knees: Yes (10/26/19 0915), Patient able to lift hips off bed: No (10/26/19 0915), Back pain beyond tenderness at insertion site: No (10/26/19 0915), Progressively worsening motor and/or sensory loss: No (10/26/19 0915), Bowel and/or bladder incontinence post epidural: No (10/26/19 0915)  Thersa Mohiuddin A.

## 2019-10-26 NOTE — Progress Notes (Signed)
CNM called to bedside to evaluate PP bleeding. Triton at bedside and weighed two pads with 154ml and 38ml. Fundus firm, midline, U1. No bleeding on exam. Patient asymptomatic, VSS. Will continue to monitor bleeding.   Wende Mott, CNM 10/26/19 12:54 PM

## 2019-10-26 NOTE — Transfer of Care (Signed)
Immediate Anesthesia Transfer of Care Note  Patient: Surveyor, quantity  Procedure(s) Performed: REPEAT CESAREAN SECTION (N/A Abdomen)  Patient Location: PACU  Anesthesia Type:Spinal  Level of Consciousness: awake, alert , oriented and patient cooperative  Airway & Oxygen Therapy: Patient Spontanous Breathing  Post-op Assessment: Report given to RN, Post -op Vital signs reviewed and stable and Patient moving all extremities X 4  Post vital signs: Reviewed and stable  Last Vitals:  Vitals Value Taken Time  BP    Temp    Pulse    Resp    SpO2      Last Pain:  Vitals:   10/26/19 0646  PainSc: 0-No pain         Complications: No apparent anesthesia complications

## 2019-10-26 NOTE — Discharge Summary (Signed)
Postpartum Discharge Summary     Patient Name: Loretta Moore DOB: 1996/02/09 MRN: [id number]  Date of admission: 10/26/2019 Delivering Provider: Florian Buff   Date of discharge: 10/28/2019  Admitting diagnosis: REPEAT CESAREN SECTION Intrauterine pregnancy: [redacted]w[redacted]d    Secondary diagnosis:  Active Problems:   Hidradenitis suppurativa   Asthma   S/P cesarean section   Supervision of normal pregnancy   Cesarean delivery delivered  Additional problems: None     Discharge diagnosis: Term Pregnancy Delivered                                                                                                Post partum procedures:None  Augmentation: n/a  Complications: None  Hospital course:  Sceduled C/S   23y.o. yo G2P1001 at 337w1das admitted to the hospital 10/26/2019 for scheduled cesarean section with the following indication:Elective Repeat.  Membrane Rupture Time/Date: 7:55 AM ,10/26/2019   Patient delivered a Viable infant.10/26/2019  Details of operation can be found in separate operative note.  Pateint had an uncomplicated postpartum course.  She is ambulating, tolerating a regular diet, passing flatus, and urinating well. Patient is discharged home in stable condition on  10/28/19        Delivery time: 7:56 AM    Magnesium Sulfate received: No BMZ received: No Rhophylac:N/A MMR:N/A Transfusion:No  Physical exam  Vitals:   10/27/19 0615 10/27/19 1744 10/27/19 2125 10/28/19 0619  BP: 127/61 130/63 (!) 126/100 100/89  Pulse: 72 100 95 85  Resp: '16  15 18  '$ Temp: 97.7 F (36.5 C) 98.7 F (37.1 C) 98.8 F (37.1 C) 98.1 F (36.7 C)  TempSrc: Axillary Axillary Oral Oral  SpO2: 100%  100% 100%  Weight:      Height:       General: alert, cooperative and no distress Lochia: appropriate Uterine Fundus: firm Incision: Healing well with no significant drainage DVT Evaluation: No evidence of DVT seen on physical exam. Negative Homan's sign. No cords or calf  tenderness. No significant calf/ankle edema. Labs: Lab Results  Component Value Date   WBC 14.0 (H) 10/27/2019   HGB 9.6 (L) 10/27/2019   HCT 28.6 (L) 10/27/2019   MCV 90.5 10/27/2019   PLT 254 10/27/2019   CMP Latest Ref Rng & Units 09/10/2019  Glucose 70 - 99 mg/dL 95  BUN 6 - 20 mg/dL <5(L)  Creatinine 0.44 - 1.00 mg/dL 0.55  Sodium 135 - 145 mmol/L 135  Potassium 3.5 - 5.1 mmol/L 3.4(L)  Chloride 98 - 111 mmol/L 102  CO2 22 - 32 mmol/L 23  Calcium 8.9 - 10.3 mg/dL 8.8(L)  Total Protein 6.5 - 8.1 g/dL 6.7  Total Bilirubin 0.3 - 1.2 mg/dL 0.3  Alkaline Phos 38 - 126 U/L 106  AST 15 - 41 U/L 18  ALT 0 - 44 U/L 13    Discharge instruction: per After Visit Summary and "Baby and Me Booklet".  After visit meds:  Allergies as of 10/28/2019      Reactions   Bee Venom Anaphylaxis   Amoxicillin Hives   Augmentin [amoxicillin-pot Clavulanate] Hives  Ceclor [cefaclor] Hives   Penicillins Hives   Has patient had a PCN reaction causing immediate rash, facial/tongue/throat swelling, SOB or lightheadedness with hypotension: Yes Has patient had a PCN reaction causing severe rash involving mucus membranes or skin necrosis: No Has patient had a PCN reaction that required hospitalization No Has patient had a PCN reaction occurring within the last 10 years: No If all of the above answers are "NO", then may proceed with Cephalosporin use.   Sulfa Antibiotics Hives   Tomato Hives   Other Rash   Paper tape causes rash      Medication List    TAKE these medications   acetaminophen 500 MG tablet Commonly known as: TYLENOL Take 500 mg by mouth every 6 (six) hours as needed for mild pain or headache.   albuterol (2.5 MG/3ML) 0.083% nebulizer solution Commonly known as: PROVENTIL Take 2.5 mg by nebulization every 6 (six) hours as needed for wheezing or shortness of breath.   albuterol 108 (90 Base) MCG/ACT inhaler Commonly known as: VENTOLIN HFA Inhale 1-2 puffs into the lungs  every 6 (six) hours as needed for wheezing or shortness of breath.   ibuprofen 800 MG tablet Commonly known as: ADVIL Take 1 tablet (800 mg total) by mouth every 8 (eight) hours.   oxyCODONE 5 MG immediate release tablet Commonly known as: Roxicodone Take 1 tablet (5 mg total) by mouth every 8 (eight) hours as needed.   prenatal vitamin w/FE, FA 27-1 MG Tabs tablet Take 1 tablet by mouth daily at 12 noon.       Diet: routine diet  Activity: Advance as tolerated. Pelvic rest for 6 weeks.   Outpatient follow up:6 weeks Follow up Appt: Future Appointments  Date Time Provider [place]  11/04/2019 11:30 AM Florian Buff, MD [facility]   Follow up Visit:   Please schedule this patient for Postpartum visit in: 6 weeks with the following provider: Any provider For C/S patients schedule nurse incision check in weeks 2 weeks: yes Low risk pregnancy complicated by: n/a Delivery mode:  CS Anticipated Birth Control:  NFP PP Procedures needed: Incision check  Schedule Integrated BH visit: no   Newborn Data: Live born female  Birth Weight:  3090 g APGAR: 28, 10  Newborn Delivery   Birth date/time: 10/26/2019 07:56:00 Delivery type: C-Section, Low Transverse Trial of labor: No C-section categorization: Repeat      Baby Feeding: Breast Disposition:home with mother   10/28/2019 Merilyn Baba, DO

## 2019-10-26 NOTE — Progress Notes (Signed)
CNM called to evaluate bleeding. Fundus firm, U1. No bleeding on exam. Patient passed several small clots weighing approximately 313ml. EBL total 800 including loss at time of c/s. Will give methergine series due to continued bleeding postpartum.   Wende Mott, CNM 10/26/19 6:31 PM

## 2019-10-27 ENCOUNTER — Other Ambulatory Visit: Payer: Self-pay

## 2019-10-27 LAB — CBC
HCT: 28.6 % — ABNORMAL LOW (ref 36.0–46.0)
Hemoglobin: 9.6 g/dL — ABNORMAL LOW (ref 12.0–15.0)
MCH: 30.4 pg (ref 26.0–34.0)
MCHC: 33.6 g/dL (ref 30.0–36.0)
MCV: 90.5 fL (ref 80.0–100.0)
Platelets: 254 10*3/uL (ref 150–400)
RBC: 3.16 MIL/uL — ABNORMAL LOW (ref 3.87–5.11)
RDW: 13.7 % (ref 11.5–15.5)
WBC: 14 10*3/uL — ABNORMAL HIGH (ref 4.0–10.5)
nRBC: 0 % (ref 0.0–0.2)

## 2019-10-27 MED ORDER — IBUPROFEN 800 MG PO TABS
800.0000 mg | ORAL_TABLET | Freq: Once | ORAL | Status: AC
  Administered 2019-10-27: 800 mg via ORAL
  Filled 2019-10-27: qty 1

## 2019-10-27 NOTE — Lactation Note (Signed)
This note was copied from a baby's chart. Lactation Consultation Note RN states mom says BF going well. Mom has been busy during the night. Went walking outside, then took a shower, now mom is sleeping so soundly can't hardly wake mom up. Baby and FOB sleeping as well. LC attempted to talk to mom, fell asleep talking.  Left brochure, asked to call for [facility] when she wakes up. Mom started snoring. RN states mom has been feeding well per mom.  Patient Name: Loretta Moore [id number] Date: 10/27/2019     Maternal Data    Feeding Feeding Type: Breast Fed  LATCH Score                   Interventions    Lactation Tools Discussed/Used     Consult Status      Loretta Moore 10/27/2019, 6:48 AM

## 2019-10-27 NOTE — Progress Notes (Signed)
POSTPARTUM PROGRESS NOTE  Post Operative Day 1  Subjective:  Loretta Moore is a 23 y.o. W1X9147 s/p RCS at [redacted]w[redacted]d.  No acute events overnight.  Pt denies problems with ambulating, voiding or po intake.  She denies nausea or vomiting.  Pain is moderately controlled- patient reports she is sore, has only taken ibuprofen, requesting stronger medication- has norco ordered but has not been given it, request for RN to give medication.  She has not had flatus. She has not had bowel movement.  Lochia Moderate.   Objective: Blood pressure 127/61, pulse 72, temperature 97.7 F (36.5 C), temperature source Axillary, resp. rate 16, height 5' (1.524 m), weight 114.2 kg, last menstrual period 01/25/2019, SpO2 100 %, unknown if currently breastfeeding.  Physical Exam:  General: alert, cooperative and no distress Chest: no respiratory distress Heart:regular rate, distal pulses intact Abdomen: soft, nontender,  Uterine Fundus: firm, appropriately tender DVT Evaluation: No calf swelling or tenderness Extremities: no edema Skin: pressure dressing intact   Recent Labs    10/27/19 0454  HGB 9.6*  HCT 28.6*    Assessment/Plan: Loretta Moore is a 23 y.o. [id number] s/p RCS at [redacted]w[redacted]d   POD#1 - Doing well Contraception: NFP Feeding: Breast Dispo: Plan for discharge tomorrow.   LOS: 1 day   Lajean Manes, CNM 10/27/2019, 11:13 AM

## 2019-10-27 NOTE — Clinical Social Work Maternal (Signed)
CLINICAL SOCIAL WORK MATERNAL/CHILD NOTE  Patient Details  Name: Loretta Moore MRN: [id number] Date of Birth: 11-Jul-1996  Date:  10/27/2019  Clinical Social Worker Initiating Note:  Laurey Arrow Date/Time: Initiated:  10/27/19/1336     Child's Name:  Loretta Moore   Biological Parents:  Mother, Father   Need for Interpreter:  None   Reason for Referral:      Address:  [place] [address]    Phone number:  [phone] (home)     Additional phone number:  Household Members/Support Persons (HM/SP):   Household Member/Support Person 1, Household Member/Support Person 2   HM/SP Name Relationship DOB or Age  HM/SP -1 Loretta Moore FOB 11/215/1996  HM/SP -2 Loretta Moore son 08/29/2015  HM/SP -3        HM/SP -4        HM/SP -5        HM/SP -6        HM/SP -7        HM/SP -8          Natural Supports (not living in the home):  Extended Family, Immediate Family, Parent   Professional Supports: None   Employment: Unemployed   Type of Work:     Education:  [place] arranged:    Museum/gallery curator Resources:  Medicaid   Other Resources:      Cultural/Religious Considerations Which May Impact Care:  none reported  Strengths:  Home prepared for child , Ability to meet basic needs    Psychotropic Medications:         Pediatrician:       Pediatrician List:   [name]      Pediatrician Fax Number:    Risk Factors/Current Problems:      Cognitive State:  Alert , Insightful , Linear Thinking , Goal Oriented    Mood/Affect:  Happy , Interested , Comfortable , Relaxed    CSW Assessment: CSW met with [name] in room 519 to complete an assessment for a consult for hx of THC use in pregnancy.  [name] was polite and was receptive with meeting with CSW.  When CSW arrived, [name] was bonding with infant as evident by [name] engaging in skin to skin.  [name] and infant appeared comfortable an happy. CSW inquired about [name]'s substance use, and [name] reported utilizing marijuana during pregnancy to assist with decreasing her pain related to a medical condition and also to assist with sleep. [name] reported last use of a marijuana was about 2 days ago.   CSW informed [name] of the hospital's drug screen policy. [name] was made aware of the 2 drug screenings for the infant.  [name] was understanding and did not have any concerns.  CSW shared with [name] that the infant's UDS is positive and CSW will make a report to [facility] CPS. [name] is aware the CSW will continue to monitor the infant's CDS and will update [facility] CPS of the results. CSW offered [name] resources and referrals for substance interventions and [name] declined. [name] also denied having any CPS hx. [name] did not have any questions about the hospital's policy and reported having all essential items for infant.  [name] also reported having a good support team that will be available if warranted.   There are on barriers to discharge.  CPS will follow-up  with family within in 77 hours.   CSW Plan/Description:  No Further Intervention Required/No Barriers to Discharge, Sudden Infant Death Syndrome (SIDS) Education, Perinatal Mood and Anxiety Disorder (PMADs) Education, Other Patient/Family Education, [place], Other Information/Referral to [employer], Child Protective Service Report , CSW Will Continue to Monitor Umbilical Cord Tissue Drug Screen Results and Make Report if Warranted   Laurey Arrow, MSW, LCSW Clinical Social Work [phone]   Dimple Nanas, LCSW 10/27/2019, 1:38 PM

## 2019-10-27 NOTE — Lactation Note (Addendum)
This note was copied from a baby's chart. Lactation Consultation Note  Patient Name: Loretta Moore [id number] Date: 10/27/2019 Reason for consult: Follow-up assessment   Baby 26 hours old and latched upon entering.   Assisted mother with repositioning for a deeper latch. Reminded mother to breastfeed on both breasts per feeding and feed on demand w/ cues. Mother states she feels latch is improving.  Feed on demand with cues.  Goal 8-12+ times per day after first 24 hrs.  Place baby STS if not cueing.  Mother is happy because this baby is latching better than her first.    Maternal Data    Feeding Feeding Type: Breast Fed  LATCH Score Latch: Grasps breast easily, tongue down, lips flanged, rhythmical sucking.(latched upon entering)  Audible Swallowing: A few with stimulation  Type of Nipple: Everted at rest and after stimulation  Comfort (Breast/Nipple): Soft / non-tender  Hold (Positioning): Assistance needed to correctly position infant at breast and maintain latch.  LATCH Score: 8  Interventions Interventions: Breast feeding basics reviewed;Assisted with latch  Lactation Tools Discussed/Used     Consult Status Consult Status: Follow-up Date: 10/28/19 Follow-up type: In-patient    Vivianne Master [facility] 10/27/2019, 2:00 PM

## 2019-10-28 MED ORDER — OXYCODONE HCL 5 MG PO TABS: 5.0000 mg | ORAL_TABLET | Freq: Three times a day (TID) | ORAL | 0 refills | Status: AC | PRN

## 2019-10-28 MED ORDER — IBUPROFEN 800 MG PO TABS: 800.0000 mg | ORAL_TABLET | Freq: Three times a day (TID) | ORAL | 0 refills | Status: DC

## 2019-10-28 NOTE — Discharge Instructions (Signed)
Postpartum Care After Cesarean Delivery °This sheet gives you information about how to care for yourself from the time you deliver your baby to up to 6-12 weeks after delivery (postpartum period). Your health care provider may also give you more specific instructions. If you have problems or questions, contact your health care provider. °Follow these instructions at home: °Medicines °· Take over-the-counter and prescription medicines only as told by your health care provider. °· If you were prescribed an antibiotic medicine, take it as told by your health care provider. Do not stop taking the antibiotic even if you start to feel better. °· Ask your health care provider if the medicine prescribed to you: °? Requires you to avoid driving or using heavy machinery. °? Can cause constipation. You may need to take actions to prevent or treat constipation, such as: °§ Drink enough fluid to keep your urine pale yellow. °§ Take over-the-counter or prescription medicines. °§ Eat foods that are high in fiber, such as beans, whole grains, and fresh fruits and vegetables. °§ Limit foods that are high in fat and processed sugars, such as fried or sweet foods. °Activity °· Gradually return to your normal activities as told by your health care provider. °· Avoid activities that take a lot of effort and energy (are strenuous) until approved by your health care provider. Walking at a slow to moderate pace is usually safe. Ask your health care provider what activities are safe for you. °? Do not lift anything that is heavier than your baby or 10 lb (4.5 kg) as told by your health care provider. °? Do not vacuum, climb stairs, or drive a car for as long as told by your health care provider. °· If possible, have someone help you at home until you are able to do your usual activities yourself. °· Rest as much as possible. Try to rest or take naps while your baby is sleeping. °Vaginal bleeding °· It is normal to have vaginal bleeding  (lochia) after delivery. Wear a sanitary pad to absorb vaginal bleeding and discharge. °? During the first week after delivery, the amount and appearance of lochia is often similar to a menstrual period. °? Over the next few weeks, it will gradually decrease to a dry, yellow-brown discharge. °? For most women, lochia stops completely by 4-6 weeks after delivery. Vaginal bleeding can vary from woman to woman. °· Change your sanitary pads frequently. Watch for any changes in your flow, such as: °? A sudden increase in volume. °? A change in color. °? Large blood clots. °· If you pass a blood clot, save it and call your health care provider to discuss. Do not flush blood clots down the toilet before you get instructions from your health care provider. °· Do not use tampons or douches until your health care provider says this is safe. °· If you are not breastfeeding, your period should return 6-8 weeks after delivery. If you are breastfeeding, your period may return anytime between 8 weeks after delivery and the time that you stop breastfeeding. °Perineal care ° °· If your C-section (Cesarean section) was unplanned, and you were allowed to labor and push before delivery, you may have pain, swelling, and discomfort of the tissue between your vaginal opening and your anus (perineum). You may also have an incision in the tissue (episiotomy) or the tissue may have torn during delivery. Follow these instructions as told by your health care provider: °? Keep your perineum clean and dry as told by   your health care provider. Use medicated pads and pain-relieving sprays and creams as directed. °? If you have an episiotomy or vaginal tear, check the area every day for signs of infection. Check for: °§ Redness, swelling, or pain. °§ Fluid or blood. °§ Warmth. °§ Pus or a bad smell. °? You may be given a squirt bottle to use instead of wiping to clean the perineum area after you go to the bathroom. As you start healing, you may use  the squirt bottle before wiping yourself. Make sure to wipe gently. °? To relieve pain caused by an episiotomy, vaginal tear, or hemorrhoids, try taking a warm sitz bath 2-3 times a day. A sitz bath is a warm water bath that is taken while you are sitting down. The water should only come up to your hips and should cover your buttocks. °Breast care °· Within the first few days after delivery, your breasts may feel heavy, full, and uncomfortable (breast engorgement). You may also have milk leaking from your breasts. Your health care provider can suggest ways to help relieve breast discomfort. Breast engorgement should go away within a few days. °· If you are breastfeeding: °? Wear a bra that supports your breasts and fits you well. °? Keep your nipples clean and dry. Apply creams and ointments as told by your health care provider. °? You may need to use breast pads to absorb milk leakage. °? You may have uterine contractions every time you breastfeed for several weeks after delivery. Uterine contractions help your uterus return to its normal size. °? If you have any problems with breastfeeding, work with your health care provider or a lactation consultant. °· If you are not breastfeeding: °? Avoid touching your breasts as this can make your breasts produce more milk. °? Wear a well-fitting bra and use cold packs to help with swelling. °? Do not squeeze out (express) milk. This causes you to make more milk. °Intimacy and sexuality °· Ask your health care provider when you can engage in sexual activity. This may depend on your: °? Risk of infection. °? Healing rate. °? Comfort and desire to engage in sexual activity. °· You are able to get pregnant after delivery, even if you have not had your period. If desired, talk with your health care provider about methods of family planning or birth control (contraception). °Lifestyle °· Do not use any products that contain nicotine or tobacco, such as cigarettes, e-cigarettes,  and chewing tobacco. If you need help quitting, ask your health care provider. °· Do not drink alcohol, especially if you are breastfeeding. °Eating and drinking ° °· Drink enough fluid to keep your urine pale yellow. °· Eat high-fiber foods every day. These may help prevent or relieve constipation. High-fiber foods include: °? Whole grain cereals and breads. °? Brown rice. °? Beans. °? Fresh fruits and vegetables. °· Take your prenatal vitamins until your postpartum checkup or until your health care provider tells you it is okay to stop. °General instructions °· Keep all follow-up visits for you and your baby as told by your health care provider. Most women visit their health care provider for a postpartum checkup within the first 3-6 weeks after delivery. °Contact a health care provider if you: °· Feel unable to cope with the changes that a new baby brings to your life, and these feelings do not go away. °· Feel unusually sad or worried. °· Have breasts that are painful, hard, or turn red. °· Have a fever. °·   Have trouble holding urine or keeping urine from leaking. °· Have little or no interest in activities you used to enjoy. °· Have not breastfed at all and you have not had a menstrual period for 12 weeks after delivery. °· Have stopped breastfeeding and you have not had a menstrual period for 12 weeks after you stopped breastfeeding. °· Have questions about caring for yourself or your baby. °· Pass a blood clot from your vagina. °Get help right away if you: °· Have chest pain. °· Have difficulty breathing. °· Have sudden, severe leg pain. °· Have severe pain or cramping in your abdomen. °· Bleed from your vagina so much that you fill more than one sanitary pad in one hour. Bleeding should not be heavier than your heaviest period. °· Develop a severe headache. °· Faint. °· Have blurred vision or spots in your vision. °· Have a bad-smelling vaginal discharge. °· Have thoughts about hurting yourself or your  baby. °If you ever feel like you may hurt yourself or others, or have thoughts about taking your own life, get help right away. You can go to your nearest emergency department or call: °· Your local emergency services (911 in the [place] °· A suicide crisis helpline, such as the National Suicide Prevention Lifeline at 1-800-273-8255. This is open 24 hours a day. °Summary °· The period of time from when you deliver your baby to up to 6-12 weeks after delivery is called the postpartum period. °· Gradually return to your normal activities as told by your health care provider. °· Keep all follow-up visits for you and your baby as told by your health care provider. °This information is not intended to replace advice given to you by your health care provider. Make sure you discuss any questions you have with your health care provider. °Document Released: 11/18/2000 Document Revised: 07/11/2018 Document Reviewed: 07/11/2018 °Elsevier Patient Education © 2020 [employer] ° °

## 2019-10-28 NOTE — Lactation Note (Addendum)
This note was copied from a baby's chart. Lactation Consultation Note  Patient Name: Boy Aeliana Spates DXIPJ'A Date: 10/28/2019   Baby 76 hours old.  [redacted]w[redacted]d. 10% weight loss.  Stools color transitioning.   Mother can easily express colostrum. Set up DEBP and suggest mother give supplemental breastmilk with each feeding. Suggest mother call the next time she feeds for LC to view feeding. Recommend mother post pump 4-6 times per day for 15 min with DEBP on initiation setting. Give baby back volume pumped at the next feeding. Reviewed cleaning and milk storage.   Observed feeding with intermittent swallows.  BS 49. Kathryne Hitch. RN gave glucose gel. Discussed with mother options for supplementation due to low BS & weight loss. Mother states she preferred now to use formula and would prefer to give baby donor breast milk.  Dr. Earle Gell arrived during consult and put in special order for Frozen DM. Encouraged mother to continuing post pumping and hand expressing, giving baby back volume.        Maternal Data    Feeding Feeding Type: Breast Fed  LATCH Score                   Interventions    Lactation Tools Discussed/Used     Consult Status      Carlye Grippe 10/28/2019, 8:23 AM

## 2019-10-28 NOTE — Progress Notes (Signed)
SW made a [facility] CPS report for infant's positive UDS for THC.  Report was made to intake worker Sandy Salaam. CPS will follow-up with family within in 65 hours.   There are no barriers to discharge.   Laurey Arrow, MSW, LCSW Clinical Social Work [phone]

## 2019-10-29 ENCOUNTER — Ambulatory Visit: Payer: Self-pay

## 2019-10-29 NOTE — Lactation Note (Addendum)
This note was copied from a baby's chart. Lactation Consultation Note  Patient Name: Boy Larry Knipp DGLOV'F Date: 10/29/2019   Baby 34 hours old in NICU. Weight increased.  Baby has been receiving Medolac donor breastmilk.  Mother also latched and pumped last night.   Mother's breasts are filling.  Encouraged her to keep pumping at least q 3 hours. Mother states she has pump at home.  Discussed converting pumping kit. She also has manual pump. Reviewed engorgement care and monitoring voids/stools. Suggest mother call if she needs further assistance.  Feed on demand with cues.  Goal 8-12+ times per day after first 24 hrs.  Place baby STS if not cueing. Wake baby for feedings if needed.        Maternal Data    Feeding    LATCH Score                   Interventions    Lactation Tools Discussed/Used     Consult Status      Carlye Grippe 10/29/2019, 9:16 AM

## 2019-11-01 ENCOUNTER — Inpatient Hospital Stay (HOSPITAL_COMMUNITY)
Admission: AD | Admit: 2019-11-01 | Discharge: 2019-11-01 | Disposition: A | Discharge status: Home / Self Care | Payer: Medicaid Other | Attending: Obstetrics and Gynecology | Admitting: Obstetrics and Gynecology

## 2019-11-01 ENCOUNTER — Encounter (HOSPITAL_COMMUNITY): Payer: Self-pay

## 2019-11-01 ENCOUNTER — Other Ambulatory Visit: Payer: Self-pay

## 2019-11-01 DIAGNOSIS — Z880 Allergy status to penicillin: Secondary | ICD-10-CM | POA: Diagnosis not present

## 2019-11-01 DIAGNOSIS — Z9889 Other specified postprocedural states: Secondary | ICD-10-CM

## 2019-11-01 DIAGNOSIS — Z888 Allergy status to other drugs, medicaments and biological substances status: Secondary | ICD-10-CM | POA: Diagnosis not present

## 2019-11-01 DIAGNOSIS — O9953 Diseases of the respiratory system complicating the puerperium: Secondary | ICD-10-CM | POA: Diagnosis not present

## 2019-11-01 DIAGNOSIS — Z809 Family history of malignant neoplasm, unspecified: Secondary | ICD-10-CM | POA: Diagnosis not present

## 2019-11-01 DIAGNOSIS — Z882 Allergy status to sulfonamides status: Secondary | ICD-10-CM | POA: Diagnosis not present

## 2019-11-01 DIAGNOSIS — L03316 Cellulitis of umbilicus: Secondary | ICD-10-CM

## 2019-11-01 DIAGNOSIS — O99335 Smoking (tobacco) complicating the puerperium: Secondary | ICD-10-CM | POA: Insufficient documentation

## 2019-11-01 DIAGNOSIS — Z91018 Allergy to other foods: Secondary | ICD-10-CM | POA: Diagnosis not present

## 2019-11-01 DIAGNOSIS — F17210 Nicotine dependence, cigarettes, uncomplicated: Secondary | ICD-10-CM | POA: Insufficient documentation

## 2019-11-01 DIAGNOSIS — Z881 Allergy status to other antibiotic agents status: Secondary | ICD-10-CM | POA: Diagnosis not present

## 2019-11-01 DIAGNOSIS — Z91030 Bee allergy status: Secondary | ICD-10-CM | POA: Insufficient documentation

## 2019-11-01 DIAGNOSIS — J45909 Unspecified asthma, uncomplicated: Secondary | ICD-10-CM | POA: Diagnosis not present

## 2019-11-01 DIAGNOSIS — O8601 Infection of obstetric surgical wound, superficial incisional site: Secondary | ICD-10-CM | POA: Insufficient documentation

## 2019-11-01 DIAGNOSIS — Z4889 Encounter for other specified surgical aftercare: Secondary | ICD-10-CM

## 2019-11-01 DIAGNOSIS — O34219 Maternal care for unspecified type scar from previous cesarean delivery: Secondary | ICD-10-CM | POA: Insufficient documentation

## 2019-11-01 DIAGNOSIS — Z98891 History of uterine scar from previous surgery: Secondary | ICD-10-CM

## 2019-11-01 MED ORDER — CLINDAMYCIN HCL 300 MG PO CAPS: 300.0000 mg | ORAL_CAPSULE | Freq: Four times a day (QID) | ORAL | 0 refills | Status: DC

## 2019-11-01 NOTE — MAU Provider Note (Signed)
History     CSN: [id number]  Arrival date and time: 11/01/19 1657   First Provider Initiated Contact with Patient 11/01/19 1759      Chief Complaint  Patient presents with  . Wound Check   Loretta Moore is a 23 y.o. G2P2002 at 6 days postpartum who receives care at [facility].  She presents today for Wound Check.  She states she delivered on November 21st and has been having pain, swelling, and odor from her incisional site.  She states that her bandage is still in place and she has not noticed any leakage.  She states she is unable to see the bandage or the incision site because "my stomach is really really swollen tight."  She reports taking oxycodone and ibuprofen for pain and her last dose was between 1 and 2pm.       OB History    Gravida  2   Para  2   Term  2   Preterm      AB      Living  2     SAB      TAB      Ectopic      Multiple  0   Live Births  2           Past Medical History:  Diagnosis Date  . Anemia   . Asthma   . Hard of hearing   . Hidradenitis suppurativa    had excisions in axilla and groin    Past Surgical History:  Procedure Laterality Date  . AXILLARY HIDRADENITIS EXCISION    . CESAREAN SECTION N/A 08/29/2015   Procedure: CESAREAN SECTION;  Surgeon: Levie Heritage, DO;  Location: [facility];  Service: Obstetrics;  Laterality: N/A;  . CESAREAN SECTION N/A 10/26/2019   Procedure: REPEAT CESAREAN SECTION;  Surgeon: Lazaro Arms, MD;  Location: [facility];  Service: Obstetrics;  Laterality: N/A;  . HYDRADENITIS EXCISION Bilateral 01/29/2019   Procedure: Excision Hidradentitis groin (right) and right and left flank;  Surgeon: Harriette Bouillon, MD;  Location: [facility];  Service: General;  Laterality: Bilateral;  . INGUINAL HIDRADENITIS EXCISION    . TONSILLECTOMY AND ADENOIDECTOMY      Family History  Problem Relation Age of Onset  . Anemia Mother   . Hypertension Mother   . Other Mother        hidradenitis  .  Epilepsy Father   . Hypertension Father   . Other Sister        hidradenitis  . Epilepsy Brother   . Other Paternal Grandmother        aneursym  . Cancer Paternal Grandfather   . Hypertension Paternal Grandfather     Social History   Tobacco Use  . Smoking status: Current Every Day Smoker    Packs/day: 0.50    Years: 3.00    Pack years: 1.50    Types: Cigarettes    Last attempt to quit: 06/02/2015    Years since quitting: 4.4  . Smokeless tobacco: Never Used  Substance Use Topics  . Alcohol use: Not Currently    Alcohol/week: 0.0 standard drinks    Comment: on weekends  . Drug use: No    Allergies:  Allergies  Allergen Reactions  . Bee Venom Anaphylaxis  . Amoxicillin Hives  . Augmentin [Amoxicillin-Pot Clavulanate] Hives  . Ceclor [Cefaclor] Hives  . Penicillins Hives    Has patient had a PCN reaction causing immediate rash, facial/tongue/throat swelling, SOB or  lightheadedness with hypotension: Yes Has patient had a PCN reaction causing severe rash involving mucus membranes or skin necrosis: No Has patient had a PCN reaction that required hospitalization No Has patient had a PCN reaction occurring within the last 10 years: No If all of the above answers are "NO", then may proceed with Cephalosporin use.   . Sulfa Antibiotics Hives  . Tomato Hives  . Other Rash    Paper tape causes rash    Medications Prior to Admission  Medication Sig Dispense Refill Last Dose  . ibuprofen (ADVIL) 800 MG tablet Take 1 tablet (800 mg total) by mouth every 8 (eight) hours. 30 tablet 0 11/01/2019 at Unknown time  . oxyCODONE (ROXICODONE) 5 MG immediate release tablet Take 1 tablet (5 mg total) by mouth every 8 (eight) hours as needed. 20 tablet 0 11/01/2019 at Unknown time  . prenatal vitamin w/FE, FA (PRENATAL 1 + 1) 27-1 MG TABS tablet Take 1 tablet by mouth daily at 12 noon. 30 each 12 11/01/2019 at Unknown time  . acetaminophen (TYLENOL) 500 MG tablet Take 500 mg by mouth every  6 (six) hours as needed for mild pain or headache.     . albuterol (PROVENTIL) (2.5 MG/3ML) 0.083% nebulizer solution Take 2.5 mg by nebulization every 6 (six) hours as needed for wheezing or shortness of breath.     Marland Kitchen albuterol (VENTOLIN HFA) 108 (90 Base) MCG/ACT inhaler Inhale 1-2 puffs into the lungs every 6 (six) hours as needed for wheezing or shortness of breath. (Patient not taking: Reported on 10/23/2019) 1 g 1     Review of Systems  Constitutional: Negative for chills and fever.  Respiratory: Negative for cough and shortness of breath.   Gastrointestinal: Positive for abdominal pain and nausea. Negative for constipation, diarrhea and vomiting.  Genitourinary: Positive for vaginal bleeding. Negative for difficulty urinating, dysuria, pelvic pain (Sometimes, but not currently.) and vaginal discharge.  Neurological: Negative for dizziness, light-headedness and headaches.   Physical Exam   Blood pressure 120/60, pulse 84, temperature 99.1 F (37.3 C), temperature source Oral, resp. rate 18, last menstrual period 01/25/2019, SpO2 98 %, unknown if currently breastfeeding.  Physical Exam  Constitutional: She is oriented to person, place, and time. No distress.  HENT:  Head: Normocephalic and atraumatic.  Eyes: Conjunctivae are normal.  Neck: Normal range of motion.  Cardiovascular: Normal rate, regular rhythm and normal heart sounds.  Respiratory: Effort normal and breath sounds normal.  GI: Soft. Bowel sounds are normal. There is abdominal tenderness in the periumbilical area.  Lower Abdomen: Petechiae noted.  Tender to touch. LT Abdominal Incision: Honeycomb dressing brown and saturated-Removed with apparent malodor. Incision with some redness and scant, but whitish yellow pustular discharge noted.   Musculoskeletal: Normal range of motion.        General: Edema (BLE) present.  Neurological: She is alert and oriented to person, place, and time.  Skin: Skin is dry and intact.  Petechiae noted.     Psychiatric: She has a normal mood and affect. Her behavior is normal.    MAU Course  Procedures Saturated honeycomb removed. Incision cleaned with betadine x3.  Steri strips applied to incision.  MDM Wound Care  Assessment and Plan  23 year old S/P Repeat C/S Cellulitis   -POC discussed. -Wound cleaned as above. -Patient informed that due to state of incision and low grade fever, it is necessary to treat with antibiotics. -Patient reports allergies to several antibiotics and states she has only taken  clindamycin. -Discussed usage of clindamycin 300 QID x 5 days. -Instructed to keep scheduled appt for Monday. -Incision care instructions given including cleaning of incisional site, symptoms to report, and anticipated healing time. -Patient given a pack of 4x4 gauze and instructed to utilize to keep area clean and dry.  -Patient without questions or concerns. -Patient declines pain medication at current and states she will take next dose when she gets home.  -Encouraged to call or return to MAU if symptoms worsen or with the onset of new symptoms. -Discharged to home in stable condition.  Cherre RobinsJessica L Devota Viruet, MSN, CNM 11/01/2019, 5:59 PM

## 2019-11-01 NOTE — Discharge Instructions (Signed)

## 2019-11-01 NOTE — MAU Note (Signed)
Pt s/p c-section on 11/21. She states the incision is painful, red, swollen, warm and has an odor. Does not notice any drainage. Denies any fever. Says her pain medication isn't working.

## 2019-11-04 ENCOUNTER — Encounter: Payer: Self-pay | Admitting: Advanced Practice Midwife

## 2019-11-04 ENCOUNTER — Ambulatory Visit (INDEPENDENT_AMBULATORY_CARE_PROVIDER_SITE_OTHER): Payer: Medicaid Other | Admitting: Advanced Practice Midwife

## 2019-11-04 ENCOUNTER — Other Ambulatory Visit: Payer: Self-pay

## 2019-11-04 VITALS — BP 144/88 | HR 87 | Ht 60.0 in | Wt 237.0 lb

## 2019-11-04 DIAGNOSIS — T8149XA Infection following a procedure, other surgical site, initial encounter: Secondary | ICD-10-CM

## 2019-11-04 DIAGNOSIS — Z98891 History of uterine scar from previous surgery: Secondary | ICD-10-CM

## 2019-11-04 NOTE — Progress Notes (Signed)
Family [facility] Clinic Visit  Patient name: Loretta Moore MRN [id number]  Date of birth: 01/24/1996  CC & HPI:  Loretta Moore is a 23 y.o. African American female presenting today for incision check. She has a CS 2 weeks ago, went to MAU a few days ago d/t odor and drainage and pain around incision . Given clindamycin (breastfeeding) and dx w/cellulits/infection.   Pertinent History Reviewed:  Medical & Surgical Hx:   Past Medical History:  Diagnosis Date  . Anemia   . Asthma   . Hard of hearing   . Hidradenitis suppurativa    had excisions in axilla and groin   Past Surgical History:  Procedure Laterality Date  . AXILLARY HIDRADENITIS EXCISION    . CESAREAN SECTION N/A 08/29/2015   Procedure: CESAREAN SECTION;  Surgeon: Levie Heritage, DO;  Location: [facility];  Service: Obstetrics;  Laterality: N/A;  . CESAREAN SECTION N/A 10/26/2019   Procedure: REPEAT CESAREAN SECTION;  Surgeon: Lazaro Arms, MD;  Location: [facility];  Service: Obstetrics;  Laterality: N/A;  . HYDRADENITIS EXCISION Bilateral 01/29/2019   Procedure: Excision Hidradentitis groin (right) and right and left flank;  Surgeon: Harriette Bouillon, MD;  Location: [facility];  Service: General;  Laterality: Bilateral;  . INGUINAL HIDRADENITIS EXCISION    . TONSILLECTOMY AND ADENOIDECTOMY     Family History  Problem Relation Age of Onset  . Anemia Mother   . Hypertension Mother   . Other Mother        hidradenitis  . Epilepsy Father   . Hypertension Father   . Other Sister        hidradenitis  . Epilepsy Brother   . Other Paternal Grandmother        aneursym  . Cancer Paternal Grandfather   . Hypertension Paternal Grandfather     Current Outpatient Medications:  .  albuterol (PROVENTIL) (2.5 MG/3ML) 0.083% nebulizer solution, Take 2.5 mg by nebulization every 6 (six) hours as needed for wheezing or shortness of breath., Disp: , Rfl:  .  albuterol (VENTOLIN HFA) 108 (90 Base) MCG/ACT inhaler,  Inhale 1-2 puffs into the lungs every 6 (six) hours as needed for wheezing or shortness of breath., Disp: 1 g, Rfl: 1 .  clindamycin (CLEOCIN) 300 MG capsule, Take 1 capsule (300 mg total) by mouth 4 (four) times daily., Disp: 20 capsule, Rfl: 0 .  ibuprofen (ADVIL) 800 MG tablet, Take 1 tablet (800 mg total) by mouth every 8 (eight) hours., Disp: 30 tablet, Rfl: 0 .  oxyCODONE (ROXICODONE) 5 MG immediate release tablet, Take 1 tablet (5 mg total) by mouth every 8 (eight) hours as needed., Disp: 20 tablet, Rfl: 0 .  prenatal vitamin w/FE, FA (PRENATAL 1 + 1) 27-1 MG TABS tablet, Take 1 tablet by mouth daily at 12 noon., Disp: 30 each, Rfl: 12 Social History: Reviewed -  reports that she has been smoking cigarettes. She has a 1.50 pack-year smoking history. She has never used smokeless tobacco.  Review of Systems:   Constitutional: Negative for fever and chills Eyes: Negative for visual disturbances Respiratory: Negative for shortness of breath, dyspnea Cardiovascular: Negative for chest pain or palpitations  Gastrointestinal: Negative for vomiting, diarrhea and constipation; no abdominal pain other than normal post op Genitourinary: Negative for dysuria and urgency, vaginal irritation or itching Musculoskeletal: Negative for back pain, joint pain, myalgias  Neurological: Negative for dizziness and headaches    Objective Findings:    Physical Examination: Vitals:  11/04/19 1136  BP: (!) 144/88  Pulse: 87   General appearance - well appearing, and in no distress Mental status - alert, oriented to person, place, and time Chest:  Normal respiratory effort Heart - normal rate and regular rhythm Abdomen:  Soft, appropriately tender.  Co exam w/LHE.  Bruising and some edema, wound edges non erythemas, no drainage. Steri strips replaced.  Musculoskeletal:  Normal range of motion without pain Extremities:  No edema    No results found for this or any previous visit (from the past 24  hour(s)).    Assessment & Plan:  A:   Post op wound infection, healing well P:  Plans NFP, has pp visit 12/28.  Doesn't have reliable BP cuff, so will need to come in for that    No follow-ups on file.  Christin Fudge CNM 11/04/2019 12:13 PM

## 2019-12-02 ENCOUNTER — Other Ambulatory Visit: Payer: Self-pay

## 2019-12-02 ENCOUNTER — Encounter: Payer: Self-pay | Admitting: Women's Health

## 2019-12-02 ENCOUNTER — Ambulatory Visit (INDEPENDENT_AMBULATORY_CARE_PROVIDER_SITE_OTHER): Payer: Medicaid Other | Admitting: Women's Health

## 2019-12-02 DIAGNOSIS — Z1289 Encounter for screening for malignant neoplasm of other sites: Secondary | ICD-10-CM

## 2019-12-02 NOTE — Progress Notes (Signed)
POSTPARTUM VISIT Patient name: Loretta Moore MRN [id number]  Date of birth: August 06, 1996 Chief Complaint:   Postpartum Care  History of Present Illness:   Loretta Moore is a 23 y.o. G71P2002 African American female being seen today for a postpartum visit. She is 5 weeks postpartum following a repeat cesarean section, low transverse incision at 39.1 gestational weeks. Anesthesia: spinal. Laceration: n/a. I have fully reviewed the prenatal and intrapartum course. Pregnancy uncomplicated. Postpartum course has been complicated by incisional cellulitis s/p clindamycin tx. Bleeding no bleeding. Bowel function is normal. Bladder function is normal.  Patient is sexually active. Last sexual activity: couple of days ago, sharp pain after sex, better now.  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    Last pap 04/30/19.  Results were normal .  No LMP recorded.  Baby's course has been uncomplicated. Baby is feeding by breast & bottle    Edinburgh Postpartum Depression Screening: negative Edinburgh Postnatal Depression Scale - 12/02/19 1341      Edinburgh Postnatal Depression Scale:  In the Past 7 Days   I have been able to laugh and see the funny side of things.  0    I have looked forward with enjoyment to things.  0    I have blamed myself unnecessarily when things went wrong.  1    I have been anxious or worried for no good reason.  0    I have felt scared or panicky for no good reason.  0    Things have been getting on top of me.  0    I have been so unhappy that I have had difficulty sleeping.  0    I have felt sad or miserable.  0    I have been so unhappy that I have been crying.  0    The thought of harming myself has occurred to me.  0    Edinburgh Postnatal Depression Scale Total  1      Review of Systems:   Pertinent items are noted in HPI Denies Abnormal vaginal discharge w/  itching/odor/irritation, headaches, visual changes, shortness of breath, chest pain, abdominal pain, severe nausea/vomiting, or problems with urination or bowel movements. Pertinent History Reviewed:  Reviewed past medical,surgical, obstetrical and family history.  Reviewed problem list, medications and allergies. OB History  Gravida Para Term Preterm AB Living  2 2 2     2   SAB TAB Ectopic Multiple Live Births        0 2    # Outcome Date GA Lbr Len/2nd Weight Sex Delivery Anes PTL Lv  2 Term 10/26/19 [redacted]w[redacted]d  6 lb 13 oz (3.09 kg) M CS-Vac Spinal  LIV  1 Term 08/29/15 [redacted]w[redacted]d  7 lb 6.2 oz (3.351 kg) M CS-LTranv EPI N LIV     Birth Comments: Hgb, Normal, FA Newborn Screen Barcode: [redacted]w[redacted]d Date Collected: 08/30/2015     Complications: Fetal Intolerance   Physical Assessment:   Vitals:   12/02/19 1335  BP: 115/73  Pulse: 79  Weight: 235 lb 9.6 oz (106.9 kg)  Height: 5' (1.524 m)  Body mass index is 46.01 kg/m.       Physical Examination:   General appearance: alert, well appearing, and in no distress  Mental status: alert, oriented to person, place, and time  Skin: warm & dry   Cardiovascular: normal heart rate noted   Respiratory: normal  respiratory effort, no distress   Breasts: deferred, no complaints   Abdomen: soft, non-tender, c/s incision healing well, suture hanging from Rt side removed, Lt side healing more inward, pt concerned about appearance- discussed possible panniculectomy/resection of old incisions at time of c/s w/ future pregnancy she wants  Back: no active hidradenitis boils  Pelvic: examination not indicated  Rectal: not examined  Extremities: no edema       No results found for this or any previous visit (from the past 24 hour(s)).  Assessment & Plan:  1) Postpartum exam 2) 5 wks s/p RCS 3) Breast & bottlefeeding 4) Depression screening 5) Contraception counseling, pt prefers condoms  Meds: No orders of the defined types were placed in this  encounter.   Follow-up: Return in about 1 year (around 12/01/2020) for Physical.   No orders of the defined types were placed in this encounter.   [place], [facility] 12/02/2019 2:02 PM

## 2019-12-02 NOTE — Patient Instructions (Signed)
Tips To Increase Milk Supply  Lots of water! Enough so that your urine is clear  Plenty of calories, if you're not getting enough calories, your milk supply can decrease  Breastfeed/pump often, every 2-3 hours x 20-30mins  Fenugreek 3 pills 3 times a day, this may make your urine smell like maple syrup  Mother's Milk Tea  Lactation cookies, google for the recipe  Real oatmeal  Body Armor sports drinks  

## 2020-02-04 DIAGNOSIS — N764 Abscess of vulva: Secondary | ICD-10-CM | POA: Diagnosis not present

## 2020-02-04 DIAGNOSIS — N939 Abnormal uterine and vaginal bleeding, unspecified: Secondary | ICD-10-CM | POA: Diagnosis not present

## 2020-02-04 DIAGNOSIS — Z3202 Encounter for pregnancy test, result negative: Secondary | ICD-10-CM | POA: Diagnosis not present

## 2020-02-05 ENCOUNTER — Ambulatory Visit: Payer: Medicaid Other | Admitting: Advanced Practice Midwife

## 2020-02-28 DIAGNOSIS — L0231 Cutaneous abscess of buttock: Secondary | ICD-10-CM | POA: Diagnosis not present

## 2020-04-03 DIAGNOSIS — Z79899 Other long term (current) drug therapy: Secondary | ICD-10-CM | POA: Diagnosis not present

## 2020-04-03 DIAGNOSIS — L089 Local infection of the skin and subcutaneous tissue, unspecified: Secondary | ICD-10-CM | POA: Diagnosis not present

## 2020-04-03 DIAGNOSIS — L732 Hidradenitis suppurativa: Secondary | ICD-10-CM | POA: Diagnosis not present

## 2020-04-03 DIAGNOSIS — Z5181 Encounter for therapeutic drug level monitoring: Secondary | ICD-10-CM | POA: Diagnosis not present

## 2020-04-19 DIAGNOSIS — L732 Hidradenitis suppurativa: Secondary | ICD-10-CM | POA: Diagnosis not present

## 2020-04-19 DIAGNOSIS — N764 Abscess of vulva: Secondary | ICD-10-CM | POA: Diagnosis not present

## 2020-04-30 DIAGNOSIS — L732 Hidradenitis suppurativa: Secondary | ICD-10-CM | POA: Diagnosis not present

## 2020-04-30 DIAGNOSIS — Z8614 Personal history of Methicillin resistant Staphylococcus aureus infection: Secondary | ICD-10-CM | POA: Diagnosis not present

## 2020-04-30 DIAGNOSIS — R6883 Chills (without fever): Secondary | ICD-10-CM | POA: Diagnosis not present

## 2020-04-30 DIAGNOSIS — L02211 Cutaneous abscess of abdominal wall: Secondary | ICD-10-CM | POA: Diagnosis not present

## 2020-04-30 DIAGNOSIS — R61 Generalized hyperhidrosis: Secondary | ICD-10-CM | POA: Diagnosis not present

## 2020-05-20 DIAGNOSIS — L732 Hidradenitis suppurativa: Secondary | ICD-10-CM | POA: Diagnosis not present

## 2020-06-23 DIAGNOSIS — R509 Fever, unspecified: Secondary | ICD-10-CM | POA: Diagnosis not present

## 2020-06-23 DIAGNOSIS — J019 Acute sinusitis, unspecified: Secondary | ICD-10-CM | POA: Diagnosis not present

## 2020-06-23 DIAGNOSIS — R519 Headache, unspecified: Secondary | ICD-10-CM | POA: Diagnosis not present

## 2020-06-23 DIAGNOSIS — R062 Wheezing: Secondary | ICD-10-CM | POA: Diagnosis not present

## 2020-06-23 DIAGNOSIS — J329 Chronic sinusitis, unspecified: Secondary | ICD-10-CM | POA: Diagnosis not present

## 2020-08-18 DIAGNOSIS — L732 Hidradenitis suppurativa: Secondary | ICD-10-CM | POA: Diagnosis not present

## 2020-08-18 DIAGNOSIS — L0889 Other specified local infections of the skin and subcutaneous tissue: Secondary | ICD-10-CM | POA: Diagnosis not present

## 2020-08-18 DIAGNOSIS — L089 Local infection of the skin and subcutaneous tissue, unspecified: Secondary | ICD-10-CM | POA: Diagnosis not present

## 2020-09-09 DIAGNOSIS — Z029 Encounter for administrative examinations, unspecified: Secondary | ICD-10-CM

## 2020-11-05 DIAGNOSIS — L732 Hidradenitis suppurativa: Secondary | ICD-10-CM | POA: Diagnosis not present

## 2020-11-05 DIAGNOSIS — R222 Localized swelling, mass and lump, trunk: Secondary | ICD-10-CM | POA: Diagnosis not present

## 2020-11-16 ENCOUNTER — Telehealth: Payer: Self-pay | Admitting: Women's Health

## 2020-11-16 NOTE — Telephone Encounter (Signed)
LMOVM that OTC medications are typically not advised in the first trimester.  Advised to push fluids, try saline nasal spray, or humidifier.

## 2020-11-18 ENCOUNTER — Other Ambulatory Visit (INDEPENDENT_AMBULATORY_CARE_PROVIDER_SITE_OTHER): Payer: Medicaid Other | Admitting: *Deleted

## 2020-11-18 ENCOUNTER — Other Ambulatory Visit: Payer: Self-pay

## 2020-11-18 ENCOUNTER — Telehealth: Payer: Self-pay | Admitting: Obstetrics & Gynecology

## 2020-11-18 DIAGNOSIS — N926 Irregular menstruation, unspecified: Secondary | ICD-10-CM

## 2020-11-18 LAB — POCT URINE PREGNANCY: Preg Test, Ur: POSITIVE — AB

## 2020-11-18 NOTE — Telephone Encounter (Signed)
Pt requesting Rx for hand brace, hands go numb while sleeping, both hands, hasn't seen PCP in a long time per pt  Please advise & notify pt   [facility]

## 2020-11-18 NOTE — Progress Notes (Signed)
   NURSE VISIT- PREGNANCY CONFIRMATION   SUBJECTIVE:  Loretta Moore is a 24 y.o. G56P2002 female at [redacted]w[redacted]d by uncertain LMP of Patient's last menstrual period was 10/10/2020. Here for pregnancy confirmation.  Home pregnancy test: positive x 2  She reports no complaints.  She is taking prenatal vitamins.    OBJECTIVE:  LMP 10/10/2020   Breastfeeding No   Appears well, in no apparent distress OB History  Gravida Para Term Preterm AB Living  3 2 2     2   SAB IAB Ectopic Multiple Live Births        0 2    # Outcome Date GA Lbr Len/2nd Weight Sex Delivery Anes PTL Lv  3 Current           2 Term 10/26/19 [redacted]w[redacted]d  6 lb 13 oz (3.09 kg) M CS-Vac Spinal  LIV  1 Term 08/29/15 [redacted]w[redacted]d  7 lb 6.2 oz (3.351 kg) M CS-LTranv EPI N LIV     Birth Comments: Hgb, Normal, FA Newborn Screen Barcode: [redacted]w[redacted]d Date Collected: 08/30/2015     Complications: Fetal Intolerance    No results found for this or any previous visit (from the past 24 hour(s)).  ASSESSMENT: Positive pregnancy test, [redacted]w[redacted]d by LMP    PLAN: Schedule for dating ultrasound in 2-3 weeks Prenatal vitamins: continue   Nausea medicines: not currently needed   OB packet given: Yes  [redacted]w[redacted]d  11/18/2020 9:37 AM

## 2020-11-18 NOTE — Progress Notes (Signed)
Chart reviewed for nurse visit. Agree with plan of care.  Adline Potter, NP 11/18/2020 10:09 AM

## 2020-11-19 NOTE — Telephone Encounter (Signed)
Script given

## 2020-11-20 NOTE — Telephone Encounter (Signed)
LMOVM script will be faxed to [facility].

## 2020-11-21 DIAGNOSIS — G5602 Carpal tunnel syndrome, left upper limb: Secondary | ICD-10-CM | POA: Diagnosis not present

## 2020-11-21 DIAGNOSIS — G5601 Carpal tunnel syndrome, right upper limb: Secondary | ICD-10-CM | POA: Diagnosis not present

## 2020-11-24 DIAGNOSIS — L732 Hidradenitis suppurativa: Secondary | ICD-10-CM | POA: Diagnosis not present

## 2020-11-24 DIAGNOSIS — Z3A01 Less than 8 weeks gestation of pregnancy: Secondary | ICD-10-CM | POA: Diagnosis not present

## 2020-11-24 DIAGNOSIS — L02214 Cutaneous abscess of groin: Secondary | ICD-10-CM | POA: Diagnosis not present

## 2020-11-24 DIAGNOSIS — L02212 Cutaneous abscess of back [any part, except buttock]: Secondary | ICD-10-CM | POA: Diagnosis not present

## 2020-11-24 DIAGNOSIS — O99711 Diseases of the skin and subcutaneous tissue complicating pregnancy, first trimester: Secondary | ICD-10-CM | POA: Diagnosis not present

## 2020-11-24 DIAGNOSIS — Z792 Long term (current) use of antibiotics: Secondary | ICD-10-CM | POA: Diagnosis not present

## 2020-11-24 DIAGNOSIS — Z3490 Encounter for supervision of normal pregnancy, unspecified, unspecified trimester: Secondary | ICD-10-CM | POA: Diagnosis not present

## 2020-11-24 DIAGNOSIS — Z881 Allergy status to other antibiotic agents status: Secondary | ICD-10-CM | POA: Diagnosis not present

## 2020-11-24 DIAGNOSIS — Z880 Allergy status to penicillin: Secondary | ICD-10-CM | POA: Diagnosis not present

## 2020-11-24 DIAGNOSIS — Z885 Allergy status to narcotic agent status: Secondary | ICD-10-CM | POA: Diagnosis not present

## 2020-11-24 DIAGNOSIS — Z882 Allergy status to sulfonamides status: Secondary | ICD-10-CM | POA: Diagnosis not present

## 2020-11-24 DIAGNOSIS — L02415 Cutaneous abscess of right lower limb: Secondary | ICD-10-CM | POA: Diagnosis not present

## 2020-11-26 ENCOUNTER — Telehealth: Payer: Self-pay

## 2020-11-26 NOTE — Telephone Encounter (Signed)
Transition Care Management Unsuccessful Follow-up Telephone Call  Date of discharge and from where:  11/25/2020 [facility] ED  Attempts:  1st Attempt  Reason for unsuccessful TCM follow-up call:  Left voice message

## 2020-11-26 NOTE — Telephone Encounter (Signed)
Transition Care Management Follow-up Telephone Call  Date of discharge and from where: 11/25/2020 [facility] ED  How have you been since you were released from the hospital? Doing much better.   Any questions or concerns? No  Items Reviewed:  Did the pt receive and understand the discharge instructions provided? Yes   Medications obtained and verified? Patient is expecting, no medications were given.   Other? No   Any new allergies since your discharge? No   Dietary orders reviewed? Yes  Do you have support at home? Yes     Functional Questionnaire: (I = Independent and D = Dependent) ADLs: I  Bathing/Dressing- I  Meal Prep- I  Eating- I  Maintaining continence- I  Transferring/Ambulation- I  Managing Meds- I  Follow up appointments reviewed:   PCP Hospital f/u appt confirmed? Yes  Scheduled to see Eusebio Me, MD on 12/30/2020 @ 10am.  Specialist [facility] f/u appt confirmed? No    Are transportation arrangements needed? No   If their condition worsens, is the pt aware to call PCP or go to the Emergency Dept.? Yes  Was the patient provided with contact information for the PCP's office or ED? Yes  Was to pt encouraged to call back with questions or concerns? Yes

## 2020-12-09 ENCOUNTER — Other Ambulatory Visit: Payer: Self-pay | Admitting: Obstetrics & Gynecology

## 2020-12-09 DIAGNOSIS — O3680X0 Pregnancy with inconclusive fetal viability, not applicable or unspecified: Secondary | ICD-10-CM

## 2020-12-10 ENCOUNTER — Other Ambulatory Visit: Payer: Self-pay

## 2020-12-10 ENCOUNTER — Ambulatory Visit (INDEPENDENT_AMBULATORY_CARE_PROVIDER_SITE_OTHER): Payer: Medicaid Other

## 2020-12-10 DIAGNOSIS — Z3A08 8 weeks gestation of pregnancy: Secondary | ICD-10-CM

## 2020-12-10 DIAGNOSIS — O3680X0 Pregnancy with inconclusive fetal viability, not applicable or unspecified: Secondary | ICD-10-CM

## 2020-12-10 NOTE — Progress Notes (Signed)
[place] 8+5 wks,single IUP with YS,positive fht 166 bpm,crl 25.79 mm

## 2020-12-30 DIAGNOSIS — L732 Hidradenitis suppurativa: Secondary | ICD-10-CM | POA: Diagnosis not present

## 2020-12-30 DIAGNOSIS — R202 Paresthesia of skin: Secondary | ICD-10-CM | POA: Diagnosis not present

## 2020-12-30 DIAGNOSIS — R200 Anesthesia of skin: Secondary | ICD-10-CM | POA: Diagnosis not present

## 2021-01-04 ENCOUNTER — Other Ambulatory Visit: Payer: Self-pay | Admitting: Obstetrics & Gynecology

## 2021-01-04 DIAGNOSIS — Z3682 Encounter for antenatal screening for nuchal translucency: Secondary | ICD-10-CM

## 2021-01-05 ENCOUNTER — Encounter: Payer: Self-pay | Admitting: Women's Health

## 2021-01-05 ENCOUNTER — Ambulatory Visit (INDEPENDENT_AMBULATORY_CARE_PROVIDER_SITE_OTHER): Payer: Medicaid Other | Admitting: Women's Health

## 2021-01-05 ENCOUNTER — Ambulatory Visit (INDEPENDENT_AMBULATORY_CARE_PROVIDER_SITE_OTHER): Payer: Medicaid Other

## 2021-01-05 ENCOUNTER — Ambulatory Visit: Payer: Medicaid Other | Admitting: *Deleted

## 2021-01-05 ENCOUNTER — Other Ambulatory Visit: Payer: Self-pay

## 2021-01-05 VITALS — BP 139/79 | HR 85 | Wt 265.0 lb

## 2021-01-05 DIAGNOSIS — Z3682 Encounter for antenatal screening for nuchal translucency: Secondary | ICD-10-CM

## 2021-01-05 DIAGNOSIS — Z6841 Body Mass Index (BMI) 40.0 and over, adult: Secondary | ICD-10-CM

## 2021-01-05 DIAGNOSIS — H5789 Other specified disorders of eye and adnexa: Secondary | ICD-10-CM | POA: Insufficient documentation

## 2021-01-05 DIAGNOSIS — Z3490 Encounter for supervision of normal pregnancy, unspecified, unspecified trimester: Secondary | ICD-10-CM | POA: Insufficient documentation

## 2021-01-05 DIAGNOSIS — Z3A12 12 weeks gestation of pregnancy: Secondary | ICD-10-CM

## 2021-01-05 DIAGNOSIS — Z3480 Encounter for supervision of other normal pregnancy, unspecified trimester: Secondary | ICD-10-CM | POA: Diagnosis not present

## 2021-01-05 DIAGNOSIS — Z98891 History of uterine scar from previous surgery: Secondary | ICD-10-CM

## 2021-01-05 DIAGNOSIS — Z3481 Encounter for supervision of other normal pregnancy, first trimester: Secondary | ICD-10-CM

## 2021-01-05 DIAGNOSIS — L732 Hidradenitis suppurativa: Secondary | ICD-10-CM

## 2021-01-05 LAB — POCT URINALYSIS DIPSTICK OB
Blood, UA: NEGATIVE
Glucose, UA: NEGATIVE
Ketones, UA: NEGATIVE
Leukocytes, UA: NEGATIVE
Nitrite, UA: NEGATIVE
POC,PROTEIN,UA: NEGATIVE

## 2021-01-05 MED ORDER — BLOOD PRESSURE MONITOR MISC: 0 refills | Status: DC

## 2021-01-05 MED ORDER — SILVER SULFADIAZINE 1 % EX CREA: 1.0000 "application " | TOPICAL_CREAM | Freq: Two times a day (BID) | CUTANEOUS | 11 refills | Status: DC

## 2021-01-05 MED ORDER — ASPIRIN 81 MG PO TBEC: 81.0000 mg | DELAYED_RELEASE_TABLET | Freq: Every day | ORAL | 6 refills | Status: DC

## 2021-01-05 NOTE — Progress Notes (Signed)
[place] 12+3 wks,measurements c/w dates,NT 1.5 mm,NB present,fhr 158 bpm,posterior placenta,normal ovaries,crl 71.43 mm

## 2021-01-05 NOTE — Progress Notes (Signed)
INITIAL OBSTETRICAL VISIT Patient name: Loretta Moore MRN [id number]  Date of birth: 1996/09/14 Chief Complaint:   Initial Prenatal Visit (Nt/it, numbness in hands/feet)  History of Present Illness:   Loretta Moore is a 25 y.o. G38P2002 African American female at [redacted]w[redacted]d by LMP c/w u/s at 9 weeks with an Estimated Date of Delivery: 07/17/21 being seen today for her initial obstetrical visit.   Her obstetrical history is significant for C/S for NRFHR during IOL for postdates, then elective term RCS.   Today she reports large HS boil Lt labia majora  x 1wk, opened on its own this am, draining. On cleocin x 1wk from [facility] for back and abd boils. Tried Humira prior to pregnancy, made her get more boils.  Cyst below Lt eye since birth of last child- wants removed pp Numbness hands/legs since last baby, states developed HS near epidural site, thinks it could be related Smoker 1ppd down to 6/day, wants to quit Depression screen [facility] 2/9 01/05/2021 04/30/2019  Decreased Interest 0 0  Down, Depressed, Hopeless 0 0  PHQ - 2 Score 0 0  Altered sleeping 0 1  Tired, decreased energy 0 1  Change in appetite 1 1  Feeling bad or failure about yourself  0 0  Trouble concentrating 0 0  Moving slowly or fidgety/restless 1 0  Suicidal thoughts 0 0  PHQ-9 Score 2 3    Patient's last menstrual period was 10/10/2020. Last pap 04/30/19. Results were: NILM w/ HRHPV not done Review of Systems:   Pertinent items are noted in HPI Denies cramping/contractions, leakage of fluid, vaginal bleeding, abnormal vaginal discharge w/ itching/odor/irritation, headaches, visual changes, shortness of breath, chest pain, abdominal pain, severe nausea/vomiting, or problems with urination or bowel movements unless otherwise stated above.  Pertinent History Reviewed:  Reviewed past medical,surgical, social, obstetrical and family history.  Reviewed problem list, medications and allergies. OB History  Gravida Para Term Preterm AB  Living  3 2 2     2   SAB IAB Ectopic Multiple Live Births        0 2    # Outcome Date GA Lbr Len/2nd Weight Sex Delivery Anes PTL Lv  3 Current           2 Term 10/26/19 [redacted]w[redacted]d  6 lb 13 oz (3.09 kg) M CS-Vac Spinal  LIV  1 Term 08/29/15 [redacted]w[redacted]d  7 lb 6.2 oz (3.351 kg) M CS-LTranv EPI N LIV     Birth Comments: Hgb, Normal, FA Newborn Screen Barcode: [redacted]w[redacted]d Date Collected: 08/30/2015     Complications: Fetal Intolerance   Physical Assessment:   Vitals:   01/05/21 1503  BP: 139/79  Pulse: 85  Weight: 265 lb (120.2 kg)  Body mass index is 51.75 kg/m.       Physical Examination:  General appearance - well appearing, and in no distress  Mental status - alert, oriented to person, place, and time  Psych:  She has a normal mood and affect  Skin - warm and dry, normal color, no suspicious lesions noted  Chest - effort normal, all lung fields clear to auscultation bilaterally  Heart - normal rate and regular rhythm  Abdomen - soft, nontender  Extremities:  No swelling or varicosities noted  Thin prep pap is not done  Vulva: Lt labia majora swollen- open area draining, 2 small HS boils Rt groin. Co-exam w/ LHE, recommends silvadene (already on clinda, can't use augmentin or septra as she is allergic-hives)  Chaperone: 03/05/21  TODAY'S NT [place] 12+3 wks,measurements c/w dates,NT 1.5 mm,NB present,fhr 158 bpm,posterior placenta,normal ovaries,crl 71.43 mm,limited view because of c-section scar   Results for orders placed or performed in visit on 01/05/21 (from the past 24 hour(s))  POC Urinalysis Dipstick OB   Collection Time: 01/05/21  3:11 PM  Result Value Ref Range   Color, UA     Clarity, UA     Glucose, UA Negative Negative   Bilirubin, UA     Ketones, UA neg    Spec Grav, UA     Blood, UA neg    pH, UA     POC,PROTEIN,UA Negative Negative, Trace, Small (1+), Moderate (2+), Large (3+), 4+   Urobilinogen, UA     Nitrite, UA neg    Leukocytes, UA Negative Negative    Appearance     Odor      Assessment & Plan:  1) Low-Risk Pregnancy G3P2002 at [redacted]w[redacted]d with an Estimated Date of Delivery: 07/17/21   2) Initial OB visit  3) Severe HS> continue cleocin until finished w/ this course. Rx silvadene to use BID. Warm compresses  4) Smoker>1ppd down to 6/day, wants to quit, [facility] referral sent  5) Prev c/s x 2> for RCS w/ BTL, wants revision of old scar/possible panniculectomy  6) Cyst under Lt eye>wants removal pp  Meds:  Meds ordered this encounter  Medications  . Blood Pressure Monitor MISC    Sig: For regular home bp monitoring during pregnancy    Dispense:  1 each    Refill:  0    Z34.81  . aspirin 81 MG EC tablet    Sig: Take 1 tablet (81 mg total) by mouth daily. Swallow whole.    Dispense:  90 tablet    Refill:  6    Order Specific Question:   Supervising Provider    Answer:   Despina Hidden, LUTHER H [[id number]]  . silver sulfADIAZINE (SILVADENE) 1 % cream    Sig: Apply 1 application topically 2 (two) times daily.    Dispense:  50 g    Refill:  11    Order Specific Question:   Supervising Provider    Answer:   Duane Lope H [[id number]]    Initial labs obtained Continue prenatal vitamins Reviewed n/v relief measures and warning s/s to report Reviewed recommended weight gain based on pre-gravid BMI Encouraged well-balanced diet Genetic & carrier screening discussed: requests Panorama, NT/IT and Horizon 14  Ultrasound discussed; fetal survey: requested CCNC completed> form faxed if has or is planning to apply for medicaid The nature of [facility] [employer] with multiple MDs and other Advanced Practice Providers was explained to patient; also emphasized that fellows, residents, and students are part of our team. Does not have home bp cuff. Rx faxed to [facility]. Check bp weekly, let us know if >140/90.   Indications for ASA therapy (per uptodate)  OR Two or more of the following: Obesity (BMI>30 kg/m2) Yes  Sociodemographic  characteristics (African American race, low socioeconomic level) Yes   Indications for early A1C (per uptodate) BMI >=25 (>=23 in Asian women) AND one of the following High-risk race/ethnicity (eg, African American, Latino, Native American, [place] American, [place] Islander) Yes  Other clinical condition associated with insulin resistance (eg, severe obesity, acanthosis nigricans) Yes   Follow-up: Return in about 3 weeks (around 01/26/2021) for LROB, CNM, MyChart Video.   Orders Placed This Encounter  Procedures  . Urine Culture  . GC/Chlamydia Probe Amp  .  Integrated 1  . Genetic Screening  . Pain Management Screening Profile (10S)  . CBC/D/Plt+RPR+Rh+ABO+Rub Ab...  . Hemoglobin A1c  . POC Urinalysis Dipstick OB    Cheral Marker CNM, [facility] 01/05/2021 3:48 PM

## 2021-01-05 NOTE — Patient Instructions (Signed)
Loretta Moore, I greatly value your feedback.  If you receive a survey following your visit with [place] today, we appreciate you taking the time to fill it out.  Thanks, Joellyn Haff, CNM, WHNP-BC   [facility] ([address] [place], [place] [address]) Entrance C, located off of [address] Free 24/7 valet parking   Nausea & Vomiting  Have saltine crackers or pretzels by your bed and eat a few bites before you raise your head out of bed in the morning  Eat small frequent meals throughout the day instead of large meals  Drink plenty of fluids throughout the day to stay hydrated, just don't drink a lot of fluids with your meals.  This can make your stomach fill up faster making you feel sick  Do not brush your teeth right after you eat  Products with real ginger are good for nausea, like ginger ale and ginger hard candy Make sure it says made with real ginger!  Sucking on sour candy like lemon heads is also good for nausea  If your prenatal vitamins make you nauseated, take them at night so you will sleep through the nausea  Sea Bands  If you feel like you need medicine for the nausea & vomiting please let us know  If you are unable to keep any fluids or food down please let us know   Constipation  Drink plenty of fluid, preferably water, throughout the day  Eat foods high in fiber such as fruits, vegetables, and grains  Exercise, such as walking, is a good way to keep your bowels regular  Drink warm fluids, especially warm prune juice, or decaf coffee  Eat a 1/2 cup of real oatmeal (not instant), 1/2 cup applesauce, and 1/2-1 cup warm prune juice every day  If needed, you may take Colace (docusate sodium) stool softener once or twice a day to help keep the stool soft.   If you still are having problems with constipation, you may take Miralax once daily as needed to help keep your bowels regular.   Home Blood Pressure Monitoring for Patients    Your provider has recommended that you check your blood pressure (BP) at least once a week at home. If you do not have a blood pressure cuff at home, one will be provided for you. Contact your provider if you have not received your monitor within 1 week.   Helpful Tips for Accurate Home Blood Pressure Checks  . Don't smoke, exercise, or drink caffeine 30 minutes before checking your BP . Use the restroom before checking your BP (a full bladder can raise your pressure) . Relax in a comfortable upright chair . Feet on the ground . Left arm resting comfortably on a flat surface at the level of your heart . Legs uncrossed . Back supported . Sit quietly and don't talk . Place the cuff on your bare arm . Adjust snuggly, so that only two fingertips can fit between your skin and the top of the cuff . Check 2 readings separated by at least one minute . Keep a log of your BP readings . For a visual, please reference this diagram: http://ccnc.care/bpdiagram  Provider Name: [facility]     Phone: [phone]  Zone 1: ALL CLEAR  Continue to monitor your symptoms:  . BP reading is less than 140 (top number) or less than 90 (bottom number)  . No right upper stomach pain . No headaches or seeing  spots . No feeling nauseated or throwing up . No swelling in face and hands  Zone 2: CAUTION Call your doctor's office for any of the following:  . BP reading is greater than 140 (top number) or greater than 90 (bottom number)  . Stomach pain under your ribs in the middle or right side . Headaches or seeing spots . Feeling nauseated or throwing up . Swelling in face and hands  Zone 3: EMERGENCY  Seek immediate medical care if you have any of the following:  . BP reading is greater than160 (top number) or greater than 110 (bottom number) . Severe headaches not improving with Tylenol . Serious difficulty catching your breath . Any worsening symptoms from Zone 2    First Trimester of  Pregnancy The first trimester of pregnancy is from week 1 until the end of week 12 (months 1 through 3). A week after a sperm fertilizes an egg, the egg will implant on the wall of the uterus. This embryo will begin to develop into a baby. Genes from you and your partner are forming the baby. The female genes determine whether the baby is a boy or a girl. At 6-8 weeks, the eyes and face are formed, and the heartbeat can be seen on ultrasound. At the end of 12 weeks, all the baby's organs are formed.  Now that you are pregnant, you will want to do everything you can to have a healthy baby. Two of the most important things are to get good prenatal care and to follow your health care provider's instructions. Prenatal care is all the medical care you receive before the baby's birth. This care will help prevent, find, and treat any problems during the pregnancy and childbirth. BODY CHANGES Your body goes through many changes during pregnancy. The changes vary from woman to woman.   You may gain or lose a couple of pounds at first.  You may feel sick to your stomach (nauseous) and throw up (vomit). If the vomiting is uncontrollable, call your health care provider.  You may tire easily.  You may develop headaches that can be relieved by medicines approved by your health care provider.  You may urinate more often. Painful urination may mean you have a bladder infection.  You may develop heartburn as a result of your pregnancy.  You may develop constipation because certain hormones are causing the muscles that push waste through your intestines to slow down.  You may develop hemorrhoids or swollen, bulging veins (varicose veins).  Your breasts may begin to grow larger and become tender. Your nipples may stick out more, and the tissue that surrounds them (areola) may become darker.  Your gums may bleed and may be sensitive to brushing and flossing.  Dark spots or blotches (chloasma, mask of pregnancy)  may develop on your face. This will likely fade after the baby is born.  Your menstrual periods will stop.  You may have a loss of appetite.  You may develop cravings for certain kinds of food.  You may have changes in your emotions from day to day, such as being excited to be pregnant or being concerned that something may go wrong with the pregnancy and baby.  You may have more vivid and strange dreams.  You may have changes in your hair. These can include thickening of your hair, rapid growth, and changes in texture. Some women also have hair loss during or after pregnancy, or hair that feels dry or thin. Your hair  will most likely return to normal after your baby is born. WHAT TO EXPECT AT YOUR PRENATAL VISITS During a routine prenatal visit:  You will be weighed to make sure you and the baby are growing normally.  Your blood pressure will be taken.  Your abdomen will be measured to track your baby's growth.  The fetal heartbeat will be listened to starting around week 10 or 12 of your pregnancy.  Test results from any previous visits will be discussed. Your health care provider may ask you:  How you are feeling.  If you are feeling the baby move.  If you have had any abnormal symptoms, such as leaking fluid, bleeding, severe headaches, or abdominal cramping.  If you have any questions. Other tests that may be performed during your first trimester include:  Blood tests to find your blood type and to check for the presence of any previous infections. They will also be used to check for low iron levels (anemia) and Rh antibodies. Later in the pregnancy, blood tests for diabetes will be done along with other tests if problems develop.  Urine tests to check for infections, diabetes, or protein in the urine.  An ultrasound to confirm the proper growth and development of the baby.  An amniocentesis to check for possible genetic problems.  Fetal screens for spina bifida and  Down syndrome.  You may need other tests to make sure you and the baby are doing well. HOME CARE INSTRUCTIONS  Medicines  Follow your health care provider's instructions regarding medicine use. Specific medicines may be either safe or unsafe to take during pregnancy.  Take your prenatal vitamins as directed.  If you develop constipation, try taking a stool softener if your health care provider approves. Diet  Eat regular, well-balanced meals. Choose a variety of foods, such as meat or vegetable-based protein, fish, milk and low-fat dairy products, vegetables, fruits, and whole grain breads and cereals. Your health care provider will help you determine the amount of weight gain that is right for you.  Avoid raw meat and uncooked cheese. These carry germs that can cause birth defects in the baby.  Eating four or five small meals rather than three large meals a day may help relieve nausea and vomiting. If you start to feel nauseous, eating a few soda crackers can be helpful. Drinking liquids between meals instead of during meals also seems to help nausea and vomiting.  If you develop constipation, eat more high-fiber foods, such as fresh vegetables or fruit and whole grains. Drink enough fluids to keep your urine clear or pale yellow. Activity and Exercise  Exercise only as directed by your health care provider. Exercising will help you:  Control your weight.  Stay in shape.  Be prepared for labor and delivery.  Experiencing pain or cramping in the lower abdomen or low back is a good sign that you should stop exercising. Check with your health care provider before continuing normal exercises.  Try to avoid standing for long periods of time. Move your legs often if you must stand in one place for a long time.  Avoid heavy lifting.  Wear low-heeled shoes, and practice good posture.  You may continue to have sex unless your health care provider directs you otherwise. Relief of Pain  or Discomfort  Wear a good support bra for breast tenderness.    Take warm sitz baths to soothe any pain or discomfort caused by hemorrhoids. Use hemorrhoid cream if your health care provider approves.  Rest with your legs elevated if you have leg cramps or low back pain.  If you develop varicose veins in your legs, wear support Moore. Elevate your feet for 15 minutes, 3-4 times a day. Limit salt in your diet. Prenatal Care  Schedule your prenatal visits by the twelfth week of pregnancy. They are usually scheduled monthly at first, then more often in the last 2 months before delivery.  Write down your questions. Take them to your prenatal visits.  Keep all your prenatal visits as directed by your health care provider. Safety  Wear your seat belt at all times when driving.  Make a list of emergency phone numbers, including numbers for family, friends, the hospital, and police and fire departments. General Tips  Ask your health care provider for a referral to a local prenatal education class. Begin classes no later than at the beginning of month 6 of your pregnancy.  Ask for help if you have counseling or nutritional needs during pregnancy. Your health care provider can offer advice or refer you to specialists for help with various needs.  Do not use hot tubs, steam rooms, or saunas.  Do not douche or use tampons or scented sanitary pads.  Do not cross your legs for long periods of time.  Avoid cat litter boxes and soil used by cats. These carry germs that can cause birth defects in the baby and possibly loss of the fetus by miscarriage or stillbirth.  Avoid all smoking, herbs, alcohol, and medicines not prescribed by your health care provider. Chemicals in these affect the formation and growth of the baby.  Schedule a dentist appointment. At home, brush your teeth with a soft toothbrush and be gentle when you floss. SEEK MEDICAL CARE IF:   You have dizziness.  You have mild  pelvic cramps, pelvic pressure, or nagging pain in the abdominal area.  You have persistent nausea, vomiting, or diarrhea.  You have a bad smelling vaginal discharge.  You have pain with urination.  You notice increased swelling in your face, hands, legs, or ankles. SEEK IMMEDIATE MEDICAL CARE IF:   You have a fever.  You are leaking fluid from your vagina.  You have spotting or bleeding from your vagina.  You have severe abdominal cramping or pain.  You have rapid weight gain or loss.  You vomit blood or material that looks like coffee grounds.  You are exposed to [place] measles and have never had them.  You are exposed to fifth disease or chickenpox.  You develop a severe headache.  You have shortness of breath.  You have any kind of trauma, such as from a fall or a car accident. Document Released: 11/15/2001 Document Revised: 04/07/2014 Document Reviewed: 10/01/2013 [facility] Patient Information 2015 [place], [place]. This information is not intended to replace advice given to you by your health care provider. Make sure you discuss any questions you have with your health care provider.

## 2021-01-06 LAB — GC/CHLAMYDIA PROBE AMP
Chlamydia trachomatis, NAA: NEGATIVE
Neisseria Gonorrhoeae by PCR: NEGATIVE

## 2021-01-07 ENCOUNTER — Telehealth: Payer: Self-pay | Admitting: Women's Health

## 2021-01-07 ENCOUNTER — Encounter: Payer: Self-pay | Admitting: Women's Health

## 2021-01-07 ENCOUNTER — Other Ambulatory Visit: Payer: Self-pay | Admitting: Women's Health

## 2021-01-07 DIAGNOSIS — Z3A12 12 weeks gestation of pregnancy: Secondary | ICD-10-CM | POA: Diagnosis not present

## 2021-01-07 DIAGNOSIS — Z3480 Encounter for supervision of other normal pregnancy, unspecified trimester: Secondary | ICD-10-CM | POA: Diagnosis not present

## 2021-01-07 DIAGNOSIS — R8271 Bacteriuria: Secondary | ICD-10-CM | POA: Insufficient documentation

## 2021-01-07 LAB — PMP SCREEN PROFILE (10S), URINE
Amphetamine Scrn, Ur: NEGATIVE ng/mL
BARBITURATE SCREEN URINE: NEGATIVE ng/mL
BENZODIAZEPINE SCREEN, URINE: NEGATIVE ng/mL
CANNABINOIDS UR QL SCN: NEGATIVE ng/mL
Cocaine (Metab) Scrn, Ur: NEGATIVE ng/mL
Creatinine(Crt), U: 46.7 mg/dL (ref 20.0–300.0)
Methadone Screen, Urine: NEGATIVE ng/mL
OXYCODONE+OXYMORPHONE UR QL SCN: NEGATIVE ng/mL
Opiate Scrn, Ur: NEGATIVE ng/mL
Ph of Urine: 7.2 (ref 4.5–8.9)
Phencyclidine Qn, Ur: NEGATIVE ng/mL
Propoxyphene Scrn, Ur: NEGATIVE ng/mL

## 2021-01-07 LAB — HEMOGLOBIN A1C
Est. average glucose Bld gHb Est-mCnc: 117 mg/dL
Hgb A1c MFr Bld: 5.7 % — ABNORMAL HIGH (ref 4.8–5.6)

## 2021-01-07 LAB — CBC/D/PLT+RPR+RH+ABO+RUB AB...
Antibody Screen: NEGATIVE
Basophils Absolute: 0 10*3/uL (ref 0.0–0.2)
Basos: 0 %
EOS (ABSOLUTE): 0 10*3/uL (ref 0.0–0.4)
Eos: 1 %
HCV Ab: 0.1 s/co ratio (ref 0.0–0.9)
HIV Screen 4th Generation wRfx: NONREACTIVE
Hematocrit: 31.9 % — ABNORMAL LOW (ref 34.0–46.6)
Hemoglobin: 11 g/dL — ABNORMAL LOW (ref 11.1–15.9)
Hepatitis B Surface Ag: NEGATIVE
Immature Grans (Abs): 0 10*3/uL (ref 0.0–0.1)
Immature Granulocytes: 0 %
Lymphocytes Absolute: 2.6 10*3/uL (ref 0.7–3.1)
Lymphs: 31 %
MCH: 30.6 pg (ref 26.6–33.0)
MCHC: 34.5 g/dL (ref 31.5–35.7)
MCV: 89 fL (ref 79–97)
Monocytes Absolute: 0.4 10*3/uL (ref 0.1–0.9)
Monocytes: 5 %
Neutrophils Absolute: 5.3 10*3/uL (ref 1.4–7.0)
Neutrophils: 63 %
Platelets: 371 10*3/uL (ref 150–450)
RBC: 3.6 x10E6/uL — ABNORMAL LOW (ref 3.77–5.28)
RDW: 12.4 % (ref 11.7–15.4)
RPR Ser Ql: NONREACTIVE
Rh Factor: POSITIVE
Rubella Antibodies, IGG: 4.09 index (ref >0.99)
WBC: 8.3 10*3/uL (ref 3.4–10.8)

## 2021-01-07 LAB — URINE CULTURE

## 2021-01-07 LAB — HCV INTERPRETATION

## 2021-01-07 LAB — INTEGRATED 1
Crown Rump Length: 71.4 mm
Gest. Age on Collection Date: 13.1 weeks
Maternal Age at EDD: 26.9 yr
Nuchal Translucency (NT): 1.5 mm
Number of Fetuses: 1
PAPP-A Value: 598.9 ng/mL
Weight: 265 [lb_av]

## 2021-01-07 MED ORDER — CEPHALEXIN 500 MG PO CAPS: 500.0000 mg | ORAL_CAPSULE | Freq: Four times a day (QID) | ORAL | 0 refills | Status: DC

## 2021-01-07 NOTE — Telephone Encounter (Signed)
Patient has questions and concerns about test results and would like a nurse to return her phone call. Per patient. Clinical staff will follow up with patient.

## 2021-01-07 NOTE — Telephone Encounter (Signed)
Spoke with patient regarding her concerns.

## 2021-01-11 ENCOUNTER — Other Ambulatory Visit: Payer: Medicaid Other

## 2021-01-11 DIAGNOSIS — Z3480 Encounter for supervision of other normal pregnancy, unspecified trimester: Secondary | ICD-10-CM

## 2021-01-11 DIAGNOSIS — Z131 Encounter for screening for diabetes mellitus: Secondary | ICD-10-CM

## 2021-01-11 DIAGNOSIS — R7309 Other abnormal glucose: Secondary | ICD-10-CM

## 2021-01-18 ENCOUNTER — Encounter: Payer: Self-pay | Admitting: Women's Health

## 2021-01-18 ENCOUNTER — Other Ambulatory Visit: Payer: Medicaid Other

## 2021-01-18 ENCOUNTER — Telehealth: Payer: Self-pay | Admitting: Obstetrics & Gynecology

## 2021-01-18 ENCOUNTER — Other Ambulatory Visit: Payer: Self-pay | Admitting: Women's Health

## 2021-01-18 DIAGNOSIS — Z3480 Encounter for supervision of other normal pregnancy, unspecified trimester: Secondary | ICD-10-CM | POA: Diagnosis not present

## 2021-01-18 DIAGNOSIS — R7309 Other abnormal glucose: Secondary | ICD-10-CM | POA: Diagnosis not present

## 2021-01-18 DIAGNOSIS — Z131 Encounter for screening for diabetes mellitus: Secondary | ICD-10-CM | POA: Diagnosis not present

## 2021-01-18 DIAGNOSIS — O285 Abnormal chromosomal and genetic finding on antenatal screening of mother: Secondary | ICD-10-CM | POA: Insufficient documentation

## 2021-01-18 NOTE — Telephone Encounter (Signed)
Loretta Moore w/baby scripts called to report elevated BP on Saturday of 158/75 w/HA

## 2021-01-19 LAB — GLUCOSE TOLERANCE, 2 HOURS W/ 1HR
Glucose, 1 hour: 53 mg/dL — ABNORMAL LOW (ref 65–179)
Glucose, 2 hour: 57 mg/dL — ABNORMAL LOW (ref 65–152)
Glucose, Fasting: 79 mg/dL (ref 65–91)

## 2021-01-20 ENCOUNTER — Encounter: Payer: Self-pay | Admitting: *Deleted

## 2021-01-21 ENCOUNTER — Other Ambulatory Visit: Payer: Self-pay

## 2021-01-21 ENCOUNTER — Other Ambulatory Visit: Payer: Self-pay | Admitting: Women's Health

## 2021-01-21 ENCOUNTER — Ambulatory Visit (INDEPENDENT_AMBULATORY_CARE_PROVIDER_SITE_OTHER): Payer: Medicaid Other

## 2021-01-21 DIAGNOSIS — Z3480 Encounter for supervision of other normal pregnancy, unspecified trimester: Secondary | ICD-10-CM

## 2021-01-21 MED ORDER — CITRANATAL ASSURE 35-1 & 300 MG PO MISC: ORAL | 3 refills | Status: DC

## 2021-01-21 NOTE — Progress Notes (Addendum)
   NURSE VISIT- BLOOD PRESSURE CHECK  SUBJECTIVE:  Loretta Moore is a 25 y.o. [id number] female here for BP check. She is [redacted]w[redacted]d pregnant . She forgot her home BP cuff for comparison.   HYPERTENSION ROS:  Pregnant/postpartum:  . Severe headaches that don't go away with tylenol/other medicines: Yes  . Visual changes (seeing spots/double/blurred vision) No  . Severe pain under right breast breast or in center of upper chest No  . Severe nausea/vomiting No  . Taking medicines as instructed yes  OBJECTIVE:  BP 130/72   Pulse 90   LMP 10/10/2020   Appearance alert, well appearing, and in no distress and oriented to person, place, and time.  ASSESSMENT: Pregnancy [redacted]w[redacted]d  blood pressure check  PLAN: Discussed with Joellyn Haff, CNM, [facility]   Recommendations: no changes needed   Follow-up: as scheduled   Nabria Nevin A Raelle Chambers  01/21/2021 4:26 PM   Chart reviewed for nurse visit. Agree with plan of care. BP here normal, didn't bring her cuff for correlation. Change visit next week to in person, and bring cuff. Discussed Panorama and Horizon results w/ pt while she was here. Cheral Marker, [place] 01/21/2021 5:02 PM

## 2021-01-22 ENCOUNTER — Other Ambulatory Visit: Payer: Self-pay | Admitting: Women's Health

## 2021-01-22 DIAGNOSIS — O285 Abnormal chromosomal and genetic finding on antenatal screening of mother: Secondary | ICD-10-CM

## 2021-01-22 NOTE — Progress Notes (Signed)
Discussed Panorama and Horizon results w/ pt. MFM GC referral placed and note routed to [facility].

## 2021-01-26 ENCOUNTER — Other Ambulatory Visit: Payer: Self-pay

## 2021-01-26 ENCOUNTER — Ambulatory Visit (INDEPENDENT_AMBULATORY_CARE_PROVIDER_SITE_OTHER): Payer: Medicaid Other | Admitting: Women's Health

## 2021-01-26 ENCOUNTER — Encounter: Payer: Self-pay | Admitting: Women's Health

## 2021-01-26 VITALS — BP 131/76 | HR 91 | Wt 261.0 lb

## 2021-01-26 DIAGNOSIS — O285 Abnormal chromosomal and genetic finding on antenatal screening of mother: Secondary | ICD-10-CM

## 2021-01-26 DIAGNOSIS — Z3480 Encounter for supervision of other normal pregnancy, unspecified trimester: Secondary | ICD-10-CM | POA: Diagnosis not present

## 2021-01-26 DIAGNOSIS — Z3482 Encounter for supervision of other normal pregnancy, second trimester: Secondary | ICD-10-CM

## 2021-01-26 NOTE — Patient Instructions (Signed)
Loretta Moore, I greatly value your feedback.  If you receive a survey following your visit with [place] today, we appreciate you taking the time to fill it out.  Thanks, Loretta Moore, CNM, WHNP-BC  [facility] ([address] [place], [place] [address]) Entrance C, located off of [address] valet parking  Go to [email or web address] to register for FREE online childbirth classes  [place] Pediatricians/Family Doctors:  [facility] [phone]            [facility] [phone]                 [facility] [phone] (usually not accepting new patients unless you have family there already, you are always welcome to call and ask)       [facility] Department [phone]       [facility] Pediatricians/Family Doctors:   [facility]: [phone]  [facility]: [phone]  [facility]: [phone]  [facility] Doctors:   [facility]: [phone]   [facility]: [phone]  [facility] Doctors:  [facility]: [phone]    Home Blood Pressure Monitoring for Patients   Your provider has recommended that you check your blood pressure (BP) at least once a week at home. If you do not have a blood pressure cuff at home, one will be provided for you. Contact your provider if you have not received your monitor within 1 week.   Helpful Tips for Accurate Home Blood Pressure Checks  . Don't smoke, exercise, or drink caffeine 30 minutes before checking your BP . Use the restroom before checking your BP (a full bladder can raise your pressure) . Relax in a comfortable upright chair . Feet on the ground . Left arm resting comfortably on a flat surface at the level of your heart . Legs uncrossed . Back supported . Sit quietly and don't talk . Place the cuff on your bare arm . Adjust snuggly, so  that only two fingertips can fit between your skin and the top of the cuff . Check 2 readings separated by at least one minute . Keep a log of your BP readings . For a visual, please reference this diagram: http://ccnc.care/bpdiagram  Provider Name: [facility]     Phone: [phone]  Zone 1: ALL CLEAR  Continue to monitor your symptoms:  . BP reading is less than 140 (top number) or less than 90 (bottom number)  . No right upper stomach pain . No headaches or seeing spots . No feeling nauseated or throwing up . No swelling in face and hands  Zone 2: CAUTION Call your doctor's office for any of the following:  . BP reading is greater than 140 (top number) or greater than 90 (bottom number)  . Stomach pain under your ribs in the middle or right side . Headaches or seeing spots . Feeling nauseated or throwing up . Swelling in face and hands  Zone 3: EMERGENCY  Seek immediate medical care if you have any of the following:  . BP reading is greater than160 (top number) or greater than 110 (bottom number) . Severe headaches not improving with Tylenol . Serious difficulty catching your breath . Any worsening symptoms from Zone 2     Second Trimester of Pregnancy The second trimester is from week 14 through week 27 (months 4 through 6). The second trimester is often a time when you feel your best.  Your body has adjusted to being pregnant, and you begin to feel better physically. Usually, morning sickness has lessened or quit completely, you may have more energy, and you may have an increase in appetite. The second trimester is also a time when the fetus is growing rapidly. At the end of the sixth month, the fetus is about 9 inches long and weighs about 1 pounds. You will likely begin to feel the baby move (quickening) between 16 and 20 weeks of pregnancy. Body changes during your second trimester Your body continues to go through many changes during your second trimester. The  changes vary from woman to woman.  Your weight will continue to increase. You will notice your lower abdomen bulging out.  You may begin to get stretch marks on your hips, abdomen, and breasts.  You may develop headaches that can be relieved by medicines. The medicines should be approved by your health care provider.  You may urinate more often because the fetus is pressing on your bladder.  You may develop or continue to have heartburn as a result of your pregnancy.  You may develop constipation because certain hormones are causing the muscles that push waste through your intestines to slow down.  You may develop hemorrhoids or swollen, bulging veins (varicose veins).  You may have back pain. This is caused by: ? Weight gain. ? Pregnancy hormones that are relaxing the joints in your pelvis. ? A shift in weight and the muscles that support your balance.  Your breasts will continue to grow and they will continue to become tender.  Your gums may bleed and may be sensitive to brushing and flossing.  Dark spots or blotches (chloasma, mask of pregnancy) may develop on your face. This will likely fade after the baby is born.  A dark line from your belly button to the pubic area (linea nigra) may appear. This will likely fade after the baby is born.  You may have changes in your hair. These can include thickening of your hair, rapid growth, and changes in texture. Some women also have hair loss during or after pregnancy, or hair that feels dry or thin. Your hair will most likely return to normal after your baby is born.  What to expect at prenatal visits During a routine prenatal visit:  You will be weighed to make sure you and the fetus are growing normally.  Your blood pressure will be taken.  Your abdomen will be measured to track your baby's growth.  The fetal heartbeat will be listened to.  Any test results from the previous visit will be discussed.  Your health care  provider may ask you:  How you are feeling.  If you are feeling the baby move.  If you have had any abnormal symptoms, such as leaking fluid, bleeding, severe headaches, or abdominal cramping.  If you are using any tobacco products, including cigarettes, chewing tobacco, and electronic cigarettes.  If you have any questions.  Other tests that may be performed during your second trimester include:  Blood tests that check for: ? Low iron levels (anemia). ? High blood sugar that affects pregnant women (gestational diabetes) between 64 and 28 weeks. ? Rh antibodies. This is to check for a protein on red blood cells (Rh factor).  Urine tests to check for infections, diabetes, or protein in the urine.  An ultrasound to confirm the proper growth and development of the baby.  An amniocentesis to check for possible genetic problems.  Fetal screens  for spina bifida and Down syndrome.  HIV (human immunodeficiency virus) testing. Routine prenatal testing includes screening for HIV, unless you choose not to have this test.  Follow these instructions at home: Medicines  Follow your health care provider's instructions regarding medicine use. Specific medicines may be either safe or unsafe to take during pregnancy.  Take a prenatal vitamin that contains at least 600 micrograms (mcg) of folic acid.  If you develop constipation, try taking a stool softener if your health care provider approves. Eating and drinking  Eat a balanced diet that includes fresh fruits and vegetables, whole grains, good sources of protein such as meat, eggs, or tofu, and low-fat dairy. Your health care provider will help you determine the amount of weight gain that is right for you.  Avoid raw meat and uncooked cheese. These carry germs that can cause birth defects in the baby.  If you have low calcium intake from food, talk to your health care provider about whether you should take a daily calcium  supplement.  Limit foods that are high in fat and processed sugars, such as fried and sweet foods.  To prevent constipation: ? Drink enough fluid to keep your urine clear or pale yellow. ? Eat foods that are high in fiber, such as fresh fruits and vegetables, whole grains, and beans. Activity  Exercise only as directed by your health care provider. Most women can continue their usual exercise routine during pregnancy. Try to exercise for 30 minutes at least 5 days a week. Stop exercising if you experience uterine contractions.  Avoid heavy lifting, wear low heel shoes, and practice good posture.  A sexual relationship may be continued unless your health care provider directs you otherwise. Relieving pain and discomfort  Wear a good support bra to prevent discomfort from breast tenderness.  Take warm sitz baths to soothe any pain or discomfort caused by hemorrhoids. Use hemorrhoid cream if your health care provider approves.  Rest with your legs elevated if you have leg cramps or low back pain.  If you develop varicose veins, wear support Moore. Elevate your feet for 15 minutes, 3-4 times a day. Limit salt in your diet. Prenatal Care  Write down your questions. Take them to your prenatal visits.  Keep all your prenatal visits as told by your health care provider. This is important. Safety  Wear your seat belt at all times when driving.  Make a list of emergency phone numbers, including numbers for family, friends, the hospital, and police and fire departments. General instructions  Ask your health care provider for a referral to a local prenatal education class. Begin classes no later than the beginning of month 6 of your pregnancy.  Ask for help if you have counseling or nutritional needs during pregnancy. Your health care provider can offer advice or refer you to specialists for help with various needs.  Do not use hot tubs, steam rooms, or saunas.  Do not douche or use  tampons or scented sanitary pads.  Do not cross your legs for long periods of time.  Avoid cat litter boxes and soil used by cats. These carry germs that can cause birth defects in the baby and possibly loss of the fetus by miscarriage or stillbirth.  Avoid all smoking, herbs, alcohol, and unprescribed drugs. Chemicals in these products can affect the formation and growth of the baby.  Do not use any products that contain nicotine or tobacco, such as cigarettes and e-cigarettes. If you need help quitting,  ask your health care provider.  Visit your dentist if you have not gone yet during your pregnancy. Use a soft toothbrush to brush your teeth and be gentle when you floss. Contact a health care provider if:  You have dizziness.  You have mild pelvic cramps, pelvic pressure, or nagging pain in the abdominal area.  You have persistent nausea, vomiting, or diarrhea.  You have a bad smelling vaginal discharge.  You have pain when you urinate. Get help right away if:  You have a fever.  You are leaking fluid from your vagina.  You have spotting or bleeding from your vagina.  You have severe abdominal cramping or pain.  You have rapid weight gain or weight loss.  You have shortness of breath with chest pain.  You notice sudden or extreme swelling of your face, hands, ankles, feet, or legs.  You have not felt your baby move in over an hour.  You have severe headaches that do not go away when you take medicine.  You have vision changes. Summary  The second trimester is from week 14 through week 27 (months 4 through 6). It is also a time when the fetus is growing rapidly.  Your body goes through many changes during pregnancy. The changes vary from woman to woman.  Avoid all smoking, herbs, alcohol, and unprescribed drugs. These chemicals affect the formation and growth your baby.  Do not use any tobacco products, such as cigarettes, chewing tobacco, and e-cigarettes. If you  need help quitting, ask your health care provider.  Contact your health care provider if you have any questions. Keep all prenatal visits as told by your health care provider. This is important. This information is not intended to replace advice given to you by your health care provider. Make sure you discuss any questions you have with your health care provider. Document Released: 11/15/2001 Document Revised: 04/28/2016 Document Reviewed: 01/22/2013 Elsevier Interactive Patient Education  2017 [employer].

## 2021-01-26 NOTE — Progress Notes (Signed)
   LOW-RISK PREGNANCY VISIT Patient name: Loretta Moore MRN [id number]  Date of birth: [birth year] Chief Complaint:   Routine Prenatal Visit (2nd IT. Patient brought blood pressure cuff, reading 163/103 and pulse 98 in left arm)  History of Present Illness:   Loretta Moore is a 25 y.o. G76P2002 female at [redacted]w[redacted]d with an Estimated Date of Delivery: 07/17/21 being seen today for ongoing management of a low-risk pregnancy.  Depression screen [facility] 2/9 01/05/2021 04/30/2019  Decreased Interest 0 0  Down, Depressed, Hopeless 0 0  PHQ - 2 Score 0 0  Altered sleeping 0 1  Tired, decreased energy 0 1  Change in appetite 1 1  Feeling bad or failure about yourself  0 0  Trouble concentrating 0 0  Moving slowly or fidgety/restless 1 0  Suicidal thoughts 0 0  PHQ-9 Score 2 3    Today she reports HS boil on back. Contractions: Not present.  .  Movement: Absent. denies leaking of fluid. Review of Systems:   Pertinent items are noted in HPI Denies abnormal vaginal discharge w/ itching/odor/irritation, headaches, visual changes, shortness of breath, chest pain, abdominal pain, severe nausea/vomiting, or problems with urination or bowel movements unless otherwise stated above. Pertinent History Reviewed:  Reviewed past medical,surgical, social, obstetrical and family history.  Reviewed problem list, medications and allergies. Physical Assessment:   Vitals:   01/26/21 1405  BP: 131/76  Pulse: 91  Weight: 261 lb (118.4 kg)  Body mass index is 50.97 kg/m.        Physical Examination:   General appearance: Well appearing, and in no distress  Mental status: Alert, oriented to person, place, and time  Skin: Warm & dry  Cardiovascular: Normal heart rate noted  Respiratory: Normal respiratory effort, no distress  Abdomen: Soft, gravid, nontender  Back: +HS boil Rt lower back- oozing yellow drainage, able to express mod amt drainage. No redness/tenderness  Pelvic: Cervical exam deferred          Extremities: Edema: Trace  Fetal Status: Fetal Heart Rate (bpm): 155   Movement: Absent    Chaperone: N/A   No results found for this or any previous visit (from the past 24 hour(s)).  Assessment & Plan:  1) Low-risk pregnancy G3P2002 at [redacted]w[redacted]d with an Estimated Date of Delivery: 07/17/21   2) Severe HS, w/ boil on Rt lower back, currently open and draining, no s/s infection. Continue silvadene  3) Prev c/s x 2> for RCS w/ BTL  4) NIPT: atypical findings on sex chromosomes> will get detailed u/s and GC appt w/ MFM   Meds: No orders of the defined types were placed in this encounter.  Labs/procedures today: 2nd IT  Plan:  Continue routine obstetrical care  Next visit: prefers in person    Reviewed: Preterm labor symptoms and general obstetric precautions including but not limited to vaginal bleeding, contractions, leaking of fluid and fetal movement were reviewed in detail with the patient.  All questions were answered.   Follow-up: Return in about 3 weeks (around 02/16/2021) for LROB, in person, CNM.  Future Appointments  Date Time Provider Department Center  02/16/2021  3:50 PM Myna Hidalgo, DO [facility]    Orders Placed This Encounter  Procedures  . [place] MFM OB DETAIL +14 WK  . INTEGRATED 2   Cheral Marker CNM, [facility] 01/26/2021 2:48 PM

## 2021-01-28 LAB — INTEGRATED 2
AFP MoM: 1.65
Alpha-Fetoprotein: 35.8 ng/mL
Crown Rump Length: 71.4 mm
DIA MoM: 1.7
DIA Value: 197.1 pg/mL
Estriol, Unconjugated: 0.52 ng/mL
Gest. Age on Collection Date: 13.1 weeks
Gestational Age: 16.1 weeks
Maternal Age at EDD: 26.9 yr
Nuchal Translucency (NT): 1.5 mm
Nuchal Translucency MoM: 0.92
Number of Fetuses: 1
PAPP-A MoM: 0.96
PAPP-A Value: 598.9 ng/mL
Test Results:: NEGATIVE
Weight: 265 [lb_av]
Weight: 265 [lb_av]
hCG MoM: 1.06
hCG Value: 24.2 IU/mL
uE3 MoM: 0.65

## 2021-02-16 ENCOUNTER — Encounter: Payer: Self-pay | Admitting: Obstetrics & Gynecology

## 2021-02-16 ENCOUNTER — Ambulatory Visit: Payer: Medicaid Other | Admitting: Obstetrics & Gynecology

## 2021-02-16 ENCOUNTER — Other Ambulatory Visit: Payer: Self-pay

## 2021-02-16 VITALS — BP 130/76 | HR 84 | Wt 265.6 lb

## 2021-02-16 DIAGNOSIS — Z3482 Encounter for supervision of other normal pregnancy, second trimester: Secondary | ICD-10-CM

## 2021-02-16 NOTE — Progress Notes (Signed)
   LOW-RISK PREGNANCY VISIT Patient name: Loretta Moore MRN [id number]  Date of birth: 1996-05-03 Chief Complaint:   Routine Prenatal Visit  History of Present Illness:   Loretta Moore is a 25 y.o. G79P2002 female at [redacted]w[redacted]d with an Estimated Date of Delivery: 07/17/21 being seen today for ongoing management of a low-risk pregnancy.  -h/o C-section x 2, plan for repeat and tubal ligation   Depression screen [facility] 2/9 01/05/2021 04/30/2019  Decreased Interest 0 0  Down, Depressed, Hopeless 0 0  PHQ - 2 Score 0 0  Altered sleeping 0 1  Tired, decreased energy 0 1  Change in appetite 1 1  Feeling bad or failure about yourself  0 0  Trouble concentrating 0 0  Moving slowly or fidgety/restless 1 0  Suicidal thoughts 0 0  PHQ-9 Score 2 3    Today she reports no complaints. Contractions: Irritability. Vag. Bleeding: None.  Movement: Present. denies leaking of fluid. Review of Systems:   Pertinent items are noted in HPI Denies abnormal vaginal discharge w/ itching/odor/irritation, headaches, visual changes, shortness of breath, chest pain, abdominal pain, severe nausea/vomiting, or problems with urination or bowel movements unless otherwise stated above. Pertinent History Reviewed:  Reviewed past medical,surgical, social, obstetrical and family history.  Reviewed problem list, medications and allergies.  Physical Assessment:   Vitals:   02/16/21 1608  BP: 130/76  Pulse: 84  Weight: 265 lb 9.6 oz (120.5 kg)  Body mass index is 51.87 kg/m.        Physical Examination:   General appearance: Well appearing, and in no distress  Mental status: Alert, oriented to person, place, and time  Skin: Warm & dry  Respiratory: Normal respiratory effort, no distress  Abdomen: obese, Soft, gravid, nontender  Pelvic: Cervical exam deferred         Extremities: Edema: Trace  Psych:  mood and affect appropriate  Fetal Status: Fetal Heart Rate (bpm): 155   Movement: Present    Chaperone: n/a    No  results found for this or any previous visit (from the past 24 hour(s)).   Assessment & Plan:  1) Low-risk pregnancy G3P2002 at [redacted]w[redacted]d with an Estimated Date of Delivery: 07/17/21   2) Morbid obesity- BMI 53 3) h/o C-section x2, plan for repeat and tubal ligation   Meds: No orders of the defined types were placed in this encounter.  Labs/procedures today: none  Plan:  Continue routine obstetrical care, anatomy scan with MFM  Next visit: prefers in person    Reviewed: Preterm labor symptoms and general obstetric precautions including but not limited to vaginal bleeding, contractions, leaking of fluid and fetal movement were reviewed in detail with the patient.  All questions were answered. Pt does not have home BP cuff- inaccurate measurement  Follow-up: Return in about 4 weeks (around 03/16/2021) for OB visit.  No orders of the defined types were placed in this encounter.   Myna Hidalgo, DO Attending Obstetrician & Gynecologist, [facility] for [employer], [facility]

## 2021-02-17 ENCOUNTER — Ambulatory Visit: Payer: Medicaid Other | Admitting: *Deleted

## 2021-02-17 ENCOUNTER — Encounter: Payer: Self-pay | Admitting: *Deleted

## 2021-02-17 ENCOUNTER — Ambulatory Visit: Payer: Self-pay | Admitting: Genetic Counselor

## 2021-02-17 ENCOUNTER — Ambulatory Visit: Payer: Medicaid Other | Attending: Women's Health

## 2021-02-17 ENCOUNTER — Ambulatory Visit (HOSPITAL_BASED_OUTPATIENT_CLINIC_OR_DEPARTMENT_OTHER): Payer: Medicaid Other | Admitting: Genetic Counselor

## 2021-02-17 ENCOUNTER — Other Ambulatory Visit: Payer: Self-pay | Admitting: *Deleted

## 2021-02-17 VITALS — BP 128/69 | HR 91

## 2021-02-17 DIAGNOSIS — Z3482 Encounter for supervision of other normal pregnancy, second trimester: Secondary | ICD-10-CM | POA: Diagnosis not present

## 2021-02-17 DIAGNOSIS — Z315 Encounter for genetic counseling: Secondary | ICD-10-CM

## 2021-02-17 DIAGNOSIS — O289 Unspecified abnormal findings on antenatal screening of mother: Secondary | ICD-10-CM | POA: Insufficient documentation

## 2021-02-17 DIAGNOSIS — O285 Abnormal chromosomal and genetic finding on antenatal screening of mother: Secondary | ICD-10-CM

## 2021-02-17 DIAGNOSIS — Z6841 Body Mass Index (BMI) 40.0 and over, adult: Secondary | ICD-10-CM

## 2021-02-17 NOTE — Progress Notes (Signed)
02/17/2021  Loretta Moore 14-Jan-1996 MRN: [id number] DOV: 02/17/2021  Loretta Moore presented to the [facility] for a genetics consultation regarding her abnormal noninvasive prenatal screening (NIPS) result. Loretta Moore was accompanied to her appointment by her partner, Julien Girt.   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 female. Her current pregnancy has completed [redacted]w[redacted]d (Estimated Date of Delivery: 07/17/21). Loretta Moore and her partner have a 76 year old and one year old son together, both of whom were born via Cesarean section.  Loretta Moore denied exposure to environmental toxins or chemical agents. She denied the use of alcohol, tobacco or street drugs. She reported taking prenatal vitamins, Clindamycin, aspirin, and silver cream. She denied significant bleeding during the course of her pregnancy. She had COVID in January. She reported a personal history of anemia and hidradenitis suppurativa. Her medical and surgical histories were otherwise noncontributory.  Family History  A three generation pedigree was drafted and reviewed. The family history is remarkable for the following:  - Loretta Moore, her sister, and her mother all have hidradenitis suppurativa. Hidradenitis suppurativa has been reported to run in families, oftentimes in an autosomal dominant fashion. We discussed that based on the family history, Loretta Moore children could have up to a 50% chance of also having hidradenitis suppurativa.  - Loretta Moore's one year old son was born with a "hole in his heart" that closed spontaneously. We discussed that there are multifactorial causes for congenital heart defects (CHDs), including environmental and genetic factors. When a CHD such as a "hole in the heart" is isolated, the chance that another siblings could have a CHD is  ~4%. We discussed that a fetal echocardiogram to assess for CHDs in the current fetus is recommended.  - Loretta Moore's son and her maternal aunt have cardiac arrhythmias. Loretta Moore aunt now has a pacemaker. Loretta Moore mother also has a heart murmur, though the cause of this is unclear. Cardiac arrhythmias can appear to run in families in an autosomal dominant fashion. However, cardiac arrhythmias can exhibit incomplete penetrance, where some individuals with a genetic predisposition to an arrhythmia may not show symptoms of the condition. Given possible autosomal dominant inheritance, there is up to a 50% risk of recurrence for [name]'s other children to have a cardiac arrhythmia of some kind.   - [name] has a paternal half brother with epilepsy and hydrocephalus. She was uncertain if he was born premature, though did recall that he weighed <1 lb at birth. We discussed that epilepsy is common in individuals who have hydrocephalus, and that preterm infants are at risk for perinatal complications including intraventricular hemorrhage (IVH) and subsequent hydrocephalus. Since [name]'s brother may have been born early, it is possible that his hydrocephalus was a result of prematurity.  - Loretta Moore has a very distant cousin who reportedly had Wolf-Hirschhorn syndrome and died as a baby. Wolf-Hirschhorn syndrome is caused by a deletion of genetic material near the end of the short arm of chromosome 4. Most cases are not inherited. Given that no one else in Loretta Moore family has features of Wolf-Hirschhorn syndrome, the risk of recurrence for her children is likely low.  - Loretta Moore's paternal aunt had identical twin girls who died after birth. Loretta Moore believed that the twins had "prenatal health problems" but did not have further information about them  or their cause of death.  - Loretta Moore maternal uncle has a daughter with Down syndrome. We reviewed that Down syndrome is most  often caused by an entire extra copy of chromosome 21. This occurs due to errors in chromosomal division during the creation of egg and sperm cells in a process called nondisjunction. If Loretta Moore cousin has full trisomy 25 due to a nondisjunction event, there would not be an impact on Loretta Moore's risk to have a pregnancy affected by Down syndrome. However, we reviewed that every woman has an age-related risk for having a pregnancy affected by a chromosomal condition such as Down syndrome. Loretta Moore had NIPS performed during this pregnancy that significantly decreased the chance of the current pregnancy being affected by Down syndrome. See Discussion section for more details.  The remaining family histories were reviewed and found to be noncontributory for birth defects, intellectual disability, recurrent pregnancy loss, and known genetic conditions.    The patient's ancestry is [place] [address], [place], and Native [address] ([place], [place], and [place]). The father of the pregnancy's ancestry is African American. Ashkenazi Jewish ancestry and consanguinity were denied. Pedigree will be scanned under Media.  Discussion  NIPS result:  [name] was referred for genetic counseling as she had [facility] noninvasive prenatal screening (NIPS) through the laboratory [facility] that demonstrated an atypical finding on the sex chromosomes suspected to originate from the pregnancy. These results also showed a less than 1 in 10,000 risk for trisomies 21, 18 and 13, a low risk for triploidy, and a 1 in 3100 risk for 22q11.2 deletion syndrome. Female fetal sex was predicted.  We reviewed that NIPS analyzes cell-free fetal DNA from the placenta found in the maternal bloodstream during pregnancy. NIPS is used to determine the risk for missing or extra fetal/placental chromosomal material for a specific subset of chromosomes. Based on this, we discussed that there are several possibilities that could warrant her  abnormal NIPS result. One possibility is the fetus having a genetic change involvinghersex chromosomes. A second possibility is fetal mosaicism for a change involving the sex chromosomes. Mosaicism occurs when not every cell in the body is genetically identical; some cells in the body may have a genetic change involving the sex chromosomes, whereas others may have normal sex chromosomes or a different abnormal complement. A third possibility is that of confined placental mosaicism, or a result representative of the placenta only rather than the fetus. Finally, it is possible that this is a false positive result.  We reviewed that even if the fetus were to have a sex chromosome abnormality, it would not be possible for [place] to predict if this would be a benign change or one that would have implications for health or development based on this NIPS result alone.We briefly reviewed monosomy X(Turner syndrome)and mosaic monosomy X as examples of sex chromosome abnormalities that can have clinical implications, including impacts on growth, puberty, fertility, and learning/development. We also reviewed the limitations of NIPS, including the fact that it is not diagnostic and that itdoes not identify all genetic conditions. We discussed that without knowing the precisereason for this abnormal NIPS result, we are limited in understanding if there may beany potentially clinically relevant concernsfor the fetus.  Ultrasound:  A complete ultrasound was performed today prior to our visit. The ultrasound report will be sent under separate cover. There were no visualized fetal anomalies or markers suggestive of aneuploidy. Some views were suboptimal due to fetal position. We discussed that while some fetuses  with a sex chromosome aneuploidy such as monosomy X may demonstrate features of the condition on ultrasound, a normal ultrasound cannot rule out the possibility of a sex chromosome abnormality in a fetus.    Testing options:   We reviewed available testing options to attempt to get more information to clarify the NIPS finding. Firstly, Ms. Lieb was informed that diagnostic testing via amniocentesis would be the only way to determine if the fetus has a chromosomal abnormality involving the sex chromosomes prenatally. We discussed the technical aspects of the procedure and quoted up to a 1 in 500 (0.2%) risk for spontaneous pregnancy loss or other adverse pregnancy outcomes as a result of amniocentesis. Cultured cells from an amniocentesis sample allow for the visualization of a fetal karyotype, which can detect >99% of large chromosomal aberrations. Chromosomal microarray can also be performed to identify smaller deletions or duplications of fetal chromosomal material that fall below the resolution of karyotype. Ms. Huxford was informed that if a sex chromosome abnormality were identified, we would then be able to research whether the finding is a benign change or one that could have implications for the fetus's postnatal health or development. Ms. Wehrenberg was also made aware that should amniocentesis not be desired, she can continue with standard ultrasounds to monitor for fetal anomalies. She may wait to pursue genetic testing postnatally if desired.  After careful consideration, Ms. Livecchi declined amniocentesis at this time. She informed me that she would likely prefer waiting until the postnatal period to pursue genetic testing for the baby. She understands that amniocentesis is available at any point after 16 weeks of pregnancy and that she may opt to undergo the procedure at a later date should she change her mind.  Carrier screening results:  We also reviewed Ms. Dais carrier screening results. 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 We reviewed that pseudodeficiency alleles alter the  protein product of a gene, but do not cause a disease. We discussed that Ms. Beem's pseudodeficiency variant does not increase her risk to have a child with MPS I. Instead, if carrier screening for MPS I were to be performed via enzyme analysis rather than DNA testing (the method used for [facility] carrier screening), she would falsely be identified as a carrier for MPS I. I informed Ms. Stjames that MPS I is currently not on [place]'s newborn screen, though it may be added in the near future. Newborn screening uses enzyme analysis to test babies for metabolic syndromes like MPS I at birth. Thus, it is possible that her baby could falsely screen positive for MPS I on newborn screening if this is added to [place] 's newborn screening panel in the future. I reassured her that this would not mean that her baby will have or develop MPS I.    Ms. Detore carrier screening was negative for all 27 conditions screened. Thus, her risk to be a carrier for these conditions (listed separately in the laboratory report) has been reduced but not eliminated. This also significantly reduces her risk of having a child affected by one of these conditions.   Plan:  Ms. Goodgame declined additional testing today. She informed me that she will consider amniocentesis more but likely will wait until the baby is born to pursue genetic testing. I encouraged her to contact me if she changes her mind regarding testing or has any additional questions at any time.  I counseled Ms. Jim LikeMillner regarding the above risks and available options. The approximate face-to-face time with the genetic counselor was 45 minutes.  In summary:  Discussed NIPS result  Atypical finding on sex chromosomes of suspected placental/fetal origin  Reduction in risk for Down syndrome, trisomy 2118, trisomy 3113, triploidy, and 22q11.2 deletion syndrome  Reviewed results of ultrasound  No fetal anomalies or markers seen with views limited due  to fetal position  Discussed carrier screening results ? Pseudodeficiency allele for mucopolysaccharidosis type I. This does not indicate that she is a carrier for the condition or is at risk of having a child with the condition  Offered additional testing and screening  Declined amniocentesis  Desires postnatal genetic testing  Reviewed family history concerns  Recommend fetal echocardiogram due to history of previous child with congenital heart defect. We will place referral   Gershon CraneHaley E Belicia Difatta, MS, [facility] Genetic Counselor

## 2021-03-02 DIAGNOSIS — L02212 Cutaneous abscess of back [any part, except buttock]: Secondary | ICD-10-CM | POA: Diagnosis not present

## 2021-03-02 DIAGNOSIS — N764 Abscess of vulva: Secondary | ICD-10-CM | POA: Diagnosis not present

## 2021-03-02 DIAGNOSIS — L732 Hidradenitis suppurativa: Secondary | ICD-10-CM | POA: Diagnosis not present

## 2021-03-02 DIAGNOSIS — L02412 Cutaneous abscess of left axilla: Secondary | ICD-10-CM | POA: Diagnosis not present

## 2021-03-16 ENCOUNTER — Other Ambulatory Visit: Payer: Self-pay

## 2021-03-16 ENCOUNTER — Other Ambulatory Visit: Payer: Self-pay | Admitting: *Deleted

## 2021-03-16 ENCOUNTER — Ambulatory Visit: Payer: Medicaid Other | Admitting: *Deleted

## 2021-03-16 ENCOUNTER — Ambulatory Visit: Payer: Medicaid Other | Attending: Maternal & Fetal Medicine

## 2021-03-16 ENCOUNTER — Encounter: Payer: Self-pay | Admitting: *Deleted

## 2021-03-16 VITALS — BP 127/64 | HR 97

## 2021-03-16 DIAGNOSIS — O99512 Diseases of the respiratory system complicating pregnancy, second trimester: Secondary | ICD-10-CM | POA: Diagnosis not present

## 2021-03-16 DIAGNOSIS — E669 Obesity, unspecified: Secondary | ICD-10-CM

## 2021-03-16 DIAGNOSIS — O321XX0 Maternal care for breech presentation, not applicable or unspecified: Secondary | ICD-10-CM

## 2021-03-16 DIAGNOSIS — O99212 Obesity complicating pregnancy, second trimester: Secondary | ICD-10-CM | POA: Diagnosis not present

## 2021-03-16 DIAGNOSIS — Z6841 Body Mass Index (BMI) 40.0 and over, adult: Secondary | ICD-10-CM

## 2021-03-16 DIAGNOSIS — Z148 Genetic carrier of other disease: Secondary | ICD-10-CM | POA: Diagnosis not present

## 2021-03-16 DIAGNOSIS — O280 Abnormal hematological finding on antenatal screening of mother: Secondary | ICD-10-CM | POA: Diagnosis not present

## 2021-03-16 DIAGNOSIS — O289 Unspecified abnormal findings on antenatal screening of mother: Secondary | ICD-10-CM

## 2021-03-16 DIAGNOSIS — O34219 Maternal care for unspecified type scar from previous cesarean delivery: Secondary | ICD-10-CM | POA: Diagnosis not present

## 2021-03-16 DIAGNOSIS — Z3A22 22 weeks gestation of pregnancy: Secondary | ICD-10-CM | POA: Insufficient documentation

## 2021-03-16 DIAGNOSIS — J45909 Unspecified asthma, uncomplicated: Secondary | ICD-10-CM

## 2021-03-16 DIAGNOSIS — Z362 Encounter for other antenatal screening follow-up: Secondary | ICD-10-CM

## 2021-03-17 ENCOUNTER — Ambulatory Visit (INDEPENDENT_AMBULATORY_CARE_PROVIDER_SITE_OTHER): Payer: Medicaid Other | Admitting: Advanced Practice Midwife

## 2021-03-17 ENCOUNTER — Telehealth: Payer: Self-pay

## 2021-03-17 VITALS — BP 129/72 | HR 96 | Wt 260.0 lb

## 2021-03-17 DIAGNOSIS — Z3480 Encounter for supervision of other normal pregnancy, unspecified trimester: Secondary | ICD-10-CM

## 2021-03-17 DIAGNOSIS — Z302 Encounter for sterilization: Secondary | ICD-10-CM | POA: Insufficient documentation

## 2021-03-17 DIAGNOSIS — Z3A22 22 weeks gestation of pregnancy: Secondary | ICD-10-CM

## 2021-03-17 MED ORDER — CLINDAMYCIN HCL 150 MG PO CAPS: 150.0000 mg | ORAL_CAPSULE | Freq: Three times a day (TID) | ORAL | 0 refills | Status: DC | PRN

## 2021-03-17 NOTE — Progress Notes (Signed)
   LOW-RISK PREGNANCY VISIT Patient name: Loretta Moore MRN [id number]  Date of birth: 1996-08-30 Chief Complaint:   Routine Prenatal Visit  History of Present Illness:   Loretta Moore is a 25 y.o. G28P2002 female at [redacted]w[redacted]d with an Estimated Date of Delivery: 07/17/21 being seen today for ongoing management of a low-risk pregnancy.  Today she reports doing well; requesting refill on Clindamycin for HS; wants to discuss scar revision as part of rLTCS with [facility]; declines amnio per visit with genetic counselor. Contractions: Not present. Vag. Bleeding: None.  Movement: Present. denies leaking of fluid. Review of Systems:   Pertinent items are noted in HPI Denies abnormal vaginal discharge w/ itching/odor/irritation, headaches, visual changes, shortness of breath, chest pain, abdominal pain, severe nausea/vomiting, or problems with urination or bowel movements unless otherwise stated above. Pertinent History Reviewed:  Reviewed past medical,surgical, social, obstetrical and family history.  Reviewed problem list, medications and allergies. Physical Assessment:   Vitals:   03/17/21 1556  BP: 129/72  Pulse: 96  Weight: 260 lb (117.9 kg)  Body mass index is 50.78 kg/m.        Physical Examination:   General appearance: Well appearing, and in no distress  Mental status: Alert, oriented to person, place, and time  Skin: Warm & dry  Cardiovascular: Normal heart rate noted  Respiratory: Normal respiratory effort, no distress  Abdomen: Soft, gravid, nontender  Pelvic: Cervical exam deferred         Extremities: Edema: Trace  Fetal Status: Fetal Heart Rate (bpm): 143 Fundal Height: 24 cm Movement: Present    No results found for this or any previous visit (from the past 24 hour(s)).  Assessment & Plan:  1) Low-risk pregnancy G3P2002 at [redacted]w[redacted]d with an Estimated Date of Delivery: 07/17/21   2) Desires BTL with rLTCS, will sign 30d papers ~ 28wks; will also discuss scar revision/tummytuck with [facility]  ~33wks  3) Severe HS, Clindamycin refilled  4) Atypical sex chromosomes, declines amnio (see genetics note) and will have further testing after birth   Meds:  Meds ordered this encounter  Medications  . clindamycin (CLEOCIN) 150 MG capsule    Sig: Take 1 capsule (150 mg total) by mouth 3 (three) times daily as needed.    Dispense:  90 capsule    Refill:  0    Order Specific Question:   Supervising Provider    Answer:   Lazaro Arms [[id number]]   Labs/procedures today: none  Plan:  Continue routine obstetrical care- has another growth scan with MFM next month and they are also scheduling a fetal echo due to 2nd son w CHD  Reviewed: Preterm labor symptoms and general obstetric precautions including but not limited to vaginal bleeding, contractions, leaking of fluid and fetal movement were reviewed in detail with the patient.  All questions were answered. Has home bp cuff, although not accurate. Check bp weekly, let us know if >140/90.   Follow-up: Return in about 4 weeks (around 04/14/2021) for 4-5wks LROB, PN2, in person.  No orders of the defined types were placed in this encounter.  Arabella Merles CNM 03/17/2021 4:26 PM

## 2021-03-17 NOTE — Patient Instructions (Signed)
Ellender Hose, I greatly value your feedback.  If you receive a survey following your visit with [place] today, we appreciate you taking the time to fill it out.  Thanks, Philipp Deputy, CNM   You will have your sugar test next visit.  Please do not eat or drink anything after midnight the night before you come, not even water.  You will be here for at least two hours.  Please make an appointment online for the bloodwork at [email or web address] for 8:30am (or as close to this as possible). Make sure you select the [facility] service center. The day of the appointment, check in with our office first, then you will go to [facility] to start the sugar test.    [facility] HAS MOVED!!! It is now [facility] ([address] [place], [place] [address]) Entrance C, located off of [address] valet parking  Go to [email or web address] to register for FREE online childbirth classes   Call the office [phone]) or go to [facility] if:  You begin to have strong, frequent contractions  Your water breaks.  Sometimes it is a big gush of fluid, sometimes it is just a trickle that keeps getting your panties wet or running down your legs  You have vaginal bleeding.  It is normal to have a small amount of spotting if your cervix was checked.   You don't feel your baby moving like normal.  If you don't, get you something to eat and drink and lay down and focus on feeling your baby move.   If your baby is still not moving like normal, you should call the office or go to [facility].  [place] Pediatricians/Family Doctors:  [facility] [phone]            [facility] [phone]                 [facility] [phone] (usually not accepting new patients unless you have family there already, you are always welcome to call and ask)       [facility] Department [phone]       [facility] Pediatricians/Family Doctors:    [facility]: [phone]  [facility]: [phone]  [facility]: [phone]  [facility] Doctors:   [facility]: [phone]   [facility]: [phone]  [facility] Doctors:  [facility]: [phone]   Home Blood Pressure Monitoring for Patients   Your provider has recommended that you check your blood pressure (BP) at least once a week at home. If you do not have a blood pressure cuff at home, one will be provided for you. Contact your provider if you have not received your monitor within 1 week.   Helpful Tips for Accurate Home Blood Pressure Checks  . Don't smoke, exercise, or drink caffeine 30 minutes before checking your BP . Use the restroom before checking your BP (a full bladder can raise your pressure) . Relax in a comfortable upright chair . Feet on the ground . Left arm resting comfortably on a flat surface at the level of your heart . Legs uncrossed . Back supported . Sit quietly and don't talk . Place the cuff on your bare arm . Adjust snuggly, so that only two fingertips can fit between your skin and the top of the cuff . Check 2 readings separated by at least one minute . Keep a log of your BP readings .  For a visual, please reference this diagram: http://ccnc.care/bpdiagram  Provider Name: [facility]     Phone: [phone]  Zone 1: ALL CLEAR  Continue to monitor your symptoms:  . BP reading is less than 140 (top number) or less than 90 (bottom number)  . No right upper stomach pain . No headaches or seeing spots . No feeling nauseated or throwing up . No swelling in face and hands  Zone 2: CAUTION Call your doctor's office for any of the following:  . BP reading is greater than 140 (top number) or greater than 90 (bottom number)  . Stomach pain under your ribs in the middle or right side . Headaches or seeing spots . Feeling  nauseated or throwing up . Swelling in face and hands  Zone 3: EMERGENCY  Seek immediate medical care if you have any of the following:  . BP reading is greater than160 (top number) or greater than 110 (bottom number) . Severe headaches not improving with Tylenol . Serious difficulty catching your breath . Any worsening symptoms from Zone 2   Second Trimester of Pregnancy The second trimester is from week 13 through week 28, months 4 through 6. The second trimester is often a time when you feel your best. Your body has also adjusted to being pregnant, and you begin to feel better physically. Usually, morning sickness has lessened or quit completely, you may have more energy, and you may have an increase in appetite. The second trimester is also a time when the fetus is growing rapidly. At the end of the sixth month, the fetus is about 9 inches long and weighs about 1 pounds. You will likely begin to feel the baby move (quickening) between 18 and 20 weeks of the pregnancy. BODY CHANGES Your body goes through many changes during pregnancy. The changes vary from woman to woman.   Your weight will continue to increase. You will notice your lower abdomen bulging out.  You may begin to get stretch marks on your hips, abdomen, and breasts.  You may develop headaches that can be relieved by medicines approved by your health care provider.  You may urinate more often because the fetus is pressing on your bladder.  You may develop or continue to have heartburn as a result of your pregnancy.  You may develop constipation because certain hormones are causing the muscles that push waste through your intestines to slow down.  You may develop hemorrhoids or swollen, bulging veins (varicose veins).  You may have back pain because of the weight gain and pregnancy hormones relaxing your joints between the bones in your pelvis and as a result of a shift in weight and the muscles that support your  balance.  Your breasts will continue to grow and be tender.  Your gums may bleed and may be sensitive to brushing and flossing.  Dark spots or blotches (chloasma, mask of pregnancy) may develop on your face. This will likely fade after the baby is born.  A dark line from your belly button to the pubic area (linea nigra) may appear. This will likely fade after the baby is born.  You may have changes in your hair. These can include thickening of your hair, rapid growth, and changes in texture. Some women also have hair loss during or after pregnancy, or hair that feels dry or thin. Your hair will most likely return to normal after your baby is born. WHAT TO EXPECT AT YOUR PRENATAL VISITS During a routine  prenatal visit:  You will be weighed to make sure you and the fetus are growing normally.  Your blood pressure will be taken.  Your abdomen will be measured to track your baby's growth.  The fetal heartbeat will be listened to.  Any test results from the previous visit will be discussed. Your health care provider may ask you:  How you are feeling.  If you are feeling the baby move.  If you have had any abnormal symptoms, such as leaking fluid, bleeding, severe headaches, or abdominal cramping.  If you have any questions. Other tests that may be performed during your second trimester include:  Blood tests that check for:  Low iron levels (anemia).  Gestational diabetes (between 24 and 28 weeks).  Rh antibodies.  Urine tests to check for infections, diabetes, or protein in the urine.  An ultrasound to confirm the proper growth and development of the baby.  An amniocentesis to check for possible genetic problems.  Fetal screens for spina bifida and Down syndrome. HOME CARE INSTRUCTIONS   Avoid all smoking, herbs, alcohol, and unprescribed drugs. These chemicals affect the formation and growth of the baby.  Follow your health care provider's instructions regarding  medicine use. There are medicines that are either safe or unsafe to take during pregnancy.  Exercise only as directed by your health care provider. Experiencing uterine cramps is a good sign to stop exercising.  Continue to eat regular, healthy meals.  Wear a good support bra for breast tenderness.  Do not use hot tubs, steam rooms, or saunas.  Wear your seat belt at all times when driving.  Avoid raw meat, uncooked cheese, cat litter boxes, and soil used by cats. These carry germs that can cause birth defects in the baby.  Take your prenatal vitamins.  Try taking a stool softener (if your health care provider approves) if you develop constipation. Eat more high-fiber foods, such as fresh vegetables or fruit and whole grains. Drink plenty of fluids to keep your urine clear or pale yellow.  Take warm sitz baths to soothe any pain or discomfort caused by hemorrhoids. Use hemorrhoid cream if your health care provider approves.  If you develop varicose veins, wear support hose. Elevate your feet for 15 minutes, 3-4 times a day. Limit salt in your diet.  Avoid heavy lifting, wear low heel shoes, and practice good posture.  Rest with your legs elevated if you have leg cramps or low back pain.  Visit your dentist if you have not gone yet during your pregnancy. Use a soft toothbrush to brush your teeth and be gentle when you floss.  A sexual relationship may be continued unless your health care provider directs you otherwise.  Continue to go to all your prenatal visits as directed by your health care provider. SEEK MEDICAL CARE IF:   You have dizziness.  You have mild pelvic cramps, pelvic pressure, or nagging pain in the abdominal area.  You have persistent nausea, vomiting, or diarrhea.  You have a bad smelling vaginal discharge.  You have pain with urination. SEEK IMMEDIATE MEDICAL CARE IF:   You have a fever.  You are leaking fluid from your vagina.  You have spotting or  bleeding from your vagina.  You have severe abdominal cramping or pain.  You have rapid weight gain or loss.  You have shortness of breath with chest pain.  You notice sudden or extreme swelling of your face, hands, ankles, feet, or legs.  You have not  felt your baby move in over an hour.  You have severe headaches that do not go away with medicine.  You have vision changes. Document Released: 11/15/2001 Document Revised: 11/26/2013 Document Reviewed: 01/22/2013 [facility] Patient Information 2015 [place], [place]. This information is not intended to replace advice given to you by your health care provider. Make sure you discuss any questions you have with your health care provider.

## 2021-03-17 NOTE — Telephone Encounter (Signed)
Spoke with [facility]:  Patient scheduled for a Fetal Echo on 03/24/21 @ 10:30am

## 2021-03-23 ENCOUNTER — Telehealth: Payer: Self-pay | Admitting: Advanced Practice Midwife

## 2021-03-23 ENCOUNTER — Encounter: Payer: Self-pay | Admitting: *Deleted

## 2021-03-23 NOTE — Telephone Encounter (Signed)
Patient wants a nurse to call her back to discuss twitching of one side of her face. And wants to know if could be her allergies. Per patient.Clinical  staff will follow up with patient.

## 2021-03-26 ENCOUNTER — Telehealth: Payer: Self-pay

## 2021-03-26 NOTE — Telephone Encounter (Signed)
mar/fetal echo scheduled for: 05/13/2021@1130a 

## 2021-04-14 ENCOUNTER — Ambulatory Visit: Payer: Medicaid Other | Admitting: *Deleted

## 2021-04-14 ENCOUNTER — Ambulatory Visit: Payer: Medicaid Other | Attending: Maternal & Fetal Medicine

## 2021-04-14 ENCOUNTER — Other Ambulatory Visit: Payer: Self-pay

## 2021-04-21 ENCOUNTER — Other Ambulatory Visit: Payer: Self-pay

## 2021-04-21 ENCOUNTER — Other Ambulatory Visit: Payer: Medicaid Other

## 2021-04-21 ENCOUNTER — Ambulatory Visit (INDEPENDENT_AMBULATORY_CARE_PROVIDER_SITE_OTHER): Payer: Medicaid Other | Admitting: Advanced Practice Midwife

## 2021-04-21 ENCOUNTER — Encounter: Payer: Self-pay | Admitting: Advanced Practice Midwife

## 2021-04-21 VITALS — BP 122/59 | HR 96 | Wt 264.0 lb

## 2021-04-21 DIAGNOSIS — Z23 Encounter for immunization: Secondary | ICD-10-CM

## 2021-04-21 DIAGNOSIS — Z3A27 27 weeks gestation of pregnancy: Secondary | ICD-10-CM | POA: Diagnosis not present

## 2021-04-21 DIAGNOSIS — Z3480 Encounter for supervision of other normal pregnancy, unspecified trimester: Secondary | ICD-10-CM

## 2021-04-21 DIAGNOSIS — Z302 Encounter for sterilization: Secondary | ICD-10-CM

## 2021-04-21 MED ORDER — CLINDAMYCIN HCL 150 MG PO CAPS: 150.0000 mg | ORAL_CAPSULE | Freq: Three times a day (TID) | ORAL | 0 refills | Status: DC | PRN

## 2021-04-21 NOTE — Progress Notes (Signed)
   LOW-RISK PREGNANCY VISIT Patient name: Loretta Moore MRN [id number]  Date of birth: [birth year] Chief Complaint:   Routine Prenatal Visit (PN2)  History of Present Illness:   Loretta Moore is a 25 y.o. G66P2002 female at [redacted]w[redacted]d with an Estimated Date of Delivery: 07/17/21 being seen today for ongoing management of a low-risk pregnancy.  Today she reports doing well; requesting clindamycin to be sent in again as she didn't pick up the previous rx in time; has fetal echo on June 9th (son had CHD that spont resolved). Contractions: Not present. Vag. Bleeding: None.  Movement: Present. denies leaking of fluid. Review of Systems:   Pertinent items are noted in HPI Denies abnormal vaginal discharge w/ itching/odor/irritation, headaches, visual changes, shortness of breath, chest pain, abdominal pain, severe nausea/vomiting, or problems with urination or bowel movements unless otherwise stated above. Pertinent History Reviewed:  Reviewed past medical,surgical, social, obstetrical and family history.  Reviewed problem list, medications and allergies. Physical Assessment:   Vitals:   04/21/21 0928  BP: (!) 122/59  Pulse: 96  Weight: 264 lb (119.7 kg)  Body mass index is 51.56 kg/m.        Physical Examination:   General appearance: Well appearing, and in no distress  Mental status: Alert, oriented to person, place, and time  Skin: Warm & dry  Cardiovascular: Normal heart rate noted  Respiratory: Normal respiratory effort, no distress  Abdomen: Soft, gravid, nontender  Pelvic: Cervical exam deferred         Extremities: Edema: Trace  Fetal Status: Fetal Heart Rate (bpm): 140 Fundal Height: 30 cm Movement: Present    No results found for this or any previous visit (from the past 24 hour(s)).  Assessment & Plan:  1) Low-risk pregnancy G3P2002 at [redacted]w[redacted]d with an Estimated Date of Delivery: 07/17/21   2) Desires BTL w rLTCS, papers signed today; will try to see [facility] next visit to discuss scar  revision  3) Son w CHD (spont resolved)> fetal echo sched for June 9th  4) Severe HS, Clindamycin refilled   Meds:  Meds ordered this encounter  Medications  . clindamycin (CLEOCIN) 150 MG capsule    Sig: Take 1 capsule (150 mg total) by mouth 3 (three) times daily as needed.    Dispense:  90 capsule    Refill:  0    Order Specific Question:   Supervising Provider    Answer:   Duane Lope H [[id number]]   Labs/procedures today: PN2, Tdap  Plan:  Continue routine obstetrical care   Reviewed: Preterm labor symptoms and general obstetric precautions including but not limited to vaginal bleeding, contractions, leaking of fluid and fetal movement were reviewed in detail with the patient.  All questions were answered. Has home bp cuff, but reads high; discussed placing on forearm and bringing in to next visit to check calibration. Check bp weekly, let us know if >140/90.   Follow-up: Return in about 3 weeks (around 05/12/2021) for LROB, in person w [facility], Sign BTL consent today.  No orders of the defined types were placed in this encounter.  Arabella Merles CNM 04/21/2021 10:11 AM

## 2021-04-21 NOTE — Addendum Note (Signed)
Addended by: Moss Mc on: 04/21/2021 10:38 AM   Modules accepted: Orders

## 2021-04-21 NOTE — Patient Instructions (Addendum)
Loretta Moore, I greatly value your feedback.  If you receive a survey following your visit with [place] today, we appreciate you taking the time to fill it out.  Thanks, Philipp Deputy, CNM   [facility] ([address] [place], [place] [address]) Entrance C, located off of [address] valet parking  Go to [email or web address] to register for FREE online childbirth classes   Call the office [phone]) or go to [facility] if:  You begin to have strong, frequent contractions  Your water breaks.  Sometimes it is a big gush of fluid, sometimes it is just a trickle that keeps getting your panties wet or running down your legs  You have vaginal bleeding.  It is normal to have a small amount of spotting if your cervix was checked.   You don't feel your baby moving like normal.  If you don't, get you something to eat and drink and lay down and focus on feeling your baby move.  You should feel at least 10 movements in 2 hours.  If you don't, you should call the office or go to [facility].    Tdap Vaccine  It is recommended that you get the Tdap vaccine during the third trimester of EACH pregnancy to help protect your baby from getting pertussis (whooping cough)  27-36 weeks is the BEST time to do this so that you can pass the protection on to your baby. During pregnancy is better than after pregnancy, but if you are unable to get it during pregnancy it will be offered at the hospital.   You can get this vaccine with [place], at the health department, your family doctor, or some local pharmacies  Everyone who will be around your baby should also be up-to-date on their vaccines before the baby comes. Adults (who are not pregnant) only need 1 dose of Tdap during adulthood.   [place] Pediatricians/Family Doctors:  [facility] [phone]            [facility] [phone]                 [facility] [phone]  (usually not accepting new patients unless you have family there already, you are always welcome to call and ask)       [facility] Department [phone]       [facility] Pediatricians/Family Doctors:   [facility]: [phone]  [facility]: [phone]  [facility]: [phone]  [facility] Doctors:   [facility]: [phone]   [facility]: [phone]  [facility] Doctors:  [facility]: [phone]   Home Blood Pressure Monitoring for Patients   Your provider has recommended that you check your blood pressure (BP) at least once a week at home. If you do not have a blood pressure cuff at home, one will be provided for you. Contact your provider if you have not received your monitor within 1 week.   Helpful Tips for Accurate Home Blood Pressure Checks  . Don't smoke, exercise, or drink caffeine 30 minutes before checking your BP . Use the restroom before checking your BP (a full bladder can raise your pressure) . Relax in a comfortable upright chair . Feet on the ground . Left arm resting comfortably on a flat surface at the level of your heart . Legs uncrossed . Back supported . Sit quietly and don't talk . Place the cuff on your bare arm .  Adjust snuggly, so that only two fingertips can fit between your skin and the top of the cuff . Check 2 readings separated by at least one minute . Keep a log of your BP readings . For a visual, please reference this diagram: http://ccnc.care/bpdiagram  Provider Name: [facility]     Phone: [phone]  Zone 1: ALL CLEAR  Continue to monitor your symptoms:  . BP reading is less than 140 (top number) or less than 90 (bottom number)  . No right upper stomach pain . No headaches or seeing spots . No feeling nauseated or throwing up . No swelling in face and hands  Zone 2: CAUTION Call your doctor's office for  any of the following:  . BP reading is greater than 140 (top number) or greater than 90 (bottom number)  . Stomach pain under your ribs in the middle or right side . Headaches or seeing spots . Feeling nauseated or throwing up . Swelling in face and hands  Zone 3: EMERGENCY  Seek immediate medical care if you have any of the following:  . BP reading is greater than160 (top number) or greater than 110 (bottom number) . Severe headaches not improving with Tylenol . Serious difficulty catching your breath . Any worsening symptoms from Zone 2   Third Trimester of Pregnancy The third trimester is from week 29 through week 42, months 7 through 9. The third trimester is a time when the fetus is growing rapidly. At the end of the ninth month, the fetus is about 20 inches in length and weighs 6-10 pounds.  BODY CHANGES Your body goes through many changes during pregnancy. The changes vary from woman to woman.   Your weight will continue to increase. You can expect to gain 25-35 pounds (11-16 kg) by the end of the pregnancy.  You may begin to get stretch marks on your hips, abdomen, and breasts.  You may urinate more often because the fetus is moving lower into your pelvis and pressing on your bladder.  You may develop or continue to have heartburn as a result of your pregnancy.  You may develop constipation because certain hormones are causing the muscles that push waste through your intestines to slow down.  You may develop hemorrhoids or swollen, bulging veins (varicose veins).  You may have pelvic pain because of the weight gain and pregnancy hormones relaxing your joints between the bones in your pelvis. Backaches may result from overexertion of the muscles supporting your posture.  You may have changes in your hair. These can include thickening of your hair, rapid growth, and changes in texture. Some women also have hair loss during or after pregnancy, or hair that feels dry or thin.  Your hair will most likely return to normal after your baby is born.  Your breasts will continue to grow and be tender. A yellow discharge may leak from your breasts called colostrum.  Your belly button may stick out.  You may feel short of breath because of your expanding uterus.  You may notice the fetus "dropping," or moving lower in your abdomen.  You may have a bloody mucus discharge. This usually occurs a few days to a week before labor begins.  Your cervix becomes thin and soft (effaced) near your due date. WHAT TO EXPECT AT YOUR PRENATAL EXAMS  You will have prenatal exams every 2 weeks until week 36. Then, you will have weekly prenatal exams. During a routine prenatal visit:  You will be  weighed to make sure you and the fetus are growing normally.  Your blood pressure is taken.  Your abdomen will be measured to track your baby's growth.  The fetal heartbeat will be listened to.  Any test results from the previous visit will be discussed.  You may have a cervical check near your due date to see if you have effaced. At around 36 weeks, your caregiver will check your cervix. At the same time, your caregiver will also perform a test on the secretions of the vaginal tissue. This test is to determine if a type of bacteria, Group B streptococcus, is present. Your caregiver will explain this further. Your caregiver may ask you:  What your birth plan is.  How you are feeling.  If you are feeling the baby move.  If you have had any abnormal symptoms, such as leaking fluid, bleeding, severe headaches, or abdominal cramping.  If you have any questions. Other tests or screenings that may be performed during your third trimester include:  Blood tests that check for low iron levels (anemia).  Fetal testing to check the health, activity level, and growth of the fetus. Testing is done if you have certain medical conditions or if there are problems during the pregnancy. FALSE  LABOR You may feel small, irregular contractions that eventually go away. These are called Braxton Hicks contractions, or false labor. Contractions may last for hours, days, or even weeks before true labor sets in. If contractions come at regular intervals, intensify, or become painful, it is best to be seen by your caregiver.  SIGNS OF LABOR   Menstrual-like cramps.  Contractions that are 5 minutes apart or less.  Contractions that start on the top of the uterus and spread down to the lower abdomen and back.  A sense of increased pelvic pressure or back pain.  A watery or bloody mucus discharge that comes from the vagina. If you have any of these signs before the 37th week of pregnancy, call your caregiver right away. You need to go to the hospital to get checked immediately. HOME CARE INSTRUCTIONS   Avoid all smoking, herbs, alcohol, and unprescribed drugs. These chemicals affect the formation and growth of the baby.  Follow your caregiver's instructions regarding medicine use. There are medicines that are either safe or unsafe to take during pregnancy.  Exercise only as directed by your caregiver. Experiencing uterine cramps is a good sign to stop exercising.  Continue to eat regular, healthy meals.  Wear a good support bra for breast tenderness.  Do not use hot tubs, steam rooms, or saunas.  Wear your seat belt at all times when driving.  Avoid raw meat, uncooked cheese, cat litter boxes, and soil used by cats. These carry germs that can cause birth defects in the baby.  Take your prenatal vitamins.  Try taking a stool softener (if your caregiver approves) if you develop constipation. Eat more high-fiber foods, such as fresh vegetables or fruit and whole grains. Drink plenty of fluids to keep your urine clear or pale yellow.  Take warm sitz baths to soothe any pain or discomfort caused by hemorrhoids. Use hemorrhoid cream if your caregiver approves.  If you develop varicose  veins, wear support Moore. Elevate your feet for 15 minutes, 3-4 times a day. Limit salt in your diet.  Avoid heavy lifting, wear low heal shoes, and practice good posture.  Rest a lot with your legs elevated if you have leg cramps or low back pain.  Visit your dentist if you have not gone during your pregnancy. Use a soft toothbrush to brush your teeth and be gentle when you floss.  A sexual relationship may be continued unless your caregiver directs you otherwise.  Do not travel far distances unless it is absolutely necessary and only with the approval of your caregiver.  Take prenatal classes to understand, practice, and ask questions about the labor and delivery.  Make a trial run to the hospital.  Pack your hospital bag.  Prepare the baby's nursery.  Continue to go to all your prenatal visits as directed by your caregiver. SEEK MEDICAL CARE IF:  You are unsure if you are in labor or if your water has broken.  You have dizziness.  You have mild pelvic cramps, pelvic pressure, or nagging pain in your abdominal area.  You have persistent nausea, vomiting, or diarrhea.  You have a bad smelling vaginal discharge.  You have pain with urination. SEEK IMMEDIATE MEDICAL CARE IF:   You have a fever.  You are leaking fluid from your vagina.  You have spotting or bleeding from your vagina.  You have severe abdominal cramping or pain.  You have rapid weight loss or gain.  You have shortness of breath with chest pain.  You notice sudden or extreme swelling of your face, hands, ankles, feet, or legs.  You have not felt your baby move in over an hour.  You have severe headaches that do not go away with medicine.  You have vision changes. Document Released: 11/15/2001 Document Revised: 11/26/2013 Document Reviewed: 01/22/2013 [facility] Patient Information 2015 [place], [place]. This information is not intended to replace advice given to you by your health care provider. Make  sure you discuss any questions you have with your health care provider.

## 2021-04-22 LAB — GLUCOSE TOLERANCE, 2 HOURS W/ 1HR
Glucose, 1 hour: 119 mg/dL (ref 65–179)
Glucose, 2 hour: 60 mg/dL — ABNORMAL LOW (ref 65–152)
Glucose, Fasting: 82 mg/dL (ref 65–91)

## 2021-04-22 LAB — CBC
Hematocrit: 30.7 % — ABNORMAL LOW (ref 34.0–46.6)
Hemoglobin: 10.2 g/dL — ABNORMAL LOW (ref 11.1–15.9)
MCH: 29.1 pg (ref 26.6–33.0)
MCHC: 33.2 g/dL (ref 31.5–35.7)
MCV: 88 fL (ref 79–97)
Platelets: 335 10*3/uL (ref 150–450)
RBC: 3.5 x10E6/uL — ABNORMAL LOW (ref 3.77–5.28)
RDW: 12.1 % (ref 11.7–15.4)
WBC: 7.8 10*3/uL (ref 3.4–10.8)

## 2021-04-22 LAB — HIV ANTIBODY (ROUTINE TESTING W REFLEX): HIV Screen 4th Generation wRfx: NONREACTIVE

## 2021-04-22 LAB — ANTIBODY SCREEN: Antibody Screen: NEGATIVE

## 2021-04-22 LAB — RPR: RPR Ser Ql: NONREACTIVE

## 2021-04-27 ENCOUNTER — Other Ambulatory Visit: Payer: Self-pay | Admitting: Women's Health

## 2021-04-27 ENCOUNTER — Telehealth: Payer: Self-pay | Admitting: Women's Health

## 2021-04-27 MED ORDER — FERROUS SULFATE 325 (65 FE) MG PO TABS: 325.0000 mg | ORAL_TABLET | ORAL | 2 refills | Status: DC

## 2021-04-27 NOTE — Telephone Encounter (Signed)
Patient states she noticed some very light pink spotting this morning but has not noticed any since. Had some mild cramping last night as well but none currently.  Baby is very active.  Intercourse was 2 days ago. Advised to continue to monitor and if bleeding returned along with increase in cramping or pressure, to please call our office or go to [facility] for evaluation. Offer patient an appointment if she wanted to be seen but patient declined.  Pt verbalized understanding.  Pt verbalized understanding and agreeable to plan.

## 2021-04-27 NOTE — Telephone Encounter (Signed)
Pt requesting Rx for prenatal vitamins  Please advise & notify pt    [facility]

## 2021-05-10 DIAGNOSIS — R202 Paresthesia of skin: Secondary | ICD-10-CM | POA: Diagnosis not present

## 2021-05-10 DIAGNOSIS — R200 Anesthesia of skin: Secondary | ICD-10-CM | POA: Diagnosis not present

## 2021-05-11 DIAGNOSIS — R202 Paresthesia of skin: Secondary | ICD-10-CM | POA: Diagnosis not present

## 2021-05-12 ENCOUNTER — Ambulatory Visit (INDEPENDENT_AMBULATORY_CARE_PROVIDER_SITE_OTHER): Payer: Medicaid Other | Admitting: Women's Health

## 2021-05-12 ENCOUNTER — Other Ambulatory Visit: Payer: Self-pay

## 2021-05-12 ENCOUNTER — Encounter: Payer: Self-pay | Admitting: Women's Health

## 2021-05-12 VITALS — BP 109/62 | HR 98 | Wt 264.2 lb

## 2021-05-12 DIAGNOSIS — O2342 Unspecified infection of urinary tract in pregnancy, second trimester: Secondary | ICD-10-CM | POA: Diagnosis not present

## 2021-05-12 DIAGNOSIS — Z3483 Encounter for supervision of other normal pregnancy, third trimester: Secondary | ICD-10-CM | POA: Diagnosis not present

## 2021-05-12 DIAGNOSIS — Z3480 Encounter for supervision of other normal pregnancy, unspecified trimester: Secondary | ICD-10-CM

## 2021-05-12 NOTE — Patient Instructions (Signed)
Io, thank you for choosing our office today! We appreciate the opportunity to meet your healthcare needs. You may receive a short survey by mail, e-mail, or through [employer]. If you are happy with your care we would appreciate if you could take just a few minutes to complete the survey questions. We read all of your comments and take your feedback very seriously. Thank you again for choosing our office.  Center for [employer] Team at [facility]  [facility] at [facility] ([address] [place], [place] [address]) Entrance C, located off of [address] Free 24/7 valet parking   CLASSES: Go to [email or web address] to register for classes (childbirth, breastfeeding, waterbirth, infant CPR, daddy bootcamp, etc.)  Call the office [phone]) or go to [facility] if:  You begin to have strong, frequent contractions  Your water breaks.  Sometimes it is a big gush of fluid, sometimes it is just a trickle that keeps getting your panties wet or running down your legs  You have vaginal bleeding.  It is normal to have a small amount of spotting if your cervix was checked.   You don't feel your baby moving like normal.  If you don't, get you something to eat and drink and lay down and focus on feeling your baby move.   If your baby is still not moving like normal, you should call the office or go to [facility].  Call the office [phone]) or go to [facility] hospital for these signs of pre-eclampsia:  Severe headache that does not go away with Tylenol  Visual changes- seeing spots, double, blurred vision  Pain under your right breast or upper abdomen that does not go away with Tums or heartburn medicine  Nausea and/or vomiting  Severe swelling in your hands, feet, and face   Tdap Vaccine  It is recommended that you get the Tdap vaccine during the third trimester of EACH pregnancy to help protect your baby from getting pertussis (whooping cough)  27-36 weeks is  the BEST time to do this so that you can pass the protection on to your baby. During pregnancy is better than after pregnancy, but if you are unable to get it during pregnancy it will be offered at the hospital.   You can get this vaccine with [place], at the health department, your family doctor, or some local pharmacies  Everyone who will be around your baby should also be up-to-date on their vaccines before the baby comes. Adults (who are not pregnant) only need 1 dose of Tdap during adulthood.   [facility] Pediatricians/Family Doctors  [facility]): [address] [place]lette Rib[place], [phone]            [facility]: [address], [phone]                 [facility]): [address], [phone] (call to ask if accepting patients)  [facility] Department: [address], [place], [phone]    [facility] Pediatricians/Family Doctors  [facility]): [phone] [address], [phone]  [facility]: [address] [place], [phone]  [facility]: [address], [phone]  [facility] Doctors   [facility]): [phone]  [facility]: [address] [phone]   [facility] Doctors  [facility]: [address] [phone]  [facility] Doctors  . [address]on Passey  [facility]: [address], [phone]  Home Blood Pressure Monitoring for Patients   Your provider has recommended that you check your blood pressure (BP) at least once a week at home. If you do not have a blood pressure cuff at home, one will be provided for you. Contact your provider if you have not received your monitor within 1 week.   Helpful Tips for Accurate Home Blood Pressure Checks  . Don't smoke, exercise, or drink caffeine 30 minutes before checking your BP . Use the restroom before  checking your BP (a full bladder can raise your pressure) . Relax in a comfortable upright chair . Feet on the ground . Left arm resting comfortably on a flat surface at the level of your heart . Legs uncrossed . Back supported . Sit quietly and don't talk . Place the cuff on your bare arm . Adjust snuggly, so that only two fingertips can fit between your skin and the top of the cuff . Check 2 readings separated by at least one minute . Keep a log of your BP readings . For a visual, please reference this diagram: http://ccnc.care/bpdiagram  Provider Name: [facility]     Phone: [phone]  Zone 1: ALL CLEAR  Continue to monitor your symptoms:  . BP reading is less than 140 (top number) or less than 90 (bottom number)  . No right upper stomach pain . No headaches or seeing spots . No feeling nauseated or throwing up . No swelling in face and hands  Zone 2: CAUTION Call your doctor's office for any of the following:  . BP reading is greater than 140 (top number) or greater than 90 (bottom number)  . Stomach pain under your ribs in the middle or right side . Headaches or seeing spots . Feeling nauseated or throwing up . Swelling in face and hands  Zone 3: EMERGENCY  Seek immediate medical care if you have any of the following:  . BP reading is greater than160 (top number) or greater than 110 (bottom number) . Severe headaches not improving with Tylenol . Serious difficulty catching your breath . Any worsening symptoms from Zone 2  Preterm Labor and Birth Information  The normal length of a pregnancy is 39-41 weeks. Preterm labor is when labor starts before 37 completed weeks of pregnancy. What are the risk factors for preterm labor? Preterm labor is more likely to occur in women who:  Have certain infections during pregnancy such as a bladder infection, sexually transmitted infection, or infection inside the uterus (chorioamnionitis).  Have a shorter-than-normal  cervix.  Have gone into preterm labor before.  Have had surgery on their cervix.  Are younger than age 61 or older than age 74.  Are African American.  Are pregnant with twins or multiple babies (multiple gestation).  Take street drugs or smoke while pregnant.  Do not gain enough weight while pregnant.  Became pregnant shortly after having been pregnant. What are the symptoms of preterm labor? Symptoms of preterm labor include:  Cramps similar to those that can happen during a menstrual period. The cramps may happen with diarrhea.  Pain in the abdomen or lower back.  Regular uterine contractions that may feel like tightening of the abdomen.  A feeling of increased pressure in the pelvis.  Increased watery or bloody mucus discharge from the vagina.  Water breaking (ruptured amniotic sac). Why is it important to recognize signs of preterm labor? It is important to recognize signs of  preterm labor because babies who are born prematurely may not be fully developed. This can put them at an increased risk for:  Long-term (chronic) heart and lung problems.  Difficulty immediately after birth with regulating body systems, including blood sugar, body temperature, heart rate, and breathing rate.  Bleeding in the brain.  Cerebral palsy.  Learning difficulties.  Death. These risks are highest for babies who are born before 34 weeks of pregnancy. How is preterm labor treated? Treatment depends on the length of your pregnancy, your condition, and the health of your baby. It may involve: 1. Having a stitch (suture) placed in your cervix to prevent your cervix from opening too early (cerclage). 2. Taking or being given medicines, such as: ? Hormone medicines. These may be given early in pregnancy to help support the pregnancy. ? Medicine to stop contractions. ? Medicines to help mature the baby's lungs. These may be prescribed if the risk of delivery is high. ? Medicines to  prevent your baby from developing cerebral palsy. If the labor happens before 34 weeks of pregnancy, you may need to stay in the hospital. What should I do if I think I am in preterm labor? If you think that you are going into preterm labor, call your health care provider right away. How can I prevent preterm labor in future pregnancies? To increase your chance of having a full-term pregnancy:  Do not use any tobacco products, such as cigarettes, chewing tobacco, and e-cigarettes. If you need help quitting, ask your health care provider.  Do not use street drugs or medicines that have not been prescribed to you during your pregnancy.  Talk with your health care provider before taking any herbal supplements, even if you have been taking them regularly.  Make sure you gain a healthy amount of weight during your pregnancy.  Watch for infection. If you think that you might have an infection, get it checked right away.  Make sure to tell your health care provider if you have gone into preterm labor before. This information is not intended to replace advice given to you by your health care provider. Make sure you discuss any questions you have with your health care provider. Document Revised: 03/15/2019 Document Reviewed: 04/13/2016 Elsevier Patient Education  2020 [employer].

## 2021-05-12 NOTE — Progress Notes (Addendum)
LOW-RISK PREGNANCY VISIT Patient name: Loretta Moore MRN [id number]  Date of birth: 1996/07/28 Chief Complaint:   Routine Prenatal Visit  History of Present Illness:   Loretta Moore is a 25 y.o. G76P2002 female at [redacted]w[redacted]d with an Estimated Date of Delivery: 07/17/21 being seen today for ongoing management of a low-risk pregnancy.   Today she reports occasional contractions, pelvic pressure and swelling. Contractions: Irritability. Vag. Bleeding: None.  Movement: Present. denies leaking of fluid.  Depression screen [facility] 2/9 04/21/2021 01/05/2021 04/30/2019  Decreased Interest 0 0 0  Down, Depressed, Hopeless 0 0 0  PHQ - 2 Score 0 0 0  Altered sleeping 2 0 1  Tired, decreased energy 1 0 1  Change in appetite 1 1 1   Feeling bad or failure about yourself  0 0 0  Trouble concentrating 0 0 0  Moving slowly or fidgety/restless 0 1 0  Suicidal thoughts 0 0 0  PHQ-9 Score 4 2 3      GAD 7 : Generalized Anxiety Score 04/21/2021 01/05/2021  Nervous, Anxious, on Edge 1 0  Control/stop worrying 0 0  Worry too much - different things 0 0  Trouble relaxing 0 0  Restless 0 0  Easily annoyed or irritable 1 0  Afraid - awful might happen 0 0  Total GAD 7 Score 2 0      Review of Systems:   Pertinent items are noted in HPI Denies abnormal vaginal discharge w/ itching/odor/irritation, headaches, visual changes, shortness of breath, chest pain, abdominal pain, severe nausea/vomiting, or problems with urination or bowel movements unless otherwise stated above. Pertinent History Reviewed:  Reviewed past medical,surgical, social, obstetrical and family history.  Reviewed problem list, medications and allergies. Physical Assessment:   Vitals:   05/12/21 1611  BP: 109/62  Pulse: 98  Weight: 264 lb 3.2 oz (119.8 kg)  Body mass index is 51.6 kg/m.        Physical Examination:   General appearance: Well appearing, and in no distress  Mental status: Alert, oriented to person, place, and  time  Skin: Warm & dry  Cardiovascular: Normal heart rate noted  Respiratory: Normal respiratory effort, no distress  Abdomen: Soft, gravid, nontender  Pelvic: Cervical exam deferred         Extremities: Edema: Trace  Fetal Status: Fetal Heart Rate (bpm): 158 Fundal Height: 32 cm Movement: Present    Chaperone: N/A   No results found for this or any previous visit (from the past 24 hour(s)).  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 Did report son born w/ hole in heart that spontaneously resolved, so has fetal echo scheduled tomorrow   Meds: No orders of the defined types were placed in this encounter.  Labs/procedures today: urine culture  Plan:  Continue routine obstetrical care  Next visit: prefers in person    Reviewed: Preterm labor symptoms and general obstetric precautions including but not limited to vaginal bleeding, contractions, leaking of fluid and fetal movement were reviewed in detail with the patient.  All questions were answered.  Follow-up: Return in about 2 weeks (around 05/26/2021) for LROB, CNM, in person.  No future appointments.  Orders Placed This Encounter  Procedures  . Urine Culture   Cheral Marker CNM, [facility] 05/12/2021 4:35 PM

## 2021-05-13 DIAGNOSIS — O358XX0 Maternal care for other (suspected) fetal abnormality and damage, not applicable or unspecified: Secondary | ICD-10-CM | POA: Diagnosis not present

## 2021-05-13 DIAGNOSIS — Z8279 Family history of other congenital malformations, deformations and chromosomal abnormalities: Secondary | ICD-10-CM | POA: Diagnosis not present

## 2021-05-13 DIAGNOSIS — Z3A30 30 weeks gestation of pregnancy: Secondary | ICD-10-CM | POA: Diagnosis not present

## 2021-05-15 LAB — URINE CULTURE

## 2021-05-17 ENCOUNTER — Other Ambulatory Visit: Payer: Self-pay | Admitting: Women's Health

## 2021-05-17 ENCOUNTER — Telehealth: Payer: Self-pay | Admitting: Women's Health

## 2021-05-17 MED ORDER — HYDROCORTISONE ACETATE 25 MG RE SUPP
25.0000 mg | Freq: Two times a day (BID) | RECTAL | 0 refills | Status: DC
Start: 2021-05-17 — End: 2021-05-17

## 2021-05-17 MED ORDER — CLINDAMYCIN HCL 300 MG PO CAPS: 300.0000 mg | ORAL_CAPSULE | Freq: Three times a day (TID) | ORAL | 0 refills | Status: DC

## 2021-05-17 NOTE — Addendum Note (Signed)
Addended by: Cheral Marker on: 05/17/2021 04:19 PM   Modules accepted: Orders

## 2021-05-17 NOTE — Telephone Encounter (Signed)
Patient called stating that she seen the results of her a test she had done and she would like to speak with a nurse regarding those results. Please contact pt

## 2021-05-27 ENCOUNTER — Other Ambulatory Visit: Payer: Self-pay

## 2021-05-27 ENCOUNTER — Encounter: Payer: Self-pay | Admitting: Obstetrics & Gynecology

## 2021-05-27 ENCOUNTER — Ambulatory Visit (INDEPENDENT_AMBULATORY_CARE_PROVIDER_SITE_OTHER): Payer: Medicaid Other | Admitting: Obstetrics & Gynecology

## 2021-05-27 VITALS — BP 129/70 | HR 100 | Wt 262.0 lb

## 2021-05-27 DIAGNOSIS — Z3A32 32 weeks gestation of pregnancy: Secondary | ICD-10-CM

## 2021-05-27 DIAGNOSIS — Z98891 History of uterine scar from previous surgery: Secondary | ICD-10-CM

## 2021-05-27 DIAGNOSIS — Z3483 Encounter for supervision of other normal pregnancy, third trimester: Secondary | ICD-10-CM

## 2021-05-27 NOTE — Progress Notes (Signed)
   LOW-RISK PREGNANCY VISIT Patient name: Loretta Moore MRN [id number]  Date of birth: 1996/07/22 Chief Complaint:   Routine Prenatal Visit  History of Present Illness:   Loretta Moore is a 25 y.o. G79P2002 female at [redacted]w[redacted]d with an Estimated Date of Delivery: 07/17/21 being seen today for ongoing management of a low-risk pregnancy.   -Prior C/S x2, plan for repeat with tubal ligation -atypical sex chromosomes, s/p MFM consult, declined amniocentesis -Obesity  Depression screen [facility] 2/9 04/21/2021 01/05/2021 04/30/2019  Decreased Interest 0 0 0  Down, Depressed, Hopeless 0 0 0  PHQ - 2 Score 0 0 0  Altered sleeping 2 0 1  Tired, decreased energy 1 0 1  Change in appetite 1 1 1   Feeling bad or failure about yourself  0 0 0  Trouble concentrating 0 0 0  Moving slowly or fidgety/restless 0 1 0  Suicidal thoughts 0 0 0  PHQ-9 Score 4 2 3     Today she reports  pelvic pressure . Contractions: Irritability. Vag. Bleeding: None.  Movement: Present. denies leaking of fluid. Review of Systems:   Pertinent items are noted in HPI Denies abnormal vaginal discharge w/ itching/odor/irritation, headaches, visual changes, shortness of breath, chest pain, abdominal pain, severe nausea/vomiting, or problems with urination or bowel movements unless otherwise stated above. Pertinent History Reviewed:  Reviewed past medical,surgical, social, obstetrical and family history.  Reviewed problem list, medications and allergies.  Physical Assessment:   Vitals:   05/27/21 1615  BP: 129/70  Pulse: 100  Weight: 262 lb (118.8 kg)  Body mass index is 51.17 kg/m.        Physical Examination:   General appearance: Well appearing, and in no distress  Mental status: Alert, oriented to person, place, and time  Skin: Warm & dry  Respiratory: Normal respiratory effort, no distress  Abdomen: Soft, gravid, nontender  Pelvic: Cervical exam deferred         Extremities: Edema: Trace  Psych:  mood and affect  appropriate  Fetal Status: Fetal Heart Rate (bpm): 140 Fundal Height: 34 cm Movement: Present    Chaperone: n/a    No results found for this or any previous visit (from the past 24 hour(s)).   Assessment & Plan:  1) Low-risk pregnancy G3P2002 at [redacted]w[redacted]d with an Estimated Date of Delivery: 07/17/21   2) Prior C-section x 2 -Plan to schedule Repeat C-section and tubal @ 39wks   Meds: No orders of the defined types were placed in this encounter.  Labs/procedures today: none  Plan:  Continue routine obstetrical care Next visit: prefers in person    Reviewed: Preterm labor symptoms and general obstetric precautions including but not limited to vaginal bleeding, contractions, leaking of fluid and fetal movement were reviewed in detail with the patient.  All questions were answered. Pt has home bp cuff. Check bp weekly, let [redacted]w[redacted]d know if >140/90.   Follow-up: Return in about 2 weeks (around 06/10/2021) for LROB visit.  No orders of the defined types were placed in this encounter.   [place], DO Attending Obstetrician & Gynecologist, [facility] for Myna Hidalgo, [facility]

## 2021-05-28 ENCOUNTER — Encounter: Payer: Self-pay | Admitting: *Deleted

## 2021-05-28 ENCOUNTER — Telehealth: Payer: Self-pay | Admitting: *Deleted

## 2021-05-28 NOTE — Telephone Encounter (Signed)
Call to patient. Left message with procedure date.  Patient immediately called back. Advised c-section scheduled for Mon, 07-12-21 at 1230, arrive at 1030. Advised letter will be mailed with hospital brochure and will receive pre-op call with instructions.  Encounter closed

## 2021-06-10 ENCOUNTER — Other Ambulatory Visit: Payer: Self-pay

## 2021-06-10 ENCOUNTER — Ambulatory Visit (INDEPENDENT_AMBULATORY_CARE_PROVIDER_SITE_OTHER): Payer: Medicaid Other | Admitting: Advanced Practice Midwife

## 2021-06-10 VITALS — BP 133/77 | HR 98 | Wt 257.0 lb

## 2021-06-10 DIAGNOSIS — Z3A34 34 weeks gestation of pregnancy: Secondary | ICD-10-CM

## 2021-06-10 DIAGNOSIS — Z3483 Encounter for supervision of other normal pregnancy, third trimester: Secondary | ICD-10-CM

## 2021-06-10 DIAGNOSIS — Z3480 Encounter for supervision of other normal pregnancy, unspecified trimester: Secondary | ICD-10-CM

## 2021-06-10 NOTE — Progress Notes (Addendum)
   LOW-RISK PREGNANCY VISIT Patient name: Loretta Moore MRN [id number]  Date of birth: 12/12/1995 Chief Complaint:   Routine Prenatal Visit  History of Present Illness:   Loretta Moore is a 25 y.o. G16P2002 female at [redacted]w[redacted]d with an Estimated Date of Delivery: 07/17/21 being seen today for ongoing management of a low-risk pregnancy.  Today she reports normal pg complaints. Contractions: Irritability. Vag. Bleeding: Bloody Show.  Movement: Present. denies leaking of fluid. Review of Systems:   Pertinent items are noted in HPI Denies abnormal vaginal discharge w/ itching/odor/irritation, headaches, visual changes, shortness of breath, chest pain, abdominal pain, severe nausea/vomiting, or problems with urination or bowel movements unless otherwise stated above. Pertinent History Reviewed:  Reviewed past medical,surgical, social, obstetrical and family history.  Reviewed problem list, medications and allergies. Physical Assessment:   Vitals:   06/10/21 1612  BP: 133/77  Pulse: 98  Weight: 257 lb (116.6 kg)  Body mass index is 50.19 kg/m.        Physical Examination:   General appearance: Well appearing, and in no distress  Mental status: Alert, oriented to person, place, and time  Skin: Warm & dry  Cardiovascular: Normal heart rate noted  Respiratory: Normal respiratory effort, no distress  Abdomen: Soft, gravid, nontender  Pelvic: Cervical exam deferred         Extremities: Edema: Trace  Fetal Status:     Movement: Present    Chaperone: n/a    No results found for this or any previous visit (from the past 24 hour(s)).  Assessment & Plan:  1) Low-risk pregnancy G3P2002 at [redacted]w[redacted]d with an Estimated Date of Delivery: 07/17/21    2) for rpt cs/btl, papers signed today  3 HS:  a few small spots on back. Looks pretty good   Meds: No orders of the defined types were placed in this encounter.  Labs/procedures today:   Plan:  Continue routine obstetrical care  Next visit: prefers  in person    Reviewed: Term labor symptoms and general obstetric precautions including but not limited to vaginal bleeding, contractions, leaking of fluid and fetal movement were reviewed in detail with the patient.  All questions were answered. Has home bp cuff.. Check bp weekly, let us know if >140/90.   Follow-up: Return in about 2 weeks (around 06/24/2021) for w.LHE for LROB, schedule CS, sign BTL form.  No orders of the defined types were placed in this encounter.  Jacklyn Shell DNP, CNM 06/10/2021 4:44 PM

## 2021-06-10 NOTE — Patient Instructions (Signed)

## 2021-06-14 ENCOUNTER — Encounter: Payer: Self-pay | Admitting: Advanced Practice Midwife

## 2021-06-14 ENCOUNTER — Other Ambulatory Visit: Payer: Self-pay

## 2021-06-14 ENCOUNTER — Encounter (HOSPITAL_COMMUNITY): Payer: Self-pay | Admitting: Family Medicine

## 2021-06-14 ENCOUNTER — Inpatient Hospital Stay (HOSPITAL_COMMUNITY)
Admission: AD | Admit: 2021-06-14 | Discharge: 2021-06-15 | Discharge status: Against Medical Advice | Payer: Medicaid Other | Attending: Obstetrics and Gynecology | Admitting: Obstetrics and Gynecology

## 2021-06-14 DIAGNOSIS — F17210 Nicotine dependence, cigarettes, uncomplicated: Secondary | ICD-10-CM | POA: Insufficient documentation

## 2021-06-14 DIAGNOSIS — O99891 Other specified diseases and conditions complicating pregnancy: Secondary | ICD-10-CM

## 2021-06-14 DIAGNOSIS — Z3480 Encounter for supervision of other normal pregnancy, unspecified trimester: Secondary | ICD-10-CM

## 2021-06-14 DIAGNOSIS — M549 Dorsalgia, unspecified: Secondary | ICD-10-CM | POA: Diagnosis not present

## 2021-06-14 DIAGNOSIS — O26893 Other specified pregnancy related conditions, third trimester: Secondary | ICD-10-CM | POA: Insufficient documentation

## 2021-06-14 DIAGNOSIS — B9689 Other specified bacterial agents as the cause of diseases classified elsewhere: Secondary | ICD-10-CM | POA: Diagnosis not present

## 2021-06-14 DIAGNOSIS — O23593 Infection of other part of genital tract in pregnancy, third trimester: Secondary | ICD-10-CM | POA: Insufficient documentation

## 2021-06-14 DIAGNOSIS — N760 Acute vaginitis: Secondary | ICD-10-CM

## 2021-06-14 DIAGNOSIS — O34211 Maternal care for low transverse scar from previous cesarean delivery: Secondary | ICD-10-CM | POA: Diagnosis not present

## 2021-06-14 DIAGNOSIS — O99334 Smoking (tobacco) complicating childbirth: Secondary | ICD-10-CM | POA: Insufficient documentation

## 2021-06-14 DIAGNOSIS — O212 Late vomiting of pregnancy: Secondary | ICD-10-CM | POA: Diagnosis not present

## 2021-06-14 DIAGNOSIS — Z3A35 35 weeks gestation of pregnancy: Secondary | ICD-10-CM

## 2021-06-14 DIAGNOSIS — Z302 Encounter for sterilization: Secondary | ICD-10-CM

## 2021-06-14 DIAGNOSIS — R197 Diarrhea, unspecified: Secondary | ICD-10-CM | POA: Insufficient documentation

## 2021-06-14 LAB — URINALYSIS, ROUTINE W REFLEX MICROSCOPIC
Bilirubin Urine: NEGATIVE
Glucose, UA: NEGATIVE mg/dL
Hgb urine dipstick: NEGATIVE
Ketones, ur: 20 mg/dL — AB
Leukocytes,Ua: NEGATIVE
Nitrite: NEGATIVE
Protein, ur: NEGATIVE mg/dL
Specific Gravity, Urine: 1.012 (ref 1.005–1.030)
pH: 6 (ref 5.0–8.0)

## 2021-06-14 NOTE — MAU Provider Note (Signed)
Chief Complaint:  Back Pain   HPI: Loretta Moore is a 25 y.o. G3P2002 at [redacted]w[redacted]d by L/9 who presents to maternity admissions reporting concern of nausea, vomiting and diarrhea in addition to vaginal pressure. Pt reports onset several days ago without any known triggers. The vaginal pressure started last night and "feels like the need to poop but not really needing to go". The vaginal pressure is intermittent. Pt had 2 bouts of emesis this morning but was then able to eat a cheeseburger without recurrent symptoms. Loose stool x4 today. No known sick contacts. No respiratory symptoms, headache, or vision changes. History of Cesarean x2 (first for non-reassuring fetal status and second was elective rLTCS). She reports good fetal movement, denies LOF, vaginal bleeding, vaginal itching/burning, urinary symptoms, h/a, dizziness, or fever/chills.    Past Medical History: Past Medical History:  Diagnosis Date   Anemia    Asthma    Hard of hearing    Hidradenitis suppurativa    had excisions in axilla and groin    Past obstetric history: OB History  Gravida Para Term Preterm AB Living  3 2 2     2   SAB IAB Ectopic Multiple Live Births        0 2    # Outcome Date GA Lbr Len/2nd Weight Sex Delivery Anes PTL Lv  3 Current           2 Term 10/26/19 [redacted]w[redacted]d  3090 g M CS-Vac Spinal  LIV  1 Term 08/29/15 [redacted]w[redacted]d  3351 g M CS-LTranv EPI N LIV     Birth Comments: Hgb, Normal, FA Newborn Screen Barcode: [redacted]w[redacted]d Date Collected: 08/30/2015     Complications: Fetal Intolerance    Past Surgical History: Past Surgical History:  Procedure Laterality Date   AXILLARY HIDRADENITIS EXCISION     CESAREAN SECTION N/A 08/29/2015   Procedure: CESAREAN SECTION;  Surgeon: 08/31/2015, DO;  Location: [facility];  Service: Obstetrics;  Laterality: N/A;   CESAREAN SECTION N/A 10/26/2019   Procedure: REPEAT CESAREAN SECTION;  Surgeon: 10/28/2019, MD;  Location: [facility];  Service: Obstetrics;  Laterality: N/A;    HYDRADENITIS EXCISION Bilateral 01/29/2019   Procedure: Excision Hidradentitis groin (right) and right and left flank;  Surgeon: 01/31/2019, MD;  Location: [facility];  Service: General;  Laterality: Bilateral;   INGUINAL HIDRADENITIS EXCISION     TONSILLECTOMY AND ADENOIDECTOMY      Family History: Family History  Problem Relation Age of Onset   Anemia Mother    Hypertension Mother    Other Mother        hidradenitis   Epilepsy Father    Hypertension Father    Other Sister        hidradenitis   Epilepsy Brother    Other Paternal Grandmother        aneursym   Cancer Paternal Grandfather    Hypertension Paternal Grandfather     Social History: Social History   Tobacco Use   Smoking status: Every Day    Packs/day: 0.50    Years: 3.00    Pack years: 1.50    Types: Cigarettes    Last attempt to quit: 06/02/2015    Years since quitting: 6.0   Smokeless tobacco: Never  Vaping Use   Vaping Use: Never used  Substance Use Topics   Alcohol use: Not Currently    Alcohol/week: 0.0 standard drinks    Comment: on weekends   Drug use: No  Allergies:  Allergies  Allergen Reactions   Bee Venom Anaphylaxis   Amoxicillin Hives   Augmentin [Amoxicillin-Pot Clavulanate] Hives   Ceclor [Cefaclor] Hives   Penicillins Hives    Has patient had a PCN reaction causing immediate rash, facial/tongue/throat swelling, SOB or lightheadedness with hypotension: Yes Has patient had a PCN reaction causing severe rash involving mucus membranes or skin necrosis: No Has patient had a PCN reaction that required hospitalization No Has patient had a PCN reaction occurring within the last 10 years: No If all of the above answers are "NO", then may proceed with Cephalosporin use.    Sulfa Antibiotics Hives   Tomato Hives   Other Rash    Paper tape causes rash   Oxycodone Itching    Meds:  No medications prior to admission.    ROS:  Review of Systems  Constitutional:   Negative for chills and fever.  HENT:  Negative for congestion and sore throat.   Eyes:  Negative for photophobia and visual disturbance.  Respiratory:  Negative for cough and shortness of breath.   Cardiovascular:  Negative for chest pain.  Gastrointestinal:  Positive for diarrhea, nausea and vomiting. Negative for abdominal pain.  Genitourinary:  Negative for dysuria, vaginal bleeding, vaginal discharge and vaginal pain.  Musculoskeletal:  Positive for back pain. Negative for arthralgias.  Neurological:  Negative for dizziness, seizures, light-headedness and headaches.    I have reviewed patient's Past Medical Hx, Surgical Hx, Family Hx, Social Hx, medications and allergies.   Physical Exam  Patient Vitals for the past 24 hrs:  BP Temp Temp src Pulse Resp SpO2 Height Weight  06/14/21 2336 -- -- -- -- -- 92 % -- --  06/14/21 2321 120/77 -- -- (!) 106 -- 94 % -- --  06/14/21 2145 (!) 103/46 98.1 F (36.7 C) Oral (!) 108 20 -- 5' (1.524 m) 117.2 kg   Constitutional: Well-developed, well-nourished female in no acute distress. Sitting up comfortably on stretcher. Cardiovascular: normal rate Respiratory: normal effort GI: Abd soft, non-tender, gravid appropriate for gestational age.  MS: Extremities nontender, no edema, normal ROM Neurologic: Alert and oriented x 4.  GU: Bimanual exam: Cervix FT/long/high, firm, posterior, neg CMT  Dilation: Fingertip Effacement (%): Thick Cervical Position: Posterior Exam by:: Lynnda ShieldsAnna Merridy Pascoe, MD  FHT:  Baseline 140, moderate variability, accelerations present, no decelerations Contractions: none   Labs: Results for orders placed or performed during the hospital encounter of 06/14/21 (from the past 24 hour(s))  Urinalysis, Routine w reflex microscopic Urine, Clean Catch     Status: Abnormal   Collection Time: 06/14/21  9:55 PM  Result Value Ref Range   Color, Urine YELLOW YELLOW   APPearance CLEAR CLEAR   Specific Gravity, Urine 1.012 1.005 -  1.030   pH 6.0 5.0 - 8.0   Glucose, UA NEGATIVE NEGATIVE mg/dL   Hgb urine dipstick NEGATIVE NEGATIVE   Bilirubin Urine NEGATIVE NEGATIVE   Ketones, ur 20 (A) NEGATIVE mg/dL   Protein, ur NEGATIVE NEGATIVE mg/dL   Nitrite NEGATIVE NEGATIVE   Leukocytes,Ua NEGATIVE NEGATIVE  Wet prep, genital     Status: Abnormal   Collection Time: 06/14/21 11:35 PM  Result Value Ref Range   Yeast Wet Prep HPF POC NONE SEEN NONE SEEN   Trich, Wet Prep NONE SEEN NONE SEEN   Clue Cells Wet Prep HPF POC PRESENT (A) NONE SEEN   WBC, Wet Prep HPF POC FEW (A) NONE SEEN   Sperm NONE SEEN    O/Positive/-- (02/01  1609)  Imaging:  No results found.  MAU Course/MDM: Orders Placed This Encounter  Procedures   Wet prep, genital   Urinalysis, Routine w reflex microscopic Urine, Clean Catch   Discharge patient Discharge disposition: 01-Home or Self Care; Discharge patient date: 06/15/2021    No orders of the defined types were placed in this encounter.   NST reviewed and reactive. Treatments in MAU included none. Pt discharge with strict return precautions for preterm contractions, vaginal bleeding, leakage of fluid, inability to tolerate po intake, or other concerns.  Assessment & Plan: Shanikia Kernodle is a 25 y.o. G3P2002 at [redacted]w[redacted]d by L/9 who presents to maternity admissions reporting concern of nausea, vomiting and diarrhea in addition to vaginal pressure. No signs/symptoms concerning for preterm labor. Reactive NST. Vital signs wnl. UA unremarkable and wet prep notable for +clue cells.  1. Supervision of other normal pregnancy, antepartum   2. Request for sterilization   3. Bacterial vaginosis: Pt left prior to discussion of wet prep. Given pt taking clindamycin TID x7d, no additional prescription for flagyl sent to pharmacy.  4. Diarrhea, unspecified type: Most likely secondary to viral GI illness given concurrent nausea and vomiting. Reassuringly, pt with normal vitals and ability to tolerance po intake  in MAU without any recurrent symptoms. Provided strict return precautions to return if inability to tolerate po intake or other concerns.   Discharge home with plan to f/u in clinic on 7/21 with Dr. Despina Hidden as previously scheduled. Labor precautions and fetal kick counts. Strict return precautions as noted above. Pt has repeat Cesarean+BTL scheduled for 07/12/21.  Allergies as of 06/15/2021       Reactions   Bee Venom Anaphylaxis   Amoxicillin Hives   Augmentin [amoxicillin-pot Clavulanate] Hives   Ceclor [cefaclor] Hives   Penicillins Hives   Has patient had a PCN reaction causing immediate rash, facial/tongue/throat swelling, SOB or lightheadedness with hypotension: Yes Has patient had a PCN reaction causing severe rash involving mucus membranes or skin necrosis: No Has patient had a PCN reaction that required hospitalization No Has patient had a PCN reaction occurring within the last 10 years: No If all of the above answers are "NO", then may proceed with Cephalosporin use.   Sulfa Antibiotics Hives   Tomato Hives   Other Rash   Paper tape causes rash   Oxycodone Itching        Medication List     TAKE these medications    albuterol (2.5 MG/3ML) 0.083% nebulizer solution Commonly known as: PROVENTIL Take 2.5 mg by nebulization every 6 (six) hours as needed for wheezing or shortness of breath.   albuterol 108 (90 Base) MCG/ACT inhaler Commonly known as: VENTOLIN HFA Inhale 1-2 puffs into the lungs every 6 (six) hours as needed for wheezing or shortness of breath.   aspirin 81 MG EC tablet Take 1 tablet (81 mg total) by mouth daily. Swallow whole.   CitraNatal Assure 35-1 & 300 MG tablet TAKE 1 PACKET BY MOUTH ONCE DAILY   clindamycin 300 MG capsule Commonly known as: CLEOCIN Take 1 capsule (300 mg total) by mouth 3 (three) times daily. X 7 days   ferrous sulfate 325 (65 FE) MG tablet Take 1 tablet (325 mg total) by mouth every other day.   silver sulfADIAZINE 1 %  cream Commonly known as: Silvadene Apply 1 application topically 2 (two) times daily.       ASK your doctor about these medications    Blood Pressure Monitor Misc For regular  home bp monitoring during pregnancy       Loeta Herst, Skipper Cliche, MD OB Fellow, [facility] 06/15/2021 6:29 AM

## 2021-06-14 NOTE — MAU Note (Signed)
PT SAYS SHE HAS HAD V/D - STARTED SAT  AT 2PM- FEELS LIKE SHE HAS TO HAVE BM- BUT NO STOOL FEELS LOWER BACK   AND HIPS ARE SORE.  PNC WITH [facility]

## 2021-06-15 LAB — GC/CHLAMYDIA PROBE AMP (~~LOC~~) NOT AT ARMC
Chlamydia: NEGATIVE
Comment: NEGATIVE
Comment: NORMAL
Neisseria Gonorrhea: NEGATIVE

## 2021-06-15 LAB — WET PREP, GENITAL
Sperm: NONE SEEN
Trich, Wet Prep: NONE SEEN
Yeast Wet Prep HPF POC: NONE SEEN

## 2021-06-15 NOTE — Discharge Instructions (Signed)
-  Your labs were positive for bacterial vaginosis. You are already taking clindamycin, which is a treatment for this condition. -Please follow-up with your prenatal provider as previously scheduled or present sooner if other concerns.

## 2021-06-24 ENCOUNTER — Ambulatory Visit (INDEPENDENT_AMBULATORY_CARE_PROVIDER_SITE_OTHER): Payer: Medicaid Other | Admitting: Obstetrics & Gynecology

## 2021-06-24 ENCOUNTER — Other Ambulatory Visit (HOSPITAL_COMMUNITY)
Admission: RE | Admit: 2021-06-24 | Discharge: 2021-06-24 | Disposition: A | Discharge status: Home / Self Care | Payer: Medicaid Other | Source: Ambulatory Visit | Attending: Obstetrics & Gynecology | Admitting: Obstetrics & Gynecology

## 2021-06-24 ENCOUNTER — Encounter: Payer: Self-pay | Admitting: Obstetrics & Gynecology

## 2021-06-24 ENCOUNTER — Other Ambulatory Visit: Payer: Self-pay

## 2021-06-24 VITALS — BP 126/78 | HR 104 | Wt 260.0 lb

## 2021-06-24 DIAGNOSIS — Z3A36 36 weeks gestation of pregnancy: Secondary | ICD-10-CM | POA: Insufficient documentation

## 2021-06-24 DIAGNOSIS — Z3483 Encounter for supervision of other normal pregnancy, third trimester: Secondary | ICD-10-CM

## 2021-06-24 NOTE — Progress Notes (Signed)
   LOW-RISK PREGNANCY VISIT Patient name: Loretta Moore MRN [id number]  Date of birth: 1996-03-20 Chief Complaint:   Routine Prenatal Visit  History of Present Illness:   Loretta Moore is a 25 y.o. G65P2002 female at [redacted]w[redacted]d with an Estimated Date of Delivery: 07/17/21 being seen today for ongoing management of a low-risk pregnancy.  Depression screen [facility] 2/9 04/21/2021 01/05/2021 04/30/2019  Decreased Interest 0 0 0  Down, Depressed, Hopeless 0 0 0  PHQ - 2 Score 0 0 0  Altered sleeping 2 0 1  Tired, decreased energy 1 0 1  Change in appetite 1 1 1   Feeling bad or failure about yourself  0 0 0  Trouble concentrating 0 0 0  Moving slowly or fidgety/restless 0 1 0  Suicidal thoughts 0 0 0  PHQ-9 Score 4 2 3     Today she reports no complaints. Contractions: Irritability. Vag. Bleeding: None.  Movement: Present. denies leaking of fluid. Review of Systems:   Pertinent items are noted in HPI Denies abnormal vaginal discharge w/ itching/odor/irritation, headaches, visual changes, shortness of breath, chest pain, abdominal pain, severe nausea/vomiting, or problems with urination or bowel movements unless otherwise stated above. Pertinent History Reviewed:  Reviewed past medical,surgical, social, obstetrical and family history.  Reviewed problem list, medications and allergies. Physical Assessment:   Vitals:   06/24/21 1613  BP: 126/78  Pulse: (!) 104  Weight: 260 lb (117.9 kg)  Body mass index is 50.78 kg/m.        Physical Examination:   General appearance: Well appearing, and in no distress  Mental status: Alert, oriented to person, place, and time  Skin: Warm & dry  Cardiovascular: Normal heart rate noted  Respiratory: Normal respiratory effort, no distress  Abdomen: Soft, gravid, nontender  Pelvic: Cervical exam performed         Extremities: Edema: Trace  Fetal Status:     Movement: Present    Chaperone: Amanda Rash    No results found for this or any previous visit (from  the past 24 hour(s)).  Assessment & Plan:  1) Low-risk pregnancy G3P2002 at [redacted]w[redacted]d with an Estimated Date of Delivery: 07/17/21   2) Previous C section scheduled for repeat and BTL 07/12/21,    Meds: No orders of the defined types were placed in this encounter.  Labs/procedures today: cultures  Plan:  Continue routine obstetrical care  Next visit: prefers in person    Reviewed: Term labor symptoms and general obstetric precautions including but not limited to vaginal bleeding, contractions, leaking of fluid and fetal movement were reviewed in detail with the patient.  All questions were answered. has home bp cuff. Rx faxed to . Check bp weekly, let 07/19/21 know if >140/90.   Follow-up: No follow-ups on file.  No orders of the defined types were placed in this encounter.   09/11/21, MD 06/24/2021 4:47 PM

## 2021-06-25 ENCOUNTER — Encounter (HOSPITAL_COMMUNITY): Payer: Self-pay

## 2021-06-25 NOTE — Patient Instructions (Addendum)
Loretta Moore  06/25/2021   Your procedure is scheduled on:  07/12/2021  Arrive at 1030 at Entrance C on [employer] at [facility]  and [employer]. You are invited to use the FREE valet parking or use the Visitor's parking deck.  Pick up the phone at the desk and dial [phone].  Call this number if you have problems the morning of surgery: [phone]  Remember:   Do not eat food:(After Midnight) Desps de medianoche.  Do not drink clear liquids: (After Midnight) Desps de medianoche.  Take these medicines the morning of surgery with A SIP OF WATER:  Take clindamycin as prescribed   Do not wear jewelry, make-up or nail polish.  Do not wear lotions, powders, or perfumes. Do not wear deodorant.  Do not shave 48 hours prior to surgery.  Do not bring valuables to the hospital.  [facility] is not   responsible for any belongings or valuables brought to the hospital.  Contacts, dentures or bridgework may not be worn into surgery.  Leave suitcase in the car. After surgery it may be brought to your room.  For patients admitted to the hospital, checkout time is 11:00 AM the day of              discharge.      Please read over the following fact sheets that you were given:     Preparing for Surgery

## 2021-06-28 LAB — CERVICOVAGINAL ANCILLARY ONLY
Chlamydia: NEGATIVE
Comment: NEGATIVE
Comment: NORMAL
Neisseria Gonorrhea: NEGATIVE

## 2021-06-29 ENCOUNTER — Ambulatory Visit (INDEPENDENT_AMBULATORY_CARE_PROVIDER_SITE_OTHER): Payer: Medicaid Other | Admitting: Obstetrics & Gynecology

## 2021-06-29 ENCOUNTER — Encounter: Payer: Self-pay | Admitting: Obstetrics & Gynecology

## 2021-06-29 ENCOUNTER — Other Ambulatory Visit: Payer: Self-pay

## 2021-06-29 VITALS — BP 123/71 | HR 108 | Wt 261.6 lb

## 2021-06-29 DIAGNOSIS — Z3A37 37 weeks gestation of pregnancy: Secondary | ICD-10-CM

## 2021-06-29 DIAGNOSIS — Z3483 Encounter for supervision of other normal pregnancy, third trimester: Secondary | ICD-10-CM

## 2021-06-29 DIAGNOSIS — Z98891 History of uterine scar from previous surgery: Secondary | ICD-10-CM

## 2021-06-29 MED ORDER — CLINDAMYCIN HCL 300 MG PO CAPS: 600.0000 mg | ORAL_CAPSULE | Freq: Three times a day (TID) | ORAL | 1 refills | Status: DC

## 2021-06-29 MED ORDER — ALBUTEROL SULFATE HFA 108 (90 BASE) MCG/ACT IN AERS: 1.0000 | INHALATION_SPRAY | Freq: Four times a day (QID) | RESPIRATORY_TRACT | 1 refills | Status: DC | PRN

## 2021-06-29 NOTE — Progress Notes (Signed)
LOW-RISK PREGNANCY VISIT Patient name: Loretta Moore MRN [id number]  Date of birth: [birth year] Chief Complaint:   Routine Prenatal Visit (HS- thinks cyst on thigh is infected)  History of Present Illness:   Loretta Moore is a 25 y.o. G38P2002 female at [redacted]w[redacted]d with an Estimated Date of Delivery: 07/17/21 being seen today for ongoing management of a low-risk pregnancy.   Preg c/b: -prior C-section x2, plan for repeat with BTL -HS- notes area on her leg that she is concerned for infection.  Notes considerable drainage last night s/p warm compresses.  Taking Clindamycin regularly  Depression screen [facility] 2/9 04/21/2021 01/05/2021 04/30/2019  Decreased Interest 0 0 0  Down, Depressed, Hopeless 0 0 0  PHQ - 2 Score 0 0 0  Altered sleeping 2 0 1  Tired, decreased energy 1 0 1  Change in appetite 1 1 1   Feeling bad or failure about yourself  0 0 0  Trouble concentrating 0 0 0  Moving slowly or fidgety/restless 0 1 0  Suicidal thoughts 0 0 0  PHQ-9 Score 4 2 3     Today she reports  see above . Contractions: Irregular. Vag. Bleeding: None.  Movement: Present. denies leaking of fluid. Review of Systems:   Pertinent items are noted in HPI Denies abnormal vaginal discharge w/ itching/odor/irritation, headaches, visual changes, shortness of breath, chest pain, abdominal pain, severe nausea/vomiting, or problems with urination or bowel movements unless otherwise stated above. Pertinent History Reviewed:  Reviewed past medical,surgical, social, obstetrical and family history.  Reviewed problem list, medications and allergies.  Physical Assessment:   Vitals:   06/29/21 1613  BP: 123/71  Pulse: (!) 108  Weight: 261 lb 9.6 oz (118.7 kg)  Body mass index is 51.09 kg/m.        Physical Examination:   General appearance: Well appearing, and in no distress  Mental status: Alert, oriented to person, place, and time  Skin: Warm & dry  Respiratory: Normal respiratory effort, no distress  Abdomen:  Soft, gravid, nontender  Pelvic: Cervical exam deferred         Extremities: Edema: Trace back of right thigh- 3cm small abscess noted- no drainage, minimal erythema, no inducration  Psych:  mood and affect appropriate  Fetal Status: Fetal Heart Rate (bpm): 135 Fundal Height: 37 cm Movement: Present    Chaperone: n/a    No results found for this or any previous visit (from the past 24 hour(s)).   Assessment & Plan:  1) Low-risk pregnancy G3P2002 at [redacted]w[redacted]d with an Estimated Date of Delivery: 07/17/21   2) HS- no evidence of active infection, Clindamycin refilled, pt aware of conservative management  Scheduled for repeat C_section and BTL   Meds:  Meds ordered this encounter  Medications   clindamycin (CLEOCIN) 300 MG capsule    Sig: Take 2 capsules (600 mg total) by mouth 3 (three) times daily. X 7 days    Dispense:  540 capsule    Refill:  1   albuterol (VENTOLIN HFA) 108 (90 Base) MCG/ACT inhaler    Sig: Inhale 1-2 puffs into the lungs every 6 (six) hours as needed for wheezing or shortness of breath.    Dispense:  1 g    Refill:  1   Labs/procedures today: none  Plan:  Continue routine obstetrical care  Next visit: prefers in person    Reviewed: Term labor symptoms and general obstetric precautions including but not limited to vaginal bleeding, contractions, leaking of fluid and fetal movement were reviewed  in detail with the patient.  All questions were answered. Pt has home bp cuff. Check bp weekly, let us know if >140/90.   Follow-up: Return in about 1 week (around 07/06/2021) for LROB visit.  No orders of the defined types were placed in this encounter.   Myna Hidalgo, DO Attending Obstetrician & Gynecologist, [facility] for [employer], [facility]

## 2021-06-30 ENCOUNTER — Encounter: Payer: Medicaid Other | Admitting: Obstetrics & Gynecology

## 2021-07-07 ENCOUNTER — Ambulatory Visit (INDEPENDENT_AMBULATORY_CARE_PROVIDER_SITE_OTHER): Payer: Medicaid Other | Admitting: Women's Health

## 2021-07-07 ENCOUNTER — Encounter: Payer: Self-pay | Admitting: Women's Health

## 2021-07-07 ENCOUNTER — Other Ambulatory Visit: Payer: Self-pay

## 2021-07-07 VITALS — BP 137/78 | HR 109 | Wt 263.2 lb

## 2021-07-07 DIAGNOSIS — Z3A38 38 weeks gestation of pregnancy: Secondary | ICD-10-CM

## 2021-07-07 DIAGNOSIS — H5213 Myopia, bilateral: Secondary | ICD-10-CM | POA: Diagnosis not present

## 2021-07-07 DIAGNOSIS — Z3483 Encounter for supervision of other normal pregnancy, third trimester: Secondary | ICD-10-CM

## 2021-07-07 NOTE — Progress Notes (Signed)
    LOW-RISK PREGNANCY VISIT Patient name: Loretta Moore MRN [id number]  Date of birth: [birth year] Chief Complaint:   Routine Prenatal Visit (Cervix check)  History of Present Illness:   Mardel Grudzien is a 25 y.o. G47P2002 female at [redacted]w[redacted]d with an Estimated Date of Delivery: 07/17/21 being seen today for ongoing management of a low-risk pregnancy.   Today she reports no complaints. Contractions: Irregular.  .  Movement: Present. denies leaking of fluid.  Depression screen [facility] 2/9 04/21/2021 01/05/2021 04/30/2019  Decreased Interest 0 0 0  Down, Depressed, Hopeless 0 0 0  PHQ - 2 Score 0 0 0  Altered sleeping 2 0 1  Tired, decreased energy 1 0 1  Change in appetite 1 1 1   Feeling bad or failure about yourself  0 0 0  Trouble concentrating 0 0 0  Moving slowly or fidgety/restless 0 1 0  Suicidal thoughts 0 0 0  PHQ-9 Score 4 2 3      GAD 7 : Generalized Anxiety Score 04/21/2021 01/05/2021  Nervous, Anxious, on Edge 1 0  Control/stop worrying 0 0  Worry too much - different things 0 0  Trouble relaxing 0 0  Restless 0 0  Easily annoyed or irritable 1 0  Afraid - awful might happen 0 0  Total GAD 7 Score 2 0      Review of Systems:   Pertinent items are noted in HPI Denies abnormal vaginal discharge w/ itching/odor/irritation, headaches, visual changes, shortness of breath, chest pain, abdominal pain, severe nausea/vomiting, or problems with urination or bowel movements unless otherwise stated above. Pertinent History Reviewed:  Reviewed past medical,surgical, social, obstetrical and family history.  Reviewed problem list, medications and allergies. Physical Assessment:   Vitals:   07/07/21 1603  BP: 137/78  Pulse: (!) 109  Weight: 263 lb 3.2 oz (119.4 kg)  Body mass index is 51.4 kg/m.        Physical Examination:   General appearance: Well appearing, and in no distress  Mental status: Alert, oriented to person, place, and time  Skin: Warm & dry  Cardiovascular: Normal  heart rate noted  Respiratory: Normal respiratory effort, no distress  Abdomen: Soft, gravid, nontender  Pelvic: Cervical exam performed  Dilation: Closed Effacement (%): Thick Station: Ballotable  Extremities: Edema: Trace  Fetal Status: Fetal Heart Rate (bpm): 160 Fundal Height: 40 cm Movement: Present Presentation: Vertex confirmed by  informal TA u/s  Chaperone: Peggy Dones   No results found for this or any previous visit (from the past 24 hour(s)).  Assessment & Plan:  1) Low-risk pregnancy G3P2002 at [redacted]w[redacted]d with an Estimated Date of Delivery: 07/17/21   2) Prev c/s x 2, for RCS 8/8 as scheduled   Meds: No orders of the defined types were placed in this encounter.  Labs/procedures today: SVE  Plan:  Continue routine obstetrical care  Next visit: n/a  Reviewed: Term labor symptoms and general obstetric precautions including but not limited to vaginal bleeding, contractions, leaking of fluid and fetal movement were reviewed in detail with the patient.  All questions were answered.   Follow-up: Return in about 12 days (around 07/19/2021) for 1wk post op incision check.  Future Appointments  Date Time Provider Department Center  07/09/2021 10:00 AM [facility] PAT 1 [facility] None  07/20/2021  3:50 PM 09/08/2021, DO [facility]    No orders of the defined types were placed in this encounter.  07/22/2021 CNM, [facility] 07/07/2021 4:55 PM

## 2021-07-07 NOTE — Patient Instructions (Signed)
Jaelene, thank you for choosing our office today! We appreciate the opportunity to meet your healthcare needs. You may receive a short survey by mail, e-mail, or through [employer]. If you are happy with your care we would appreciate if you could take just a few minutes to complete the survey questions. We read all of your comments and take your feedback very seriously. Thank you again for choosing our office.  Center for [employer] Team at [facility]  [facility] at [facility] ([address] [place], [place] [address]) Entrance C, located off of [address] Free 24/7 valet parking   CLASSES: Go to [email or web address] to register for classes (childbirth, breastfeeding, waterbirth, infant CPR, daddy bootcamp, etc.)  Call the office [phone]) or go to [facility] if: You begin to have strong, frequent contractions Your water breaks.  Sometimes it is a big gush of fluid, sometimes it is just a trickle that keeps getting your panties wet or running down your legs You have vaginal bleeding.  It is normal to have a small amount of spotting if your cervix was checked.  You don't feel your baby moving like normal.  If you don't, get you something to eat and drink and lay down and focus on feeling your baby move.   If your baby is still not moving like normal, you should call the office or go to [facility].  Call the office [phone]) or go to [facility] hospital for these signs of pre-eclampsia: Severe headache that does not go away with Tylenol Visual changes- seeing spots, double, blurred vision Pain under your right breast or upper abdomen that does not go away with Tums or heartburn medicine Nausea and/or vomiting Severe swelling in your hands, feet, and face   [facility] Pediatricians/Family Doctors [facility]): [address] Colette Ribas, [phone]           [facility]: [address], [phone]                 [facility]): [address], [phone] (call to ask if accepting patients) [facility] Department: [address], [place], [phone]    [facility] Pediatricians/Family Doctors [facility]): [address], [phone] [facility]: [address] [place], [phone] [facility]: [address], [phone]  [facility] Doctors  [facility]): [phone] [facility]: [address], [phone]   [facility] Doctors [facility]: [address] [phone]  [facility] Doctors  [facility]: [phone], [phone]  Home Blood Pressure Monitoring for Patients   Your provider has recommended that you check your blood pressure (BP) at least once a week at home. If you do not have a blood pressure cuff at home, one will be provided for you. Contact your provider if you have not received your monitor within 1 week.   Helpful Tips for Accurate Home Blood Pressure Checks  Don't smoke, exercise, or drink caffeine 30 minutes before checking your BP Use the restroom before checking your BP (a full bladder can raise your pressure) Relax in a comfortable upright chair Feet on the ground Left arm resting comfortably on a flat surface at the level of your heart Legs uncrossed Back supported Sit quietly and don't talk Place the cuff on your bare arm Adjust snuggly, so that only two fingertips  can fit between your skin and the top of the cuff Check 2 readings separated by at least one minute Keep a log of your BP readings For a visual, please reference this diagram: http://ccnc.care/bpdiagram  Provider Name: [facility]     Phone: [phone]  Zone 1: ALL CLEAR  Continue to monitor your symptoms:  BP reading is less than 140 (top number) or less than 90 (bottom number)  No right  upper stomach pain No headaches or seeing spots No feeling nauseated or throwing up No swelling in face and hands  Zone 2: CAUTION Call your doctor's office for any of the following:  BP reading is greater than 140 (top number) or greater than 90 (bottom number)  Stomach pain under your ribs in the middle or right side Headaches or seeing spots Feeling nauseated or throwing up Swelling in face and hands  Zone 3: EMERGENCY  Seek immediate medical care if you have any of the following:  BP reading is greater than160 (top number) or greater than 110 (bottom number) Severe headaches not improving with Tylenol Serious difficulty catching your breath Any worsening symptoms from Zone 2   Braxton Hicks Contractions Contractions of the uterus can occur throughout pregnancy, but they are not always a sign that you are in labor. You may have practice contractions called Braxton Hicks contractions. These false labor contractions are sometimes confused with true labor. What are [name] contractions? Braxton Hicks contractions are tightening movements that occur in the muscles of the uterus before labor. Unlike true labor contractions, these contractions do not result in opening (dilation) and thinning of the cervix. Toward the end of pregnancy (32-34 weeks), Braxton Hicks contractions can happen more often and may become stronger. These contractions are sometimes difficult to tell apart from true labor because they can be very uncomfortable. You should not feel embarrassed if you go to the hospital with false labor. Sometimes, the only way to tell if you are in true labor is for your health care provider to look for changes in the cervix. The health care provider will do a physical exam and may monitor your contractions. If you are not in true labor, the exam should show that your cervix is not dilating and your water has not broken. If there are no other health problems associated with your  pregnancy, it is completely safe for you to be sent home with false labor. You may continue to have Braxton Hicks contractions until you go into true labor. How to tell the difference between true labor and false labor True labor Contractions last 30-70 seconds. Contractions become very regular. Discomfort is usually felt in the top of the uterus, and it spreads to the lower abdomen and low back. Contractions do not go away with walking. Contractions usually become more intense and increase in frequency. The cervix dilates and gets thinner. False labor Contractions are usually shorter and not as strong as true labor contractions. Contractions are usually irregular. Contractions are often felt in the front of the lower abdomen and in the groin. Contractions may go away when you walk around or change positions while lying down. Contractions get weaker and are shorter-lasting as time goes on. The cervix usually does not dilate or become thin. Follow these instructions at home:  Take over-the-counter and prescription medicines only as told by your health care provider. Keep up with your usual exercises and follow other instructions from your health care provider. Eat and drink lightly if you think  you are going into labor. If Braxton Hicks contractions are making you uncomfortable: Change your position from lying down or resting to walking, or change from walking to resting. Sit and rest in a tub of warm water. Drink enough fluid to keep your urine pale yellow. Dehydration may cause these contractions. Do slow and deep breathing several times an hour. Keep all follow-up prenatal visits as told by your health care provider. This is important. Contact a health care provider if: You have a fever. You have continuous pain in your abdomen. Get help right away if: Your contractions become stronger, more regular, and closer together. You have fluid leaking or gushing from your vagina. You pass  blood-tinged mucus (bloody show). You have bleeding from your vagina. You have low back pain that you never had before. You feel your baby's head pushing down and causing pelvic pressure. Your baby is not moving inside you as much as it used to. Summary Contractions that occur before labor are called Braxton Hicks contractions, false labor, or practice contractions. Braxton Hicks contractions are usually shorter, weaker, farther apart, and less regular than true labor contractions. True labor contractions usually become progressively stronger and regular, and they become more frequent. Manage discomfort from [facility] contractions by changing position, resting in a warm bath, drinking plenty of water, or practicing deep breathing. This information is not intended to replace advice given to you by your health care provider. Make sure you discuss any questions you have with your health care provider. Document Revised: 11/03/2017 Document Reviewed: 04/06/2017 Elsevier Patient Education  [place].

## 2021-07-09 ENCOUNTER — Encounter (HOSPITAL_COMMUNITY)
Admission: RE | Admit: 2021-07-09 | Discharge: 2021-07-09 | Disposition: A | Discharge status: Home / Self Care | Payer: Medicaid Other | Source: Ambulatory Visit | Attending: Obstetrics and Gynecology | Admitting: Obstetrics and Gynecology

## 2021-07-09 ENCOUNTER — Other Ambulatory Visit: Payer: Self-pay | Admitting: Family Medicine

## 2021-07-09 ENCOUNTER — Other Ambulatory Visit: Payer: Self-pay

## 2021-07-09 DIAGNOSIS — Z01812 Encounter for preprocedural laboratory examination: Secondary | ICD-10-CM | POA: Diagnosis not present

## 2021-07-09 LAB — COMPREHENSIVE METABOLIC PANEL
ALT: 14 U/L (ref 0–44)
AST: 21 U/L (ref 15–41)
Albumin: 2.7 g/dL — ABNORMAL LOW (ref 3.5–5.0)
Alkaline Phosphatase: 131 U/L — ABNORMAL HIGH (ref 38–126)
Anion gap: 11 (ref 5–15)
BUN: 5 mg/dL — ABNORMAL LOW (ref 6–20)
CO2: 21 mmol/L — ABNORMAL LOW (ref 22–32)
Calcium: 9 mg/dL (ref 8.9–10.3)
Chloride: 102 mmol/L (ref 98–111)
Creatinine, Ser: 0.49 mg/dL (ref 0.44–1.00)
GFR, Estimated: >60 mL/min (ref >60)
Glucose, Bld: 67 mg/dL — ABNORMAL LOW (ref 70–99)
Potassium: 3.7 mmol/L (ref 3.5–5.1)
Sodium: 134 mmol/L — ABNORMAL LOW (ref 135–145)
Total Bilirubin: 0.6 mg/dL (ref 0.3–1.2)
Total Protein: 7 g/dL (ref 6.5–8.1)

## 2021-07-09 LAB — CBC
HCT: 35.1 % — ABNORMAL LOW (ref 36.0–46.0)
Hemoglobin: 11.7 g/dL — ABNORMAL LOW (ref 12.0–15.0)
MCH: 29.3 pg (ref 26.0–34.0)
MCHC: 33.3 g/dL (ref 30.0–36.0)
MCV: 88 fL (ref 80.0–100.0)
Platelets: 372 10*3/uL (ref 150–400)
RBC: 3.99 MIL/uL (ref 3.87–5.11)
RDW: 15.4 % (ref 11.5–15.5)
WBC: 6 10*3/uL (ref 4.0–10.5)
nRBC: 0 % (ref 0.0–0.2)

## 2021-07-09 LAB — TYPE AND SCREEN
ABO/RH(D): O POS
Antibody Screen: NEGATIVE

## 2021-07-10 LAB — RPR: RPR Ser Ql: NONREACTIVE

## 2021-07-10 LAB — SARS CORONAVIRUS 2 (TAT 6-24 HRS): SARS Coronavirus 2: NEGATIVE

## 2021-07-11 MED ORDER — CLINDAMYCIN PHOSPHATE 900 MG/50ML IV SOLN
900.0000 mg | INTRAVENOUS | Status: AC
  Administered 2021-07-12: 900 mg via INTRAVENOUS

## 2021-07-11 MED ORDER — GENTAMICIN SULFATE 40 MG/ML IJ SOLN
5.0000 mg/kg | INTRAVENOUS | Status: AC
  Administered 2021-07-12: 590 mg via INTRAVENOUS
  Filled 2021-07-11: qty 14.75

## 2021-07-12 ENCOUNTER — Encounter (HOSPITAL_COMMUNITY)
Admission: RE | Disposition: A | Discharge status: Home / Self Care | Payer: Self-pay | Source: Home / Self Care | Attending: Family Medicine

## 2021-07-12 ENCOUNTER — Encounter (HOSPITAL_COMMUNITY): Payer: Self-pay | Admitting: Family Medicine

## 2021-07-12 ENCOUNTER — Inpatient Hospital Stay (HOSPITAL_COMMUNITY)
Admission: RE | Admit: 2021-07-12 | Discharge: 2021-07-14 | DRG: 787 | Disposition: A | Discharge status: Home / Self Care | Payer: Medicaid Other | Attending: Family Medicine | Admitting: Family Medicine

## 2021-07-12 ENCOUNTER — Inpatient Hospital Stay (HOSPITAL_COMMUNITY): Payer: Medicaid Other | Admitting: Anesthesiology

## 2021-07-12 ENCOUNTER — Other Ambulatory Visit: Payer: Self-pay

## 2021-07-12 DIAGNOSIS — J45909 Unspecified asthma, uncomplicated: Secondary | ICD-10-CM | POA: Diagnosis present

## 2021-07-12 DIAGNOSIS — O99824 Streptococcus B carrier state complicating childbirth: Secondary | ICD-10-CM | POA: Diagnosis present

## 2021-07-12 DIAGNOSIS — O99334 Smoking (tobacco) complicating childbirth: Secondary | ICD-10-CM | POA: Diagnosis present

## 2021-07-12 DIAGNOSIS — O99214 Obesity complicating childbirth: Secondary | ICD-10-CM | POA: Diagnosis present

## 2021-07-12 DIAGNOSIS — O99324 Drug use complicating childbirth: Secondary | ICD-10-CM | POA: Diagnosis present

## 2021-07-12 DIAGNOSIS — O9902 Anemia complicating childbirth: Secondary | ICD-10-CM | POA: Diagnosis present

## 2021-07-12 DIAGNOSIS — R8271 Bacteriuria: Secondary | ICD-10-CM | POA: Diagnosis present

## 2021-07-12 DIAGNOSIS — O321XX0 Maternal care for breech presentation, not applicable or unspecified: Secondary | ICD-10-CM | POA: Diagnosis present

## 2021-07-12 DIAGNOSIS — O34211 Maternal care for low transverse scar from previous cesarean delivery: Secondary | ICD-10-CM | POA: Diagnosis not present

## 2021-07-12 DIAGNOSIS — Z30430 Encounter for insertion of intrauterine contraceptive device: Secondary | ICD-10-CM | POA: Diagnosis not present

## 2021-07-12 DIAGNOSIS — Z880 Allergy status to penicillin: Secondary | ICD-10-CM

## 2021-07-12 DIAGNOSIS — Z3A39 39 weeks gestation of pregnancy: Secondary | ICD-10-CM

## 2021-07-12 DIAGNOSIS — F17210 Nicotine dependence, cigarettes, uncomplicated: Secondary | ICD-10-CM | POA: Diagnosis present

## 2021-07-12 DIAGNOSIS — O285 Abnormal chromosomal and genetic finding on antenatal screening of mother: Secondary | ICD-10-CM | POA: Diagnosis present

## 2021-07-12 DIAGNOSIS — Z98891 History of uterine scar from previous surgery: Secondary | ICD-10-CM

## 2021-07-12 DIAGNOSIS — Z302 Encounter for sterilization: Secondary | ICD-10-CM | POA: Diagnosis not present

## 2021-07-12 DIAGNOSIS — Z30014 Encounter for initial prescription of intrauterine contraceptive device: Secondary | ICD-10-CM | POA: Diagnosis not present

## 2021-07-12 DIAGNOSIS — F1290 Cannabis use, unspecified, uncomplicated: Secondary | ICD-10-CM | POA: Diagnosis present

## 2021-07-12 DIAGNOSIS — O9952 Diseases of the respiratory system complicating childbirth: Secondary | ICD-10-CM | POA: Diagnosis present

## 2021-07-12 DIAGNOSIS — O99820 Streptococcus B carrier state complicating pregnancy: Secondary | ICD-10-CM | POA: Diagnosis not present

## 2021-07-12 DIAGNOSIS — Z3480 Encounter for supervision of other normal pregnancy, unspecified trimester: Secondary | ICD-10-CM

## 2021-07-12 DIAGNOSIS — Z3490 Encounter for supervision of normal pregnancy, unspecified, unspecified trimester: Secondary | ICD-10-CM

## 2021-07-12 HISTORY — PX: INTRAUTERINE DEVICE (IUD) INSERTION: SHX5877

## 2021-07-12 SURGERY — Surgical Case
Anesthesia: Spinal | Site: Uterus | Wound class: Clean Contaminated

## 2021-07-12 MED ORDER — SCOPOLAMINE 1 MG/3DAYS TD PT72
1.0000 | MEDICATED_PATCH | Freq: Once | TRANSDERMAL | Status: DC
  Administered 2021-07-12: 1.5 mg via TRANSDERMAL

## 2021-07-12 MED ORDER — LACTATED RINGERS IV SOLN: INTRAVENOUS | Status: DC

## 2021-07-12 MED ORDER — POVIDONE-IODINE 10 % EX SWAB
2.0000 "application " | Freq: Once | CUTANEOUS | Status: AC
  Administered 2021-07-12: 2 via TOPICAL

## 2021-07-12 MED ORDER — LEVONORGESTREL 20.1 MCG/DAY IU IUD
1.0000 | INTRAUTERINE_SYSTEM | Freq: Once | INTRAUTERINE | Status: AC
  Administered 2021-07-12: 1 via INTRAUTERINE

## 2021-07-12 MED ORDER — SIMETHICONE 80 MG PO CHEW
80.0000 mg | CHEWABLE_TABLET | ORAL | Status: DC | PRN
  Administered 2021-07-13: 80 mg via ORAL
  Filled 2021-07-12: qty 1

## 2021-07-12 MED ORDER — SOD CITRATE-CITRIC ACID 500-334 MG/5ML PO SOLN
ORAL | Status: AC
  Filled 2021-07-12: qty 30

## 2021-07-12 MED ORDER — IBUPROFEN 600 MG PO TABS
600.0000 mg | ORAL_TABLET | Freq: Four times a day (QID) | ORAL | Status: DC
  Administered 2021-07-13 – 2021-07-14 (×4): 600 mg via ORAL
  Filled 2021-07-12 (×4): qty 1

## 2021-07-12 MED ORDER — DIPHENHYDRAMINE HCL 50 MG/ML IJ SOLN
INTRAMUSCULAR | Status: DC | PRN
  Administered 2021-07-12: 12.5 mg via INTRAVENOUS

## 2021-07-12 MED ORDER — BUPIVACAINE IN DEXTROSE 0.75-8.25 % IT SOLN
INTRATHECAL | Status: DC | PRN
  Administered 2021-07-12: 1.6 mL via INTRATHECAL

## 2021-07-12 MED ORDER — KETOROLAC TROMETHAMINE 30 MG/ML IJ SOLN
30.0000 mg | Freq: Four times a day (QID) | INTRAMUSCULAR | Status: AC
  Administered 2021-07-13 (×3): 30 mg via INTRAVENOUS
  Filled 2021-07-12 (×3): qty 1

## 2021-07-12 MED ORDER — PHENYLEPHRINE 40 MCG/ML (10ML) SYRINGE FOR IV PUSH (FOR BLOOD PRESSURE SUPPORT)
PREFILLED_SYRINGE | INTRAVENOUS | Status: AC
  Filled 2021-07-12: qty 10

## 2021-07-12 MED ORDER — ONDANSETRON HCL 4 MG/2ML IJ SOLN
INTRAMUSCULAR | Status: AC
  Filled 2021-07-12: qty 2

## 2021-07-12 MED ORDER — STERILE WATER FOR IRRIGATION IR SOLN
Status: DC | PRN
  Administered 2021-07-12: 1000 mL

## 2021-07-12 MED ORDER — SODIUM CHLORIDE 0.9% FLUSH: 3.0000 mL | INTRAVENOUS | Status: DC | PRN

## 2021-07-12 MED ORDER — HYDROCODONE-ACETAMINOPHEN 5-325 MG PO TABS
1.0000 | ORAL_TABLET | ORAL | Status: DC | PRN
  Administered 2021-07-13 – 2021-07-14 (×6): 2 via ORAL
  Filled 2021-07-12 (×6): qty 2

## 2021-07-12 MED ORDER — NICOTINE 14 MG/24HR TD PT24: 14.0000 mg | MEDICATED_PATCH | Freq: Every day | TRANSDERMAL | Status: DC

## 2021-07-12 MED ORDER — DIPHENHYDRAMINE HCL 25 MG PO CAPS
25.0000 mg | ORAL_CAPSULE | ORAL | Status: DC | PRN
  Filled 2021-07-12 (×2): qty 1

## 2021-07-12 MED ORDER — FENTANYL CITRATE (PF) 100 MCG/2ML IJ SOLN
INTRAMUSCULAR | Status: DC | PRN
  Administered 2021-07-12: 15 ug via INTRATHECAL

## 2021-07-12 MED ORDER — MORPHINE SULFATE (PF) 0.5 MG/ML IJ SOLN
INTRAMUSCULAR | Status: DC | PRN
  Administered 2021-07-12: .15 mg via INTRATHECAL

## 2021-07-12 MED ORDER — CLINDAMYCIN PHOSPHATE 900 MG/50ML IV SOLN
INTRAVENOUS | Status: AC
  Filled 2021-07-12: qty 50

## 2021-07-12 MED ORDER — METOCLOPRAMIDE HCL 5 MG/ML IJ SOLN
INTRAMUSCULAR | Status: DC | PRN
  Administered 2021-07-12: 10 mg via INTRAVENOUS

## 2021-07-12 MED ORDER — NALOXONE HCL 4 MG/10ML IJ SOLN
1.0000 ug/kg/h | INTRAVENOUS | Status: DC | PRN
  Filled 2021-07-12: qty 5

## 2021-07-12 MED ORDER — MORPHINE SULFATE (PF) 0.5 MG/ML IJ SOLN
INTRAMUSCULAR | Status: AC
  Filled 2021-07-12: qty 10

## 2021-07-12 MED ORDER — KETOROLAC TROMETHAMINE 30 MG/ML IJ SOLN
30.0000 mg | Freq: Four times a day (QID) | INTRAMUSCULAR | Status: DC | PRN
  Administered 2021-07-12: 30 mg via INTRAMUSCULAR

## 2021-07-12 MED ORDER — LIDOCAINE-EPINEPHRINE 2 %-1:100000 IJ SOLN
INTRAMUSCULAR | Status: DC | PRN
Start: 2021-07-12 — End: 2021-07-12
  Administered 2021-07-12: 5 mL

## 2021-07-12 MED ORDER — KETOROLAC TROMETHAMINE 30 MG/ML IJ SOLN
INTRAMUSCULAR | Status: AC
  Filled 2021-07-12: qty 1

## 2021-07-12 MED ORDER — ACETAMINOPHEN 10 MG/ML IV SOLN
INTRAVENOUS | Status: DC | PRN
  Administered 2021-07-12: 1000 mg via INTRAVENOUS

## 2021-07-12 MED ORDER — LEVONORGESTREL 20.1 MCG/DAY IU IUD
INTRAUTERINE_SYSTEM | INTRAUTERINE | Status: AC
  Filled 2021-07-12: qty 1

## 2021-07-12 MED ORDER — DEXAMETHASONE SODIUM PHOSPHATE 4 MG/ML IJ SOLN
INTRAMUSCULAR | Status: DC | PRN
  Administered 2021-07-12: 4 mg via INTRAVENOUS

## 2021-07-12 MED ORDER — ONDANSETRON HCL 4 MG/2ML IJ SOLN
INTRAMUSCULAR | Status: DC | PRN
  Administered 2021-07-12: 4 mg via INTRAVENOUS

## 2021-07-12 MED ORDER — DIPHENHYDRAMINE HCL 50 MG/ML IJ SOLN
INTRAMUSCULAR | Status: AC
  Filled 2021-07-12: qty 1

## 2021-07-12 MED ORDER — KETOROLAC TROMETHAMINE 30 MG/ML IJ SOLN: 30.0000 mg | Freq: Four times a day (QID) | INTRAMUSCULAR | Status: DC | PRN

## 2021-07-12 MED ORDER — OXYTOCIN-SODIUM CHLORIDE 30-0.9 UT/500ML-% IV SOLN: 2.5000 [IU]/h | INTRAVENOUS | Status: AC

## 2021-07-12 MED ORDER — WITCH HAZEL-GLYCERIN EX PADS: 1.0000 "application " | MEDICATED_PAD | CUTANEOUS | Status: DC | PRN

## 2021-07-12 MED ORDER — COCONUT OIL OIL: 1.0000 "application " | TOPICAL_OIL | Status: DC | PRN

## 2021-07-12 MED ORDER — SENNOSIDES-DOCUSATE SODIUM 8.6-50 MG PO TABS
2.0000 | ORAL_TABLET | ORAL | Status: DC
  Administered 2021-07-13 – 2021-07-14 (×2): 2 via ORAL
  Filled 2021-07-12 (×2): qty 2

## 2021-07-12 MED ORDER — FENTANYL CITRATE (PF) 100 MCG/2ML IJ SOLN
INTRAMUSCULAR | Status: AC
  Filled 2021-07-12: qty 2

## 2021-07-12 MED ORDER — ENOXAPARIN SODIUM 60 MG/0.6ML IJ SOSY
60.0000 mg | PREFILLED_SYRINGE | INTRAMUSCULAR | Status: DC
  Administered 2021-07-13 – 2021-07-14 (×2): 60 mg via SUBCUTANEOUS
  Filled 2021-07-12 (×2): qty 0.6

## 2021-07-12 MED ORDER — DIBUCAINE (PERIANAL) 1 % EX OINT: 1.0000 "application " | TOPICAL_OINTMENT | CUTANEOUS | Status: DC | PRN

## 2021-07-12 MED ORDER — OXYTOCIN-SODIUM CHLORIDE 30-0.9 UT/500ML-% IV SOLN
INTRAVENOUS | Status: AC
  Filled 2021-07-12: qty 500

## 2021-07-12 MED ORDER — OXYTOCIN-SODIUM CHLORIDE 30-0.9 UT/500ML-% IV SOLN
INTRAVENOUS | Status: DC | PRN
  Administered 2021-07-12 (×2): 30 [IU] via INTRAVENOUS

## 2021-07-12 MED ORDER — SOD CITRATE-CITRIC ACID 500-334 MG/5ML PO SOLN
30.0000 mL | ORAL | Status: AC
  Administered 2021-07-12: 30 mL via ORAL

## 2021-07-12 MED ORDER — FENTANYL CITRATE (PF) 100 MCG/2ML IJ SOLN: 25.0000 ug | INTRAMUSCULAR | Status: DC | PRN

## 2021-07-12 MED ORDER — MEPERIDINE HCL 25 MG/ML IJ SOLN: 6.2500 mg | INTRAMUSCULAR | Status: DC | PRN

## 2021-07-12 MED ORDER — NALOXONE HCL 0.4 MG/ML IJ SOLN: 0.4000 mg | INTRAMUSCULAR | Status: DC | PRN

## 2021-07-12 MED ORDER — DEXAMETHASONE SODIUM PHOSPHATE 4 MG/ML IJ SOLN
INTRAMUSCULAR | Status: AC
  Filled 2021-07-12: qty 1

## 2021-07-12 MED ORDER — ONDANSETRON HCL 4 MG/2ML IJ SOLN: 4.0000 mg | Freq: Three times a day (TID) | INTRAMUSCULAR | Status: DC | PRN

## 2021-07-12 MED ORDER — PHENYLEPHRINE HCL-NACL 20-0.9 MG/250ML-% IV SOLN
INTRAVENOUS | Status: DC | PRN
  Administered 2021-07-12: 60 ug/min via INTRAVENOUS

## 2021-07-12 MED ORDER — PRENATAL MULTIVITAMIN CH
1.0000 | ORAL_TABLET | Freq: Every day | ORAL | Status: DC
  Administered 2021-07-13 – 2021-07-14 (×2): 1 via ORAL
  Filled 2021-07-12 (×2): qty 1

## 2021-07-12 MED ORDER — PHENYLEPHRINE HCL (PRESSORS) 10 MG/ML IV SOLN
INTRAVENOUS | Status: DC | PRN
  Administered 2021-07-12 (×4): 80 ug via INTRAVENOUS

## 2021-07-12 MED ORDER — SCOPOLAMINE 1 MG/3DAYS TD PT72
MEDICATED_PATCH | TRANSDERMAL | Status: AC
  Filled 2021-07-12: qty 1

## 2021-07-12 MED ORDER — MENTHOL 3 MG MT LOZG: 1.0000 | LOZENGE | OROMUCOSAL | Status: DC | PRN

## 2021-07-12 MED ORDER — SODIUM CHLORIDE 0.9 % IR SOLN
Status: DC | PRN
  Administered 2021-07-12: 1000 mL

## 2021-07-12 MED ORDER — DIPHENHYDRAMINE HCL 25 MG PO CAPS
25.0000 mg | ORAL_CAPSULE | Freq: Four times a day (QID) | ORAL | Status: DC | PRN
  Administered 2021-07-13: 25 mg via ORAL

## 2021-07-12 MED ORDER — DIPHENHYDRAMINE HCL 50 MG/ML IJ SOLN: 12.5000 mg | INTRAMUSCULAR | Status: DC | PRN

## 2021-07-12 MED ORDER — SIMETHICONE 80 MG PO CHEW
80.0000 mg | CHEWABLE_TABLET | Freq: Three times a day (TID) | ORAL | Status: DC
  Administered 2021-07-13 – 2021-07-14 (×5): 80 mg via ORAL
  Filled 2021-07-12 (×5): qty 1

## 2021-07-12 SURGICAL SUPPLY — 40 items
CHLORAPREP W/TINT 26ML (MISCELLANEOUS) ×4 IMPLANT
CLAMP CORD UMBIL (MISCELLANEOUS) IMPLANT
CLOTH BEACON ORANGE TIMEOUT ST (SAFETY) ×4 IMPLANT
DRESSING PREVENA PLUS CUSTOM (GAUZE/BANDAGES/DRESSINGS) ×3 IMPLANT
DRSG OPSITE POSTOP 4X10 (GAUZE/BANDAGES/DRESSINGS) ×4 IMPLANT
DRSG PREVENA PLUS CUSTOM (GAUZE/BANDAGES/DRESSINGS) ×4
ELECT REM PT RETURN 9FT ADLT (ELECTROSURGICAL) ×4
ELECTRODE REM PT RTRN 9FT ADLT (ELECTROSURGICAL) ×3 IMPLANT
EXTRACTOR VACUUM BELL CUP MITY (SUCTIONS) ×4 IMPLANT
EXTRACTOR VACUUM BELL STYLE (SUCTIONS) IMPLANT
GLOVE BIOGEL PI IND STRL 7.0 (GLOVE) ×6 IMPLANT
GLOVE BIOGEL PI INDICATOR 7.0 (GLOVE) ×2
GLOVE ECLIPSE 7.0 STRL STRAW (GLOVE) ×4 IMPLANT
GOWN STRL REUS W/TWL LRG LVL3 (GOWN DISPOSABLE) ×8 IMPLANT
HEMOSTAT ARISTA ABSORB 3G PWDR (HEMOSTASIS) ×4 IMPLANT
KIT ABG SYR 3ML LUER SLIP (SYRINGE) ×4 IMPLANT
NEEDLE HYPO 25X5/8 SAFETYGLIDE (NEEDLE) ×4 IMPLANT
NS IRRIG 1000ML POUR BTL (IV SOLUTION) ×4 IMPLANT
PACK C SECTION WH (CUSTOM PROCEDURE TRAY) ×4 IMPLANT
PAD OB MATERNITY 4.3X12.25 (PERSONAL CARE ITEMS) ×4 IMPLANT
PENCIL SMOKE EVAC W/HOLSTER (ELECTROSURGICAL) ×4 IMPLANT
RETRACTOR TRAXI PANNICULUS (MISCELLANEOUS) ×3 IMPLANT
RTRCTR C-SECT PINK 25CM LRG (MISCELLANEOUS) ×4 IMPLANT
SUT MNCRL 0 VIOLET CTX 36 (SUTURE) ×6 IMPLANT
SUT MON AB 3-0 SH 27 (SUTURE) ×1
SUT MON AB 3-0 SH27 (SUTURE) ×3 IMPLANT
SUT MONOCRYL 0 CTX 36 (SUTURE) ×2
SUT PLAIN 0 NONE (SUTURE) IMPLANT
SUT PLAIN 2 0 (SUTURE)
SUT PLAIN 2 0 XLH (SUTURE) ×4 IMPLANT
SUT PLAIN ABS 2-0 CT1 27XMFL (SUTURE) IMPLANT
SUT VIC AB 0 CTX 36 (SUTURE) ×1
SUT VIC AB 0 CTX36XBRD ANBCTRL (SUTURE) ×3 IMPLANT
SUT VIC AB 2-0 CT1 27 (SUTURE) ×1
SUT VIC AB 2-0 CT1 TAPERPNT 27 (SUTURE) ×3 IMPLANT
SUT VIC AB 4-0 KS 27 (SUTURE) ×4 IMPLANT
TOWEL OR 17X24 6PK STRL BLUE (TOWEL DISPOSABLE) ×4 IMPLANT
TRAXI PANNICULUS RETRACTOR (MISCELLANEOUS) ×1
TRAY FOLEY W/BAG SLVR 14FR LF (SET/KITS/TRAYS/PACK) IMPLANT
WATER STERILE IRR 1000ML POUR (IV SOLUTION) ×4 IMPLANT

## 2021-07-12 NOTE — Op Note (Signed)
Operative Note   Patient: Loretta Moore  Date of Procedure: 07/12/2021  Procedure: Repeat Low Transverse Cesarean and Post-placental Liletta IUD placement    Indications: previous uterine incision: low transverse x2  Pre-operative Diagnosis: previous cesarean section, desires Liletta IUD insertion for contraception.   Post-operative Diagnosis: Same and IUD insertion: Liletta , small uterine window  TOLAC Candidate: No, patient advised not to pursue further pregnancies and to deliver at 37 weeks in the future  Surgeon: Surgeon(s) and Role:    * Miran Kautzman, Mary Sella, MD - Primary    * Warner Mccreedy, MD - Assisting  An experienced assistant was required given the standard of surgical care given the complexity of the case.  This assistant was needed for exposure, dissection, suctioning, retraction, instrument exchange, assisting with delivery with administration of fundal pressure, and for overall help during the procedure.   Anesthesia: CSE  Anesthesiologist: Dr. Malen Gauze  Antibiotics: Gentamicin and Clindamycin   Estimated Blood Loss: 589 ml   Total IV Fluids: 2000 ml  Urine Output:  350 cc OF clear urine  Specimens: none   Complications: no complications   Indications: Loretta Moore is a 25 y.o. P5F1638 with an IUP [redacted]w[redacted]d presenting for scheduled cesarean secondary to the indications listed above.  The risks of cesarean section discussed with the patient included but were not limited to: bleeding which may require transfusion or reoperation; infection which may require antibiotics; injury to bowel, bladder, ureters or other surrounding organs; injury to the fetus; need for additional procedures including hysterectomy in the event of a life-threatening hemorrhage; placental abnormalities with subsequent pregnancies, incisional problems, thromboembolic phenomenon and other postoperative/anesthesia complications. The patient concurred with the proposed plan, giving informed written  consent for the procedure. Patient has been NPO since last night she will remain NPO for procedure. Anesthesia and OR aware. Preoperative prophylactic antibiotics and SCDs ordered on call to the OR.   Patient also consented for post-placental IUD insertion, counseled on risk of irregular/reduced bleeding, infection, expulsion leading to possible unintended pregnancy.   Findings: Viable infant in cephalic presentation, no nuchal cord present. Apgars 8 , 7 , 9 . Weight 3340 g . Clear amniotic fluid. Normal placenta, three vessel cord.  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  Procedure Details: A Time Out was held and the above information confirmed. The patient received intravenous antibiotics and had sequential compression devices applied to her lower extremities preoperatively. The patient was taken back to the operative suite where CSE anesthesia was administered. After induction of anesthesia, the patient was draped and prepped in the usual sterile manner and placed in a dorsal supine position with a leftward tilt. A low transverse skin incision was made with scalpel at the superior edge of her prior scar and carried down through the subcutaneous tissue to the fascia. Fascial incision was made and extended transversely with some difficulty due to dense adhesions in the subcutaneous tissue. The fascia was separated from the underlying rectus tissue superiorly and inferiorly. The rectus muscles were separated in the midline bluntly and the peritoneum was entered bluntly. An Alexis retractor was placed to aid in visualization of the uterus. The utero-vesical peritoneal reflection was incised transversely and the bladder flap was bluntly freed from the lower uterine segment due to the presence of a moderate amount of adhesive disease on the anterior surface of the uterus. A small  uterine window was noted  close to the inferior edge of the bladder flap. A low transverse uterine incision was made above this window. The infant was successfully delivered from cephalic presentation with the assistance of a Mityvac bell vacuum with one pull and no pop offs, the umbilical cord was clamped after 1 minute. Cord ph was not sent, and cord blood was obtained for evaluation. The placenta was removed Intact and appeared normal.   A Liletta IUD was then removed from it's packaging in a sterile manner and the strings were trimmed to approximately 10 cm. The IUD was placed manually at the uterine fundus and the strings were passed through the cervical os with a Kelly clamp.   The uterine incision was closed with running locked sutures of 0-Monocryl, and then a second imbricating layer was also placed with 0-Monocryl. A serosal bleeder was noted superior and lateral to the R corner, and this was ligated with 3-0 monocryl on an SH needle with good effect. Due to ongoing oozing, a layer of Arista was placed with good effect. The abdomen and the pelvis were cleared of all clot and debris and irrigated, and the Alexis was removed. Hemostasis was confirmed on all surfaces.  The peritoneum was was not reapproximated. At this point a bleeding portion of the R rectus muscle was noted and a single figure of eight suture was placed with good effect. The fascia was then closed using 0 Vicryl in a running fashion. At the R corner after attempted placement of a Kocher clamp significant bleeding was noted from the muscle, and a single figure of eight suture was placed with 2-0 vicryl with good effect. The subcutaneous layer was reapproximated with plain gut and the skin was closed with a 4-0 vicryl subcuticular stitch. The patient tolerated the procedure well. Sponge, lap, instrument and needle counts were correct x 2. She was taken to the recovery room in stable condition.  Disposition: PACU - hemodynamically stable.     Signed: Venora Maples, MD, MPH Center for [facility])

## 2021-07-12 NOTE — Lactation Note (Signed)
This note was copied from a baby's chart. Lactation Consultation Note  Patient Name: Loretta Moore [id number]'[id number] Date: 07/12/2021 Reason for consult: Initial assessment;Mother's request Age:27 hours  LC reviewed with mother feeding cues, keeping infant STS and looking for signs of milk transfer.   Infant latch score of 8.   Plan 1. To feed based on cues 8-12x in 24 hr period no more than 4 hrs without an attempt. Mom to offer both breasts in a feeding.  2.. If unable to get infant to latch, mom to offer ebm via spoon.  3. I and O sheet reviewed.  4 LC brochure of inpatient and outpatient services reviewed.  All questions answered at the end of the visit.   Maternal Data Has patient been taught Hand Expression?: Yes Does the patient have breastfeeding experience prior to this delivery?: Yes How long did the patient breastfeed?: first child 3-4 months low milk supply, second child for 6-7 months had to d/c due to allergies.  Feeding Mother's Current Feeding Choice: Breast Milk  LATCH Score Latch: Repeated attempts needed to sustain latch, nipple held in mouth throughout feeding, stimulation needed to elicit sucking reflex.  Audible Swallowing: A few with stimulation  Type of Nipple: Everted at rest and after stimulation  Comfort (Breast/Nipple): Soft / non-tender  Hold (Positioning): Assistance needed to correctly position infant at breast and maintain latch.  LATCH Score: 7   Lactation Tools Discussed/Used    Interventions Interventions: Breast feeding basics reviewed;Breast compression;Assisted with latch;Adjust position;Skin to skin;Support pillows;Position options;Hand express;Expressed milk;Education  Discharge Pump: Personal  Consult Status Consult Status: Follow-up Date: 07/13/21 Follow-up type: In-patient    Loretta Moore  Loretta Moore 07/12/2021, 7:30 PM

## 2021-07-12 NOTE — Anesthesia Postprocedure Evaluation (Signed)
Anesthesia Post Note  Patient: Education officer, environmental  Procedure(s) Performed: CESAREAN SECTION (Abdomen) INTRAUTERINE DEVICE (IUD) INSERTION (Uterus)     Patient location during evaluation: PACU Anesthesia Type: Spinal Level of consciousness: oriented and awake and alert Pain management: pain level controlled Vital Signs Assessment: post-procedure vital signs reviewed and stable Respiratory status: spontaneous breathing, respiratory function stable and nonlabored ventilation Cardiovascular status: blood pressure returned to baseline and stable Postop Assessment: no headache, no backache, no apparent nausea or vomiting, spinal receding and patient able to bend at knees Anesthetic complications: no   No notable events documented.  Last Vitals:  Vitals:   07/12/21 1515 07/12/21 1530  BP: 110/68 112/70  Pulse: 77 77  Resp: 18 14  Temp:    SpO2: 99% 97%    Last Pain:  Vitals:   07/12/21 1530  TempSrc:   PainSc: Asleep   Pain Goal:    LLE Motor Response: Purposeful movement (07/12/21 1530) LLE Sensation: Numbness, Tingling (07/12/21 1530) RLE Motor Response: Purposeful movement (07/12/21 1530) RLE Sensation: Numbness, Tingling (07/12/21 1530)     Epidural/Spinal Function Cutaneous sensation: Tingles (07/12/21 1500), Patient able to flex knees: No (07/12/21 1500), Patient able to lift hips off bed: No (07/12/21 1500), Back pain beyond tenderness at insertion site: No (07/12/21 1500), Progressively worsening motor and/or sensory loss: No (07/12/21 1500), Bowel and/or bladder incontinence post epidural: No (07/12/21 1500)  Callan Norden A.

## 2021-07-12 NOTE — H&P (Signed)
OBSTETRIC ADMISSION HISTORY AND PHYSICAL  Anielle Headrick is a 25 y.o. female G3P2002 with IUP at [redacted]w[redacted]d by LMP presenting for scheduled repeat cesarean section and BTL. She reports +FMs, No LOF, no VB, no blurry vision, headaches or peripheral edema, and RUQ pain.  She plans on breast feeding. She request BTL for birth control. 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  Past Medical History: Past Medical History:  Diagnosis Date   Anemia    Asthma    Hard of hearing    Hidradenitis suppurativa    had excisions in axilla and groin    Past Surgical History: Past Surgical History:  Procedure Laterality Date   AXILLARY HIDRADENITIS EXCISION     CESAREAN SECTION N/A 08/29/2015   Procedure: CESAREAN SECTION;  Surgeon: Truett Mainland, DO;  Location: [facility];  Service: Obstetrics;  Laterality: N/A;   CESAREAN SECTION N/A 10/26/2019   Procedure: REPEAT CESAREAN SECTION;  Surgeon: Florian Buff, MD;  Location: [facility];  Service: Obstetrics;  Laterality: N/A;   HYDRADENITIS EXCISION Bilateral 01/29/2019   Procedure: Excision Hidradentitis groin (right) and right and left flank;  Surgeon: Erroll Luna, MD;  Location: [facility];  Service: General;  Laterality: Bilateral;   INGUINAL HIDRADENITIS EXCISION     TONSILLECTOMY AND ADENOIDECTOMY      Obstetrical History: OB History     Gravida  3   Para  2   Term  2   Preterm      AB      Living  2      SAB      IAB      Ectopic      Multiple  0   Live Births  2           Social History Social History   Socioeconomic History   Marital status: Single    Spouse name: Not on file    Number of children: 1   Years of education: Not on file   Highest education level: Some college, no degree  Occupational History   Not on file  Tobacco Use   Smoking status: Every Day    Packs/day: 0.50    Years: 3.00    Pack years: 1.50    Types: Cigarettes    Last attempt to quit: 06/02/2015    Years since quitting: 6.1   Smokeless tobacco: Never  Vaping Use   Vaping Use: Never used  Substance and Sexual Activity   Alcohol use: Not Currently    Alcohol/week: 0.0 standard drinks    Comment: on weekends   Drug use: No   Sexual activity: Yes    Birth control/protection: None  Other Topics Concern   Not on file  Social History Narrative   Not on file   Social Determinants of Health   Financial Resource Strain: Low Risk    Difficulty of Paying Living Expenses: Not very hard  Food Insecurity: Food Insecurity Present   Worried About Running Out of Food in the Last Year: Sometimes true   Ran Out of Food in the Last Year: Sometimes true  Transportation Needs: Unmet Transportation Needs   Lack of Transportation (Medical): Yes  Lack of Transportation (Non-Medical): No  Physical Activity: Insufficiently Active   Days of Exercise per Week: 2 days   Minutes of Exercise per Session: 60 min  Stress: No Stress Concern Present   Feeling of Stress : Not at all  Social Connections: Socially Integrated   Frequency of Communication with Friends and Family: More than three times a week   Frequency of Social Gatherings with Friends and Family: Once a week   Attends Religious Services: 1 to 4 times per year   Active Member of [employer] or Organizations: No   Attends Music therapist: 1 to 4 times per year   Marital Status: Married    Family History: Family History  Problem Relation Age of Onset   Anemia Mother    Hypertension Mother    Other Mother        hidradenitis   Epilepsy Father    Hypertension Father    Other Sister        hidradenitis   Epilepsy Brother     Other Paternal Grandmother        aneursym   Cancer Paternal Grandfather    Hypertension Paternal Grandfather     Allergies: Allergies  Allergen Reactions   Bee Venom Anaphylaxis   Amoxicillin Hives   Augmentin [Amoxicillin-Pot Clavulanate] Hives   Ceclor [Cefaclor] Hives   Penicillins Hives    Has patient had a PCN reaction causing immediate rash, facial/tongue/throat swelling, SOB or lightheadedness with hypotension: Yes Has patient had a PCN reaction causing severe rash involving mucus membranes or skin necrosis: No Has patient had a PCN reaction that required hospitalization No Has patient had a PCN reaction occurring within the last 10 years: No If all of the above answers are "NO", then may proceed with Cephalosporin use.    Sulfa Antibiotics Hives   Tomato Hives   Other Rash    Paper tape causes rash   Oxycodone Itching    Medications Prior to Admission  Medication Sig Dispense Refill Last Dose   albuterol (PROVENTIL) (2.5 MG/3ML) 0.083% nebulizer solution Take 2.5 mg by nebulization every 6 (six) hours as needed for wheezing or shortness of breath. (Patient not taking: Reported on 07/07/2021)      albuterol (VENTOLIN HFA) 108 (90 Base) MCG/ACT inhaler Inhale 1-2 puffs into the lungs every 6 (six) hours as needed for wheezing or shortness of breath. (Patient not taking: Reported on 07/07/2021) 1 g 1    aspirin 81 MG EC tablet Take 1 tablet (81 mg total) by mouth daily. Swallow whole. 90 tablet 6    calcium carbonate (TUMS - DOSED IN MG ELEMENTAL CALCIUM) 500 MG chewable tablet Chew 1 tablet by mouth 2 (two) times daily as needed for indigestion or heartburn.      clindamycin (CLEOCIN) 300 MG capsule Take 2 capsules (600 mg total) by mouth 3 (three) times daily. X 7 days 540 capsule 1    ferrous sulfate 325 (65 FE) MG tablet Take 1 tablet (325 mg total) by mouth every other day. 45 tablet 2    Prenat w/o A-FeCbGl-DSS-FA-DHA (CITRANATAL ASSURE) 35-1 & 300 MG tablet Take 1 tablet  by mouth daily.      silver sulfADIAZINE (SILVADENE) 1 % cream Apply 1 application topically 2 (two) times daily. 50 g 11    Blood Pressure Monitor MISC For regular home bp monitoring during pregnancy (Patient not taking: No sig reported) 1 each 0      Review of Systems   All systems  reviewed and negative except as stated in HPI  Blood pressure (!) 141/79, pulse (!) 102, temperature 98.3 F (36.8 C), temperature source Oral, resp. rate 18, height 5' (1.524 m), weight 119.3 kg, last menstrual period 10/10/2020, not currently breastfeeding. General appearance: alert Lungs: clear to auscultation bilaterally Heart: regular rate and rhythm Abdomen: soft, non-tender; bowel sounds normal Extremities: Homans sign is negative, no sign of DVT       Prenatal labs: ABO, Rh: --/--/O POS (08/05 1008) Antibody: NEG (08/05 1008) Rubella: 4.09 (02/01 1609) RPR: NON REACTIVE (08/05 1000)  HBsAg: Negative (02/01 1609)  HIV: Non Reactive (05/18 0859)  GBS:   Positive (urine) 2 hr Glucola: 82 fasting (normal)/ 119 at 1 hr (normal)/ 60 at 2 hr (elevated)   Prenatal Transfer Tool  Maternal Diabetes: No Genetic Screening: Abnormal:  Results: Other: NIPS with atypical sex chromosome, declined amnio Maternal Ultrasounds/Referrals: Normal Fetal Ultrasounds or other Referrals:  None Maternal Substance Abuse:  No Significant Maternal Medications:  None Significant Maternal Lab Results: Group B Strep positive  No results found for this or any previous visit (from the past 24 hour(s)).  Patient Active Problem List   Diagnosis Date Noted   History of cesarean section 07/12/2021   Request for sterilization 03/17/2021   Abnormal chromosomal and genetic finding on antenatal screening mother 01/18/2021   GBS bacteriuria 01/07/2021   Encounter for supervision of normal pregnancy, antepartum 01/05/2021   Cyst of eye 01/05/2021   Marijuana use 05/02/2019   S/P cesarean section 09/01/2015   Asthma  04/29/2015   Smoker 04/29/2015   Hidradenitis suppurativa    Hard of hearing     Assessment/Plan:  Norell Brisbin is a 25 y.o. G3P2002 at [redacted]w[redacted]d here for scheduled rLTCS and PP IUD placement Scheduled rLTCS  The risks of cesarean section were discussed with the patient including but were not limited to: bleeding which may require transfusion or reoperation; infection which may require antibiotics; injury to bowel, bladder, ureters or other surrounding organs; injury to the fetus; need for additional procedures including hysterectomy in the event of a life-threatening hemorrhage; placental abnormalities wth subsequent pregnancies, incisional problems, thromboembolic phenomenon and other postoperative/anesthesia complications.The patient concurred with the proposed plan, giving informed written consent for the procedures.  Patient has been NPO since 11 PM  she will remain NPO for procedure. Anesthesia and OR aware.  Preoperative prophylactic antibiotics and SCDs ordered on call to the OR.  To OR when ready.  #Pain: spinal #ID:  GBS positive #MOF: breast #MOC: PP IUD  #Circ:  N/A female fetus  2. GBS bacturia Dx 01/07/21, hx of PCN allergy (reaction hives) - has previously tolerated cephalosporins. Plan to treat with pre-op antibiotics   Renard Matter, MD, MPH OB Fellow, Faculty [facility]

## 2021-07-12 NOTE — Progress Notes (Signed)
Called to room by nursing staff due to patient wanting to leave the hospital to go smoke and then come back. Discussed with patient that due to her IV, foley and recent abdominal surgery it was a severe risk to leave the hospital to go smoke even if it was in her car. Discussed that we could provide her a nicotine patch until she was able to get her foley out and her lines but it was an infection risk to leave prior to that. Patient states that she will try the nicotine patch.  Nelson Chimes MD

## 2021-07-12 NOTE — Anesthesia Procedure Notes (Signed)
Spinal  Patient location during procedure: OR Start time: 07/12/2021 12:30 PM End time: 07/12/2021 12:38 PM Reason for block: surgical anesthesia Staffing Anesthesiologist: Mal Amabile, MD Preanesthetic Checklist Completed: patient identified, IV checked, site marked, risks and benefits discussed, surgical consent, monitors and equipment checked, pre-op evaluation and timeout performed Spinal Block Patient position: sitting Prep: DuraPrep Patient monitoring: cardiac monitor, continuous pulse ox, blood pressure and heart rate Approach: midline Location: L3-4 Injection technique: catheter Needle Needle type: Tuohy and Spinocan  Needle gauge: 24 G Needle length: 12.7 cm Needle insertion depth: 8 cm Catheter type: closed end flexible Catheter size: 19 g Assessment Events: CSF return Additional Notes Epidural performed using LOR with air technique. No CSF, Heme or paresthesias. SAB performed through the epidural needle using 24ga Spinocan needle. CSF clear with free flow and no paresthesias. Local anesthetic and narcotics injected through the spinal needle and withdrawn. Epidural catheter threaded 5cm into the epidural space and the epidural needle was withdrawn. A sterile dressing was applied and the patient placed supine with LUD. The patient tolerated the procedure well and adequate sensory level was obtained.

## 2021-07-12 NOTE — Transfer of Care (Signed)
Immediate Anesthesia Transfer of Care Note  Patient: Education officer, environmental  Procedure(s) Performed: CESAREAN SECTION (Abdomen) INTRAUTERINE DEVICE (IUD) INSERTION (Uterus)  Patient Location: PACU  Anesthesia Type:Spinal and Epidural  Level of Consciousness: drowsy  Airway & Oxygen Therapy: Patient Spontanous Breathing  Post-op Assessment: Report given to RN and Post -op Vital signs reviewed and stable  Post vital signs: Reviewed and stable  Last Vitals:  Vitals Value Taken Time  BP 115/62 07/12/21 1500  Temp    Pulse 84 07/12/21 1505  Resp 16 07/12/21 1505  SpO2 96 % 07/12/21 1505  Vitals shown include unvalidated device data.  Last Pain:  Vitals:   07/12/21 1056  TempSrc:   PainSc: 0-No pain         Complications: No notable events documented.

## 2021-07-12 NOTE — Addendum Note (Signed)
Addendum  created 07/12/21 1605 by Mal Amabile, MD   Flowsheet accepted, LDA properties accepted

## 2021-07-12 NOTE — Anesthesia Preprocedure Evaluation (Addendum)
Anesthesia Evaluation  Patient identified by MRN, date of birth, ID band Patient awake    Reviewed: Allergy & Precautions, NPO status , Patient's Chart, lab work & pertinent test results  Airway Mallampati: III  TM Distance: >3 FB Neck ROM: Full    Dental no notable dental hx. (+) Teeth Intact, Dental Advisory Given   Pulmonary asthma , Current Smoker and Patient abstained from smoking.,    Pulmonary exam normal breath sounds clear to auscultation       Cardiovascular negative cardio ROS Normal cardiovascular exam Rhythm:Regular Rate:Normal     Neuro/Psych negative neurological ROS  negative psych ROS   GI/Hepatic Neg liver ROS, GERD  Medicated,  Endo/Other  Morbid obesity  Renal/GU negative Renal ROS  negative genitourinary   Musculoskeletal Hidradenitis   Abdominal (+) + obese,   Peds  Hematology  (+) anemia ,   Anesthesia Other Findings   Reproductive/Obstetrics (+) Pregnancy Previous C/Section x 2 Desires permanent sterilization                            Anesthesia Physical Anesthesia Plan  ASA: 3  Anesthesia Plan: Combined Spinal and Epidural and Spinal   Post-op Pain Management:    Induction:   PONV Risk Score and Plan: 3 and Treatment may vary due to age or medical condition, Scopolamine patch - Pre-op and Ondansetron  Airway Management Planned: Natural Airway  Additional Equipment:   Intra-op Plan:   Post-operative Plan:   Informed Consent: I have reviewed the patients History and Physical, chart, labs and discussed the procedure including the risks, benefits and alternatives for the proposed anesthesia with the patient or authorized representative who has indicated his/her understanding and acceptance.     Dental advisory given  Plan Discussed with: CRNA and Anesthesiologist  Anesthesia Plan Comments:        Anesthesia Quick Evaluation

## 2021-07-12 NOTE — Discharge Summary (Signed)
Postpartum Discharge Summary  Date of Service updated8/10/22     Patient Name: Loretta Moore DOB: Nov 19, 1996 MRN: [id number]  Date of admission: 07/12/2021 Delivery date:07/12/2021  Delivering provider: Clarnce Flock  Date of discharge: 07/14/2021  Admitting diagnosis: History of cesarean section [Z98.891] Intrauterine pregnancy: [redacted]w[redacted]d     Secondary diagnosis:  Active Problems:   Asthma   S/P cesarean section   Encounter for supervision of normal pregnancy, antepartum   GBS bacteriuria   Abnormal chromosomal and genetic finding on antenatal screening mother   Request for sterilization   History of cesarean section  Additional problems: None    Discharge diagnosis: Term Pregnancy Delivered                                              Post partum procedures: ppIUD Complications: None  Hospital course: Sceduled C/S   25 y.o. yo G3P3003 at [redacted]w[redacted]d was admitted to the hospital 07/12/2021 for scheduled cesarean section with the following indication:Elective Repeat.Delivery details are as follows:  Membrane Rupture Time/Date: 1:17 PM ,07/12/2021   Delivery Method:C-Section, Vacuum Assisted  Details of operation can be found in separate operative note.  Patient had an uncomplicated postpartum course.  She is ambulating, tolerating a regular diet, passing flatus, and urinating well. 1 mildly elevated bp last night, completely normal this am. No h/o HTN. Denies ha, visual changes, ruq/epigastric pain, n/v.  Will repeat at incision check in 1wk. Reviewed pre-e s/s, reasons to seek care.Patient is discharged home in stable condition on  07/14/21        Newborn Data: Birth date:07/12/2021  Birth time:1:19 PM  Gender:Female  Living status:Living  Apgars:8 ,7  Weight:3340 g     Magnesium Sulfate received: No BMZ received: No Rhophylac:No MMR:N/A T-DaP:Given prenatally Flu: N/A Transfusion:No  Physical exam  Vitals:   07/13/21 0858 07/13/21 1350 07/13/21 2054 07/14/21 0635  BP: (!)  110/52 135/71 (!) 132/91 120/75  Pulse: 69 86 89 80  Resp: $Remo'18 17 18 18  'IDjLy$ Temp: 98.2 F (36.8 C) 99 F (37.2 C) 98.7 F (37.1 C) 98.1 F (36.7 C)  TempSrc: Oral Oral Oral Oral  SpO2: 100% 96% 100% 100%  Weight:      Height:       General: alert, cooperative, and no distress Lochia: appropriate Uterine Fundus: firm Incision: Healing well with no significant drainage, No significant erythema, Dressing is clean, dry, and intact, prevena dressing intact DVT Evaluation: No evidence of DVT seen on physical exam. Negative Homan's sign. No cords or calf tenderness. No significant calf/ankle edema. Labs: Lab Results  Component Value Date   WBC 10.5 07/13/2021   HGB 9.5 (L) 07/13/2021   HCT 28.9 (L) 07/13/2021   MCV 89.8 07/13/2021   PLT 285 07/13/2021   CMP Latest Ref Rng & Units 07/09/2021  Glucose 70 - 99 mg/dL 67(L)  BUN 6 - 20 mg/dL 5(L)  Creatinine 0.44 - 1.00 mg/dL 0.49  Sodium 135 - 145 mmol/L 134(L)  Potassium 3.5 - 5.1 mmol/L 3.7  Chloride 98 - 111 mmol/L 102  CO2 22 - 32 mmol/L 21(L)  Calcium 8.9 - 10.3 mg/dL 9.0  Total Protein 6.5 - 8.1 g/dL 7.0  Total Bilirubin 0.3 - 1.2 mg/dL 0.6  Alkaline Phos 38 - 126 U/L 131(H)  AST 15 - 41 U/L 21  ALT 0 - 44 U/L 14  Edinburgh Score: Edinburgh Postnatal Depression Scale Screening Tool 07/13/2021  I have been able to laugh and see the funny side of things. 0  I have looked forward with enjoyment to things. 0  I have blamed myself unnecessarily when things went wrong. 0  I have been anxious or worried for no good reason. 0  I have felt scared or panicky for no good reason. 0  Things have been getting on top of me. 0  I have been so unhappy that I have had difficulty sleeping. 0  I have felt sad or miserable. 0  I have been so unhappy that I have been crying. 0  The thought of harming myself has occurred to me. 0  Edinburgh Postnatal Depression Scale Total 0     After visit meds:  Allergies as of 07/14/2021        Reactions   Bee Venom Anaphylaxis   Amoxicillin Hives   Augmentin [amoxicillin-pot Clavulanate] Hives   Ceclor [cefaclor] Hives   Penicillins Hives   Has patient had a PCN reaction causing immediate rash, facial/tongue/throat swelling, SOB or lightheadedness with hypotension: Yes Has patient had a PCN reaction causing severe rash involving mucus membranes or skin necrosis: No Has patient had a PCN reaction that required hospitalization No Has patient had a PCN reaction occurring within the last 10 years: No If all of the above answers are "NO", then may proceed with Cephalosporin use.   Sulfa Antibiotics Hives   Tomato Hives   Other Rash   Paper tape causes rash   Oxycodone Itching        Medication List     STOP taking these medications    albuterol (2.5 MG/3ML) 0.083% nebulizer solution Commonly known as: PROVENTIL   albuterol 108 (90 Base) MCG/ACT inhaler Commonly known as: VENTOLIN HFA   aspirin 81 MG EC tablet   Blood Pressure Monitor Misc   calcium carbonate 500 MG chewable tablet Commonly known as: TUMS - dosed in mg elemental calcium   clindamycin 300 MG capsule Commonly known as: CLEOCIN   silver sulfADIAZINE 1 % cream Commonly known as: Silvadene       TAKE these medications    CitraNatal Assure 35-1 & 300 MG tablet Take 1 tablet by mouth daily.   ferrous sulfate 325 (65 FE) MG tablet Take 1 tablet (325 mg total) by mouth every other day.   HYDROcodone-acetaminophen 5-325 MG tablet Commonly known as: NORCO/VICODIN Take 1-2 tablets by mouth every 4 (four) hours as needed for moderate pain.   ibuprofen 600 MG tablet Commonly known as: ADVIL Take 1 tablet (600 mg total) by mouth every 6 (six) hours as needed for mild pain, moderate pain or cramping.         Discharge home in stable condition Infant Feeding: Breast Infant Disposition:home with mother Discharge instruction: per After Visit Summary and Postpartum booklet. Activity: Advance  as tolerated. Pelvic rest for 6 weeks.  Diet: routine diet Future Appointments: Future Appointments  Date Time Provider [place]  07/20/2021  3:50 PM Janyth Pupa, DO [facility]  08/18/2021 11:30 AM Roma Schanz, CNM [facility]   Follow up Visit: Message sent to [facility] on 8/8 by Dr. Cy Blamer  Please schedule this patient for a In person postpartum visit in 4 weeks with the following provider: Any provider. Additional Postpartum F/U:Incision check 1 week  Low risk pregnancy complicated by:  None Delivery mode:  C-Section, Vacuum Assisted  Anticipated Birth Control:  PP IUD placed  07/14/2021 Roma Schanz, CNM

## 2021-07-13 ENCOUNTER — Encounter (HOSPITAL_COMMUNITY): Payer: Self-pay | Admitting: Family Medicine

## 2021-07-13 LAB — CBC
HCT: 28.9 % — ABNORMAL LOW (ref 36.0–46.0)
Hemoglobin: 9.5 g/dL — ABNORMAL LOW (ref 12.0–15.0)
MCH: 29.5 pg (ref 26.0–34.0)
MCHC: 32.9 g/dL (ref 30.0–36.0)
MCV: 89.8 fL (ref 80.0–100.0)
Platelets: 285 10*3/uL (ref 150–400)
RBC: 3.22 MIL/uL — ABNORMAL LOW (ref 3.87–5.11)
RDW: 15.6 % — ABNORMAL HIGH (ref 11.5–15.5)
WBC: 10.5 10*3/uL (ref 4.0–10.5)
nRBC: 0 % (ref 0.0–0.2)

## 2021-07-13 LAB — BIRTH TISSUE RECOVERY COLLECTION (PLACENTA DONATION)

## 2021-07-13 MED ORDER — FERROUS SULFATE 325 (65 FE) MG PO TABS
325.0000 mg | ORAL_TABLET | ORAL | Status: DC
  Administered 2021-07-13: 325 mg via ORAL
  Filled 2021-07-13: qty 1

## 2021-07-13 NOTE — Plan of Care (Signed)
  Problem: Education: Goal: Knowledge of General Education information will improve Description: Including pain rating scale, medication(s)/side effects and non-pharmacologic comfort measures Outcome: Completed/Met   Problem: Clinical Measurements: Goal: Ability to maintain clinical measurements within normal limits will improve Outcome: Completed/Met Goal: Will remain free from infection Outcome: Completed/Met   

## 2021-07-13 NOTE — Lactation Note (Signed)
This note was copied from a baby's chart. Lactation Consultation Note  Patient Name: Loretta Moore WNUUV'O Date: 07/13/2021 Reason for consult: Follow-up assessment;Term Age:27 hours   P3 mother whose infant is now 69 hours old.  This is a term baby at 39+2 weeks.  Mother breast fed her first child (now 63 years old) for 3 months and her second child (now 12 1/2 years old) for 6-7 months.  RN in room when I arrived and mother getting ready to latch.  Offered to assist and mother agreeable.  Mother demonstrated hand expression; no colostrum drops obtained at this time.  Assisted to latch in the football hold to the right breast.  Observed baby feeding with constant stimulation for 9 minutes prior to leaving the room.  Mother denied pain with feeding.    Mother will continue to feed on cue or at least 8-12 times/24 hours.  Encouraged to call for latch assistance as needed.  Mother has a DEBP for home use.  No support person present at this time.   Maternal Data Has patient been taught Hand Expression?: Yes Does the patient have breastfeeding experience prior to this delivery?: Yes How long did the patient breastfeed?: 3 months with first child and 6-7 months with the second child  Feeding Mother's Current Feeding Choice: Breast Milk  LATCH Score Latch: Repeated attempts needed to sustain latch, nipple held in mouth throughout feeding, stimulation needed to elicit sucking reflex.  Audible Swallowing: None  Type of Nipple: Everted at rest and after stimulation  Comfort (Breast/Nipple): Soft / non-tender  Hold (Positioning): Assistance needed to correctly position infant at breast and maintain latch.  LATCH Score: 6   Lactation Tools Discussed/Used Tools: Pump Breast pump type: Manual (Per mother's request) Pump Education: Setup, frequency, and cleaning Reason for Pumping: Mother's desire Pumping frequency: Prn  Interventions Interventions: Breast feeding basics  reviewed;Assisted with latch;Skin to skin;Breast massage;Hand express;Breast compression;Adjust position;Hand pump;Position options;Support pillows;Education  Discharge Pump: Manual;Personal  Consult Status Consult Status: Follow-up Date: 07/14/21 Follow-up type: In-patient    Dora Sims 07/13/2021, 4:33 AM

## 2021-07-13 NOTE — Progress Notes (Addendum)
Post Operative Day 1 Subjective: Loretta Moore reports feeling well with no pain this morning. She has been ambulating independently. She has been voiding urine. She has not yet passed flatus or had a bowel movement. She has been tolerating PO. Her lochia is minimal.   Objective: Blood pressure (!) 125/59, pulse 75, temperature 98 F (36.7 C), temperature source Oral, resp. rate 18, height 5' (1.524 m), weight 119.3 kg, last menstrual period 10/10/2020, SpO2 98 %, unknown if currently breastfeeding.  Physical Exam:  General: alert and no distress Lochia: appropriate Uterine Fundus: firm Incision: Vacuum dressing on wound, intact, clean, and dry  DVT Evaluation: No cords or calf tenderness. No significant calf/ankle edema.  Recent Labs    07/13/21 0624  HGB 9.5*  HCT 28.9*    Assessment/Plan: POD #1  - VSS  - Meeting all PP milestones  - Breastfeeding  - PP Liletta in place  Anemia  - Hgb drop from 11.7 to 9.5  - Asymptomatic, start PO iron supplement   Disposition: Patient will likely be discharged home tomorrow.    LOS: 1 day   Loretta Moore 07/13/2021, 8:31 AM   I personally saw and evaluated the patient, performing the key elements of the service. I developed and verified the management plan that is described in the resident's/student's note, and I agree with the content with my edits above. VSS, HRR&R, Resp unlabored, Legs neg.  Nigel Berthold, CNM 07/22/2021 7:20 PM

## 2021-07-14 ENCOUNTER — Other Ambulatory Visit: Payer: Self-pay | Admitting: Women's Health

## 2021-07-14 MED ORDER — PNEUMOCOCCAL VAC POLYVALENT 25 MCG/0.5ML IJ INJ: 0.5000 mL | INJECTION | INTRAMUSCULAR | Status: DC

## 2021-07-14 MED ORDER — IBUPROFEN 600 MG PO TABS: 600.0000 mg | ORAL_TABLET | Freq: Four times a day (QID) | ORAL | 0 refills | Status: DC | PRN

## 2021-07-14 MED ORDER — PNEUMOCOCCAL VAC POLYVALENT 25 MCG/0.5ML IJ INJ
0.5000 mL | INJECTION | INTRAMUSCULAR | Status: DC | PRN
  Filled 2021-07-14: qty 0.5

## 2021-07-14 MED ORDER — HYDROCODONE-ACETAMINOPHEN 5-325 MG PO TABS: 1.0000 | ORAL_TABLET | ORAL | 0 refills | Status: DC | PRN

## 2021-07-14 NOTE — Lactation Note (Signed)
This note was copied from a baby's chart. Lactation Consultation Note  Patient Name: Loretta Moore [id number] Date: 07/14/2021 Reason for consult: Follow-up assessment;Term;Infant weight loss;Other (Comment) (8 % weight loss /) Age:27 hours Bili check at 40 hours - 5.5. Voids and stools correlate with weight loss.  As LC entered the room, mom changing the diaper ( wet and stool ) Mom latched the baby in cross cradle - LC observed and showed mom how to flip  Upper lip to increase depth / increased swallows and per mom comfortable.  Baby still feeding at 16 mins. ( RN aware to document total time)  LC reviewed BF D/C teaching- see below.  Per mom feels her milk is coming in/ more swallows/ and feel heavier .  Mom has the [facility] brochure with resource numbers and BFSG.    Maternal Data    Feeding Mother's Current Feeding Choice: Breast Milk  LATCH Score Latch: Grasps breast easily, tongue down, lips flanged, rhythmical sucking.  Audible Swallowing: Spontaneous and intermittent  Type of Nipple: Everted at rest and after stimulation  Comfort (Breast/Nipple): Filling, red/small blisters or bruises, mild/mod discomfort  Hold (Positioning): Assistance needed to correctly position infant at breast and maintain latch. (LC assisted to flip upper lip to increase flange)  LATCH Score: 8   Lactation Tools Discussed/Used Tools: Pump;Flanges Flange Size: 24 Breast pump type: Manual Pump Education: Milk Storage  Interventions Interventions: Breast feeding basics reviewed;Assisted with latch;Skin to skin;Breast massage;Hand express;Breast compression;Adjust position;Hand pump;Education  Discharge Discharge Education: Engorgement and breast care;Warning signs for feeding baby Pump: Personal;Manual;DEBP  Consult Status Consult Status: Complete Date: 07/14/21    Loretta Moore 07/14/2021, 8:32 AM

## 2021-07-15 ENCOUNTER — Telehealth: Payer: Self-pay

## 2021-07-15 NOTE — Telephone Encounter (Signed)
Transition Care Management Unsuccessful Follow-up Telephone Call  Date of discharge and from where:  07/14/2021-[facility]  Attempts:  1st Attempt  Reason for unsuccessful TCM follow-up call:  Left voice message

## 2021-07-16 ENCOUNTER — Telehealth: Payer: Self-pay

## 2021-07-16 NOTE — Telephone Encounter (Signed)
Pt called stating that her wound vac was alarming with a code of "blockage or full". Pt stated that the chamber was slightly over half full. Pt was sent home with a charger, instructions, and some additional clear sticky tape in case of loss of suction due to opening at skin. Since chamber was not full, attempt was made by the pt to milk the tubing near the chamber with clamp engaged d/t some blood stuck in tubing. Pt did this and also turned the wound vac off and on after unclamping the tubing. Pt stated that it turned back on, suction engaged, and no more alarming. Pt was instructed to ensure no leaking at incision site, milk tubing if blockage occurs again, and seek care at hospital if canister is full or stops working. Pt confirmed understanding.

## 2021-07-16 NOTE — Telephone Encounter (Signed)
Transition Care Management Unsuccessful Follow-up Telephone Call  Date of discharge and from where:  07/14/2021-[facility]   Attempts:  2nd Attempt  Reason for unsuccessful TCM follow-up call:  Unable to reach patient

## 2021-07-17 ENCOUNTER — Inpatient Hospital Stay (HOSPITAL_COMMUNITY)
Admission: AD | Admit: 2021-07-17 | Discharge: 2021-07-17 | Disposition: A | Discharge status: Home / Self Care | Payer: Medicaid Other | Attending: Family Medicine | Admitting: Family Medicine

## 2021-07-17 ENCOUNTER — Other Ambulatory Visit: Payer: Self-pay

## 2021-07-17 DIAGNOSIS — G8918 Other acute postprocedural pain: Secondary | ICD-10-CM | POA: Insufficient documentation

## 2021-07-17 DIAGNOSIS — Z885 Allergy status to narcotic agent status: Secondary | ICD-10-CM | POA: Diagnosis not present

## 2021-07-17 DIAGNOSIS — F17210 Nicotine dependence, cigarettes, uncomplicated: Secondary | ICD-10-CM | POA: Insufficient documentation

## 2021-07-17 DIAGNOSIS — O99335 Smoking (tobacco) complicating the puerperium: Secondary | ICD-10-CM | POA: Insufficient documentation

## 2021-07-17 DIAGNOSIS — O9089 Other complications of the puerperium, not elsewhere classified: Secondary | ICD-10-CM | POA: Insufficient documentation

## 2021-07-17 DIAGNOSIS — Z98891 History of uterine scar from previous surgery: Secondary | ICD-10-CM | POA: Insufficient documentation

## 2021-07-17 DIAGNOSIS — Z880 Allergy status to penicillin: Secondary | ICD-10-CM | POA: Diagnosis not present

## 2021-07-17 DIAGNOSIS — Z882 Allergy status to sulfonamides status: Secondary | ICD-10-CM | POA: Diagnosis not present

## 2021-07-17 DIAGNOSIS — Z881 Allergy status to other antibiotic agents status: Secondary | ICD-10-CM | POA: Diagnosis not present

## 2021-07-17 NOTE — MAU Provider Note (Signed)
[facility] Attending MAU Note  Chief Complaint: Wound Vac Not Working and Abdominal Pain    Event Date/Time   First Provider Initiated Contact with Patient 07/17/21 1543      SUBJECTIVE Loretta Moore is a 25 y.o. G3P3003 at Unknown by LMP who presents with 5 days s/p RLTCS with Prevena wound vac in place. Last pm, she stepped on the cord and pulled it off. It has been beeping since. She also has incisional pain on the right.  Past Medical History:  Diagnosis Date   Anemia    Asthma    Hard of hearing    Hidradenitis suppurativa    had excisions in axilla and groin   OB History  Gravida Para Term Preterm AB Living  3 3 3     3   SAB IAB Ectopic Multiple Live Births        0 3    # Outcome Date GA Lbr Len/2nd Weight Sex Delivery Anes PTL Lv  3 Term 07/12/21 [redacted]w[redacted]d  3340 g F CS-Vac Spinal  LIV  2 Term 10/26/19 [redacted]w[redacted]d  3090 g M CS-Vac Spinal  LIV  1 Term 08/29/15 [redacted]w[redacted]d  3351 g M CS-LTranv EPI N LIV     Birth Comments: Hgb, Normal, FA Newborn Screen Barcode: [redacted]w[redacted]d Date Collected: 08/30/2015     Complications: Fetal Intolerance   Past Surgical History:  Procedure Laterality Date   AXILLARY HIDRADENITIS EXCISION     CESAREAN SECTION N/A 08/29/2015   Procedure: CESAREAN SECTION;  Surgeon: 08/31/2015, DO;  Location: [facility];  Service: Obstetrics;  Laterality: N/A;   CESAREAN SECTION N/A 10/26/2019   Procedure: REPEAT CESAREAN SECTION;  Surgeon: 10/28/2019, MD;  Location: [facility];  Service: Obstetrics;  Laterality: N/A;   CESAREAN SECTION N/A 07/12/2021   Procedure: CESAREAN SECTION;  Surgeon: 09/11/2021, MD;  Location: [facility];  Service: Obstetrics;  Laterality: N/A;   HYDRADENITIS EXCISION Bilateral 01/29/2019   Procedure: Excision Hidradentitis groin (right) and right and left flank;  Surgeon: 01/31/2019, MD;  Location: [facility];  Service: General;  Laterality: Bilateral;   INGUINAL HIDRADENITIS EXCISION     INTRAUTERINE  DEVICE (IUD) INSERTION N/A 07/12/2021   Procedure: INTRAUTERINE DEVICE (IUD) INSERTION;  Surgeon: 09/11/2021, MD;  Location: [facility];  Service: Obstetrics;  Laterality: N/A;   TONSILLECTOMY AND ADENOIDECTOMY     Social History   Socioeconomic History   Marital status: Single    Spouse name: Not on file   Number of children: 1   Years of education: Not on file   Highest education level: Some college, no degree  Occupational History   Not on file  Tobacco Use   Smoking status: Every Day    Packs/day: 0.50    Years: 3.00    Pack years: 1.50    Types: Cigarettes    Last attempt to quit: 06/02/2015    Years since quitting: 6.1   Smokeless tobacco: Never  Vaping Use   Vaping Use: Never used  Substance and Sexual Activity   Alcohol use: Not Currently    Alcohol/week: 0.0 standard drinks    Comment: on weekends   Drug use: No   Sexual activity: Yes    Birth control/protection: None  Other Topics Concern   Not on file  Social History Narrative   Not on file   Social Determinants of Health   Financial Resource Strain: Low Risk    Difficulty  of Paying Living Expenses: Not very hard  Food Insecurity: Food Insecurity Present   Worried About Running Out of Food in the Last Year: Sometimes true   Ran Out of Food in the Last Year: Sometimes true  Transportation Needs: Unmet Transportation Needs   Lack of Transportation (Medical): Yes   Lack of Transportation (Non-Medical): No  Physical Activity: Insufficiently Active   Days of Exercise per Week: 2 days   Minutes of Exercise per Session: 60 min  Stress: No Stress Concern Present   Feeling of Stress : Not at all  Social Connections: Socially Integrated   Frequency of Communication with Friends and Family: More than three times a week   Frequency of Social Gatherings with Friends and Family: Once a week   Attends Religious Services: 1 to 4 times per year   Active Member of [employer] or Organizations: No   Attends Museum/gallery exhibitions officer: 1 to 4 times per year   Marital Status: Married  Catering manager Violence: Not At Risk   Fear of Current or Ex-Partner: No   Emotionally Abused: No   Physically Abused: No   Sexually Abused: No   No current facility-administered medications on file prior to encounter.   Current Outpatient Medications on File Prior to Encounter  Medication Sig Dispense Refill   ferrous sulfate 325 (65 FE) MG tablet Take 1 tablet (325 mg total) by mouth every other day. 45 tablet 2   HYDROcodone-acetaminophen (NORCO/VICODIN) 5-325 MG tablet Take 1-2 tablets by mouth every 4 (four) hours as needed for moderate pain. 30 tablet 0   ibuprofen (ADVIL) 600 MG tablet Take 1 tablet (600 mg total) by mouth every 6 (six) hours as needed for mild pain, moderate pain or cramping. 30 tablet 0   Prenat w/o A-[id number] (CITRANATAL ASSURE) 35-1 & 300 MG tablet Take 1 tablet by mouth daily.     Allergies  Allergen Reactions   Bee Venom Anaphylaxis   Amoxicillin Hives   Augmentin [Amoxicillin-Pot Clavulanate] Hives   Ceclor [Cefaclor] Hives   Penicillins Hives    Has patient had a PCN reaction causing immediate rash, facial/tongue/throat swelling, SOB or lightheadedness with hypotension: Yes Has patient had a PCN reaction causing severe rash involving mucus membranes or skin necrosis: No Has patient had a PCN reaction that required hospitalization No Has patient had a PCN reaction occurring within the last 10 years: No If all of the above answers are "NO", then may proceed with Cephalosporin use.    Sulfa Antibiotics Hives   Tomato Hives   Other Rash    Paper tape causes rash   Oxycodone Itching    ROS: Pertinent items in HPI  OBJECTIVE BP 137/76 (BP Location: Right Arm)   Pulse (!) 105   Temp 98.3 F (36.8 C) (Oral)   Resp 20   Ht 5' (1.524 m)   Wt 117.1 kg   SpO2 98%   BMI 50.41 kg/m  CONSTITUTIONAL: Well-developed, well-nourished female in no acute distress.  HENT:   Normocephalic, atraumatic, External right and left ear normal. Oropharynx is clear and moist EYES: Conjunctivae and EOM are normal.  No scleral icterus.  NECK: Normal range of motion, supple, no masses.  Normal thyroid.  SKIN: Skin is warm and dry. No rash noted. Not diaphoretic. No erythema. No pallor. NEUROLGIC: Alert and oriented to person, place, and time. Normal reflexes, muscle tone coordination. No cranial nerve deficit noted. PSYCHIATRIC: Normal mood and affect. Normal behavior. Normal judgment and thought content. CARDIOVASCULAR:  Normal heart rate noted RESPIRATORY: Effort and breath sounds normal, no problems with respiration noted. ABDOMEN: Soft, normal bowel sounds, no distention noted.  No tenderness, rebound or guarding. Prevena in place MUSCULOSKELETAL: Normal range of motion. No tenderness.  No cyanosis, clubbing, or edema.  2+ distal pulses.  LAB RESULTS No results found for this or any previous visit (from the past 48 hour(s)).  IMAGING No results found.  MAU COURSE Prevena pump replaced and site re-inforced with new tegaderm.  ASSESSMENT 1. History of cesarean section   2. S/P cesarean section     PLAN Discharge home  Follow-up Information     [facility] Follow up.   Specialty: Obstetrics and Gynecology Contact information: [address] [place] [address] [phone]               Allergies as of 07/17/2021       Reactions   Bee Venom Anaphylaxis   Amoxicillin Hives   Augmentin [amoxicillin-pot Clavulanate] Hives   Ceclor [cefaclor] Hives   Penicillins Hives   Has patient had a PCN reaction causing immediate rash, facial/tongue/throat swelling, SOB or lightheadedness with hypotension: Yes Has patient had a PCN reaction causing severe rash involving mucus membranes or skin necrosis: No Has patient had a PCN reaction that required hospitalization No Has patient had a PCN reaction occurring within the last 10  years: No If all of the above answers are "NO", then may proceed with Cephalosporin use.   Sulfa Antibiotics Hives   Tomato Hives   Other Rash   Paper tape causes rash   Oxycodone Itching        Medication List     TAKE these medications    CitraNatal Assure 35-1 & 300 MG tablet Take 1 tablet by mouth daily.   ferrous sulfate 325 (65 FE) MG tablet Take 1 tablet (325 mg total) by mouth every other day.   HYDROcodone-acetaminophen 5-325 MG tablet Commonly known as: NORCO/VICODIN Take 1-2 tablets by mouth every 4 (four) hours as needed for moderate pain.   ibuprofen 600 MG tablet Commonly known as: ADVIL Take 1 tablet (600 mg total) by mouth every 6 (six) hours as needed for mild pain, moderate pain or cramping.        Evaluation does not show pathology that would require ongoing emergent intervention or inpatient treatment. Patient is hemodynamically stable and mentating appropriately. Discussed findings and plan with patient, who agrees with care plan. All questions answered. Return precautions discussed and outpatient follow up recommendations given.  Reva Bores, MD 07/17/2021 4:24 PM

## 2021-07-17 NOTE — MAU Note (Signed)
Presents stating wound vac not working.  States supposed to use x7 days, has only had for 2 days.  Also reports abdominal pain, lower abdomen burning & upper abdomen aching. S/P Cesarean 07/12/2021

## 2021-07-18 ENCOUNTER — Inpatient Hospital Stay (HOSPITAL_COMMUNITY)
Admission: AD | Admit: 2021-07-18 | Discharge: 2021-07-18 | Disposition: A | Discharge status: Home / Self Care | Payer: Medicaid Other | Attending: Family Medicine | Admitting: Family Medicine

## 2021-07-18 ENCOUNTER — Other Ambulatory Visit: Payer: Self-pay

## 2021-07-18 ENCOUNTER — Encounter (HOSPITAL_COMMUNITY): Payer: Self-pay | Admitting: Family Medicine

## 2021-07-18 DIAGNOSIS — F17210 Nicotine dependence, cigarettes, uncomplicated: Secondary | ICD-10-CM | POA: Insufficient documentation

## 2021-07-18 DIAGNOSIS — R1900 Intra-abdominal and pelvic swelling, mass and lump, unspecified site: Secondary | ICD-10-CM | POA: Insufficient documentation

## 2021-07-18 DIAGNOSIS — O8600 Infection of obstetric surgical wound, unspecified: Secondary | ICD-10-CM | POA: Diagnosis not present

## 2021-07-18 DIAGNOSIS — O99335 Smoking (tobacco) complicating the puerperium: Secondary | ICD-10-CM | POA: Diagnosis not present

## 2021-07-18 DIAGNOSIS — T148XXA Other injury of unspecified body region, initial encounter: Secondary | ICD-10-CM | POA: Diagnosis not present

## 2021-07-18 DIAGNOSIS — O165 Unspecified maternal hypertension, complicating the puerperium: Secondary | ICD-10-CM | POA: Diagnosis not present

## 2021-07-18 DIAGNOSIS — L089 Local infection of the skin and subcutaneous tissue, unspecified: Secondary | ICD-10-CM | POA: Diagnosis not present

## 2021-07-18 DIAGNOSIS — O99893 Other specified diseases and conditions complicating puerperium: Secondary | ICD-10-CM | POA: Diagnosis not present

## 2021-07-18 DIAGNOSIS — M7989 Other specified soft tissue disorders: Secondary | ICD-10-CM | POA: Diagnosis not present

## 2021-07-18 DIAGNOSIS — R519 Headache, unspecified: Secondary | ICD-10-CM | POA: Insufficient documentation

## 2021-07-18 LAB — CBC
HCT: 29.3 % — ABNORMAL LOW (ref 36.0–46.0)
Hemoglobin: 9.4 g/dL — ABNORMAL LOW (ref 12.0–15.0)
MCH: 28.9 pg (ref 26.0–34.0)
MCHC: 32.1 g/dL (ref 30.0–36.0)
MCV: 90.2 fL (ref 80.0–100.0)
Platelets: 357 10*3/uL (ref 150–400)
RBC: 3.25 MIL/uL — ABNORMAL LOW (ref 3.87–5.11)
RDW: 16 % — ABNORMAL HIGH (ref 11.5–15.5)
WBC: 10.6 10*3/uL — ABNORMAL HIGH (ref 4.0–10.5)
nRBC: 0 % (ref 0.0–0.2)

## 2021-07-18 LAB — COMPREHENSIVE METABOLIC PANEL
ALT: 13 U/L (ref 0–44)
AST: 18 U/L (ref 15–41)
Albumin: 2.5 g/dL — ABNORMAL LOW (ref 3.5–5.0)
Alkaline Phosphatase: 76 U/L (ref 38–126)
Anion gap: 8 (ref 5–15)
BUN: 5 mg/dL — ABNORMAL LOW (ref 6–20)
CO2: 24 mmol/L (ref 22–32)
Calcium: 8.5 mg/dL — ABNORMAL LOW (ref 8.9–10.3)
Chloride: 105 mmol/L (ref 98–111)
Creatinine, Ser: 0.63 mg/dL (ref 0.44–1.00)
GFR, Estimated: >60 mL/min (ref >60)
Glucose, Bld: 79 mg/dL (ref 70–99)
Potassium: 3.9 mmol/L (ref 3.5–5.1)
Sodium: 137 mmol/L (ref 135–145)
Total Bilirubin: 0.4 mg/dL (ref 0.3–1.2)
Total Protein: 6.1 g/dL — ABNORMAL LOW (ref 6.5–8.1)

## 2021-07-18 MED ORDER — NIFEDIPINE ER 30 MG PO TB24: 30.0000 mg | ORAL_TABLET | Freq: Every day | ORAL | 1 refills | Status: DC

## 2021-07-18 MED ORDER — NIFEDIPINE ER OSMOTIC RELEASE 30 MG PO TB24
30.0000 mg | ORAL_TABLET | Freq: Every day | ORAL | Status: DC
  Administered 2021-07-18: 30 mg via ORAL
  Filled 2021-07-18: qty 1

## 2021-07-18 MED ORDER — CEPHALEXIN 500 MG PO CAPS: 500.0000 mg | ORAL_CAPSULE | Freq: Two times a day (BID) | ORAL | 0 refills | Status: AC

## 2021-07-18 MED ORDER — IBUPROFEN 600 MG PO TABS
600.0000 mg | ORAL_TABLET | Freq: Once | ORAL | Status: AC
  Administered 2021-07-18: 600 mg via ORAL
  Filled 2021-07-18: qty 1

## 2021-07-18 NOTE — MAU Provider Note (Addendum)
History     CSN: [id number]  Arrival date and time: 07/18/21 1732   Event Date/Time   First Provider Initiated Contact with Patient 07/18/21 1806      Chief Complaint  Patient presents with   Abdominal Pain   HPI Loretta Moore is a 25 y.o. G3P3003 at 6 days postpartum who presents with wound issue.  Had a repeat c/section last Monday & was discharged with a  wound vac. Had wound vac replaced in MAU yesterday. Not long after leaving MAU yesterday, she felt like her wound vac lost suction again. Since then she has noticed a foul smelling discharge from her dressing. Also feels like her abdomen is swollen. No increase in abdominal pain. Denies fever.  Denies history of hypertension. Has been having some intermittent headaches but currently denies having one. Denies visual disturbance or epigastric pain. Has also noticed an increase in bilateral lower extremity swelling. Denies cough, chest pain, SOB, or calf pain.   OB History     Gravida  3   Para  3   Term  3   Preterm      AB      Living  3      SAB      IAB      Ectopic      Multiple  0   Live Births  3           Past Medical History:  Diagnosis Date   Anemia    Asthma    Hard of hearing    Hidradenitis suppurativa    had excisions in axilla and groin    Past Surgical History:  Procedure Laterality Date   AXILLARY HIDRADENITIS EXCISION     CESAREAN SECTION N/A 08/29/2015   Procedure: CESAREAN SECTION;  Surgeon: Levie Heritage, DO;  Location: [facility];  Service: Obstetrics;  Laterality: N/A;   CESAREAN SECTION N/A 10/26/2019   Procedure: REPEAT CESAREAN SECTION;  Surgeon: Lazaro Arms, MD;  Location: [facility];  Service: Obstetrics;  Laterality: N/A;   CESAREAN SECTION N/A 07/12/2021   Procedure: CESAREAN SECTION;  Surgeon: Venora Maples, MD;  Location: [facility];  Service: Obstetrics;  Laterality: N/A;   HYDRADENITIS EXCISION Bilateral 01/29/2019   Procedure: Excision Hidradentitis groin  (right) and right and left flank;  Surgeon: Harriette Bouillon, MD;  Location: [facility];  Service: General;  Laterality: Bilateral;   INGUINAL HIDRADENITIS EXCISION     INTRAUTERINE DEVICE (IUD) INSERTION N/A 07/12/2021   Procedure: INTRAUTERINE DEVICE (IUD) INSERTION;  Surgeon: Venora Maples, MD;  Location: [facility];  Service: Obstetrics;  Laterality: N/A;   TONSILLECTOMY AND ADENOIDECTOMY      Family History  Problem Relation Age of Onset   Anemia Mother    Hypertension Mother    Other Mother        hidradenitis   Epilepsy Father    Hypertension Father    Other Sister        hidradenitis   Epilepsy Brother    Other Paternal Grandmother        aneursym   Cancer Paternal Grandfather    Hypertension Paternal Grandfather     Social History   Tobacco Use   Smoking status: Every Day    Packs/day: 0.50    Years: 3.00    Pack years: 1.50    Types: Cigarettes    Last attempt to quit: 06/02/2015    Years since quitting: 6.1   Smokeless  tobacco: Never  Vaping Use   Vaping Use: Never used  Substance Use Topics   Alcohol use: Not Currently    Alcohol/week: 0.0 standard drinks    Comment: on weekends   Drug use: No    Allergies:  Allergies  Allergen Reactions   Bee Venom Anaphylaxis   Amoxicillin Hives   Augmentin [Amoxicillin-Pot Clavulanate] Hives   Ceclor [Cefaclor] Hives   Penicillins Hives    Has patient had a PCN reaction causing immediate rash, facial/tongue/throat swelling, SOB or lightheadedness with hypotension: Yes Has patient had a PCN reaction causing severe rash involving mucus membranes or skin necrosis: No Has patient had a PCN reaction that required hospitalization No Has patient had a PCN reaction occurring within the last 10 years: No If all of the above answers are "NO", then may proceed with Cephalosporin use.    Sulfa Antibiotics Hives   Tomato Hives   Other Rash    Paper tape causes rash   Oxycodone Itching    Medications  Prior to Admission  Medication Sig Dispense Refill Last Dose   ferrous sulfate 325 (65 FE) MG tablet Take 1 tablet (325 mg total) by mouth every other day. 45 tablet 2 07/17/2021   HYDROcodone-acetaminophen (NORCO/VICODIN) 5-325 MG tablet Take 1-2 tablets by mouth every 4 (four) hours as needed for moderate pain. 30 tablet 0 07/17/2021   ibuprofen (ADVIL) 600 MG tablet Take 1 tablet (600 mg total) by mouth every 6 (six) hours as needed for mild pain, moderate pain or cramping. 30 tablet 0 07/17/2021   Prenat w/o A-FeCbGl-DSS-FA-DHA (CITRANATAL ASSURE) 35-1 & 300 MG tablet Take 1 tablet by mouth daily.   07/17/2021    Review of Systems  Constitutional: Negative.   Respiratory:  Negative for cough and shortness of breath.   Cardiovascular:  Positive for leg swelling. Negative for chest pain.  Gastrointestinal:  Positive for abdominal distention and abdominal pain (burning under incision).  Skin:  Positive for wound.  Physical Exam   Blood pressure 140/74, pulse 91, temperature 98.3 F (36.8 C), temperature source Oral, resp. rate 18, height 5' (1.524 m), weight 117.6 kg, SpO2 100 %, unknown if currently breastfeeding.  Patient Vitals for the past 24 hrs:  BP Temp Temp src Pulse Resp SpO2 Height Weight  07/18/21 2114 140/74 98.3 F (36.8 C) Oral 91 18 100 % -- --  07/18/21 1947 134/66 -- -- 91 18 -- -- --  07/18/21 1816 (!) 149/73 -- -- (!) 106 -- -- -- --  07/18/21 1756 (!) 150/85 98 F (36.7 C) Oral 94 20 -- 5' (1.524 m) 117.6 kg  07/18/21 1755 -- -- -- -- -- 99 % -- --    Physical Exam Vitals and nursing note reviewed.  Constitutional:      General: She is not in acute distress.    Appearance: She is well-developed. She is not ill-appearing or diaphoretic.  HENT:     Head: Normocephalic and atraumatic.  Eyes:     General: No scleral icterus. Pulmonary:     Effort: Pulmonary effort is normal. No respiratory distress.  Abdominal:     Comments: Wound vac not working. Foul smells  emanating from wound.  Wound vac & dressing removed using adhesive remover.    Musculoskeletal:     Right lower leg: 2+ Pitting Edema present.     Left lower leg: 2+ Pitting Edema present.  Skin:    General: Skin is warm and dry.  Neurological:     Mental  Status: She is alert.  Psychiatric:        Mood and Affect: Mood normal.        Behavior: Behavior normal.    MAU Course  Procedures Results for orders placed or performed during the hospital encounter of 07/18/21 (from the past 24 hour(s))  CBC     Status: Abnormal   Collection Time: 07/18/21  6:22 PM  Result Value Ref Range   WBC 10.6 (H) 4.0 - 10.5 K/uL   RBC 3.25 (L) 3.87 - 5.11 MIL/uL   Hemoglobin 9.4 (L) 12.0 - 15.0 g/dL   HCT 16.129.3 (L) 09.636.0 - 04.546.0 %   MCV 90.2 80.0 - 100.0 fL   MCH 28.9 26.0 - 34.0 pg   MCHC 32.1 30.0 - 36.0 g/dL   RDW 40.916.0 (H) 81.111.5 - 91.415.5 %   Platelets 357 150 - 400 K/uL   nRBC 0.0 0.0 - 0.2 %  Comprehensive metabolic panel     Status: Abnormal   Collection Time: 07/18/21  6:22 PM  Result Value Ref Range   Sodium 137 135 - 145 mmol/L   Potassium 3.9 3.5 - 5.1 mmol/L   Chloride 105 98 - 111 mmol/L   CO2 24 22 - 32 mmol/L   Glucose, Bld 79 70 - 99 mg/dL   BUN 5 (L) 6 - 20 mg/dL   Creatinine, Ser 7.820.63 0.44 - 1.00 mg/dL   Calcium 8.5 (L) 8.9 - 10.3 mg/dL   Total Protein 6.1 (L) 6.5 - 8.1 g/dL   Albumin 2.5 (L) 3.5 - 5.0 g/dL   AST 18 15 - 41 U/L   ALT 13 0 - 44 U/L   Alkaline Phosphatase 76 38 - 126 U/L   Total Bilirubin 0.4 0.3 - 1.2 mg/dL   GFR, Estimated >95>60 >62>60 mL/min   Anion gap 8 5 - 15    MDM 6 days postpartum. New onset hypertension. Asymptomatic & no severe range BPs. Labs ordered.   Wound vac evaluated with Dr. Shawnie PonsPratt. Will remove & assess wound.   [facility] CN[facility] Judeth HornLawrence, Erin, NP 07/18/2021 8:14 PM   Plan for abx per Dr. Shawnie PonsPratt. Verified with pt she can take Keflex, had last February w/o issues. PEC labs normal, will start Procardia. Stable for  discharge home.   Assessment and Plan   1. Wound infection   2. Postpartum hypertension    Discharge home Follow up at [facility] in 2 days as scheduled PEC precautions Return precautions Rx Keflex Rx Procardia  Allergies as of 07/18/2021       Reactions   Bee Venom Anaphylaxis   Amoxicillin Hives   Augmentin [amoxicillin-pot Clavulanate] Hives   Ceclor [cefaclor] Hives   Penicillins Hives   Has patient had a PCN reaction causing immediate rash, facial/tongue/throat swelling, SOB or lightheadedness with hypotension: Yes Has patient had a PCN reaction causing severe rash involving mucus membranes or skin necrosis: No Has patient had a PCN reaction that required hospitalization No Has patient had a PCN reaction occurring within the last 10 years: No If all of the above answers are "NO", then may proceed with Cephalosporin use.   Sulfa Antibiotics Hives   Tomato Hives   Other Rash   Paper tape causes rash   Oxycodone Itching        Medication List     TAKE these medications    cephALEXin 500 MG capsule Commonly known as: KEFLEX Take 1 capsule (500 mg total) by mouth 2 (two) times  daily for 7 days.   CitraNatal Assure 35-1 & 300 MG tablet Take 1 tablet by mouth daily.   ferrous sulfate 325 (65 FE) MG tablet Take 1 tablet (325 mg total) by mouth every other day.   HYDROcodone-acetaminophen 5-325 MG tablet Commonly known as: NORCO/VICODIN Take 1-2 tablets by mouth every 4 (four) hours as needed for moderate pain.   ibuprofen 600 MG tablet Commonly known as: ADVIL Take 1 tablet (600 mg total) by mouth every 6 (six) hours as needed for mild pain, moderate pain or cramping.   NIFEdipine 30 MG 24 hr tablet Commonly known as: ADALAT CC Take 1 tablet (30 mg total) by mouth daily.       Donette Larry, CNM  07/18/2021 9:27 PM

## 2021-07-18 NOTE — MAU Note (Signed)
Patient arrived to MAU complaining of leg swelling to the calves, abdominal swelling, and leakage of vac machine. Patient stated that when she left mau yesterday the vac alarmed went off, which indicated a leak. Patient stated that after the shower she heard an air sound, and drying off she noticed some greenish yellow drainage. Patient stated she tried to reinforce it with the gauze and tights to hold the dressing down for the vac to not alarm

## 2021-07-19 NOTE — Telephone Encounter (Signed)
Transition Care Management Unsuccessful Follow-up Telephone Call  Date of discharge and from where:  07/14/2021-[facility]  Attempts:  3rd Attempt  Reason for unsuccessful TCM follow-up call:  Unable to reach patient

## 2021-07-20 ENCOUNTER — Other Ambulatory Visit: Payer: Self-pay

## 2021-07-20 ENCOUNTER — Ambulatory Visit (INDEPENDENT_AMBULATORY_CARE_PROVIDER_SITE_OTHER): Payer: Medicaid Other | Admitting: Obstetrics & Gynecology

## 2021-07-20 ENCOUNTER — Encounter: Payer: Self-pay | Admitting: Obstetrics & Gynecology

## 2021-07-20 VITALS — BP 138/83 | HR 94 | Ht 60.0 in | Wt 257.2 lb

## 2021-07-20 DIAGNOSIS — Z4889 Encounter for other specified surgical aftercare: Secondary | ICD-10-CM | POA: Diagnosis not present

## 2021-07-20 DIAGNOSIS — O165 Unspecified maternal hypertension, complicating the puerperium: Secondary | ICD-10-CM

## 2021-07-20 DIAGNOSIS — G8918 Other acute postprocedural pain: Secondary | ICD-10-CM | POA: Diagnosis not present

## 2021-07-20 MED ORDER — NIFEDIPINE ER 30 MG PO TB24: 30.0000 mg | ORAL_TABLET | Freq: Every day | ORAL | 1 refills | Status: DC

## 2021-07-20 MED ORDER — OXYCODONE HCL 5 MG PO TABS: 5.0000 mg | ORAL_TABLET | Freq: Four times a day (QID) | ORAL | 0 refills | Status: AC | PRN

## 2021-07-20 NOTE — Progress Notes (Addendum)
    Post Partum Visit Note  Loretta Moore is a 25 y.o. G18P3003 female who presents for a postoperative visit. She is 1 week postop following a repeat C-section with IUD placement completed on 07/12/2021   Today she notes she is doing just ok- in review seen in ER on 8/14.  Vac removed- started on Keflex and Procardia, which she was not able to get as prescription was not ready at her pharmacy.  Pain is not great, taking ibuprofen regularly.  She ran out of oxycodone, but does feel like that was working better. Denies fever or chills.  Tolerating gen diet.  +Flatus, Regular BMs.   She has noted occasional HA, no blurry vision, minimal swelling.  Overall feels like she is doing better than when she was in the hospital.  The pregnancy intention screening data noted above was reviewed. Potential methods of contraception were discussed. The patient elected to proceed with bilateral tubal ligation.   Objective:  BP 138/83 (BP Location: Right Arm, Patient Position: Sitting, Cuff Size: Large)   Pulse 94   Ht 5' (1.524 m)   Wt 257 lb 3.2 oz (116.7 kg)   Breastfeeding Yes   BMI 50.23 kg/m    Physical Examination:  GENERAL ASSESSMENT: well developed and well nourished SKIN: warm and dry CHEST: normal air exchange, respiratory effort normal with no retractions HEART: regular rate and rhythm ABDOMEN: obese, soft, appropriately tender INCISION: C/D/I- drainage noted on pad, no active drainage appreciated, healing appropriately, no erythema, exudate or induration EXTREMITY: minimal edema, no calf tenderness bilaterally PSYCH: mood appropriate, normal affect       Assessment:    Postop incision check   Plan:   Postop care  -no evidence of infection, Keflex cancelled due to allergy  -continue on Clinda for HS  -reviewed care of incision and precautions  -Rx for oxycodone sent in- encouraged use only for breakthrough pain and continue with stool softener to avoid constipation  Postpartum  HTN  -Rx resent to different pharmacy- stressed importance of starting medication ASAP  -f/u for BP check in one week  Myna Hidalgo, DO Attending Obstetrician & Gynecologist, [facility] [employer], [facility]

## 2021-07-23 ENCOUNTER — Telehealth (HOSPITAL_COMMUNITY): Payer: Self-pay | Admitting: *Deleted

## 2021-07-23 NOTE — Telephone Encounter (Signed)
Mom reports feeling good. Incision healing well. Still on antibiotics. No concerns with herself. Call ended without EPDS completion. Mom reports baby is well and gaining weight. Feeding, peeing, and pooping without difficulty. Sleeps in bassinet on back. No concerns about baby.  Duffy Rhody, RN 07-23-2021 at 3:00pm

## 2021-07-28 ENCOUNTER — Encounter: Payer: Medicaid Other | Admitting: Advanced Practice Midwife

## 2021-08-18 ENCOUNTER — Other Ambulatory Visit: Payer: Self-pay

## 2021-08-18 ENCOUNTER — Ambulatory Visit (INDEPENDENT_AMBULATORY_CARE_PROVIDER_SITE_OTHER): Payer: Medicaid Other | Admitting: Women's Health

## 2021-08-18 ENCOUNTER — Encounter: Payer: Self-pay | Admitting: Women's Health

## 2021-08-18 DIAGNOSIS — Z98891 History of uterine scar from previous surgery: Secondary | ICD-10-CM

## 2021-08-18 DIAGNOSIS — Z30430 Encounter for insertion of intrauterine contraceptive device: Secondary | ICD-10-CM

## 2021-08-18 DIAGNOSIS — O165 Unspecified maternal hypertension, complicating the puerperium: Secondary | ICD-10-CM | POA: Diagnosis not present

## 2021-08-18 DIAGNOSIS — O285 Abnormal chromosomal and genetic finding on antenatal screening of mother: Secondary | ICD-10-CM

## 2021-08-18 NOTE — Progress Notes (Signed)
POSTPARTUM VISIT Patient name: Loretta Moore MRN [id number]  Date of birth: 10/06/1996 Chief Complaint:   Postpartum Care  History of Present Illness:   Loretta Moore is a 25 y.o. G16P3003 African American female being seen today for a postpartum visit. She is 5 weeks postpartum following a repeat cesarean section, low transverse incision at 39.2 gestational weeks, small uterine window noted. IOL: no, for n/a. Anesthesia:  CSE .  Laceration: n/a.  Complications: none. Inpatient contraception: yes Liletta IUD placed post-placentally .   Pregnancy uncomplicated. Tobacco use: yes. Substance use disorder: no. Last pap smear: 04/30/19 and results were NILM w/ HRHPV not done. Next pap smear due: 2023 No LMP recorded.  Postpartum course has been complicated by PPHTN, on nifedipine $RemoveBefor'30mg'gqtBIGRcfkNW$ . Reports c/s incision open . Bleeding  present, some clots . Bowel function is normal. Bladder function is normal. Urinary incontinence? no, fecal incontinence? no Patient is not sexually active. Last sexual activity: prior to birth of baby. Desired contraception: PP IUD placed. Patient does not want a pregnancy in the future.  Desired family size is 3 children.   Upstream - 08/18/21 1150       Pregnancy Intention Screening   Does the patient want to become pregnant in the next year? No    Does the patient's partner want to become pregnant in the next year? No    Would the patient like to discuss contraceptive options today? No      Contraception Wrap Up   Current Method IUD or IUS    End Method IUD or IUS    Contraception Counseling Provided Yes            The pregnancy intention screening data noted above was reviewed. Potential methods of contraception were discussed. The patient elected to proceed with IUD or IUS.  Edinburgh Postpartum Depression Screening: negative  Edinburgh Postnatal Depression Scale - 08/18/21 1150       Edinburgh Postnatal Depression Scale:  In the Past 7 Days   I have been  able to laugh and see the funny side of things. 0    I have looked forward with enjoyment to things. 0    I have blamed myself unnecessarily when things went wrong. 0    I have been anxious or worried for no good reason. 0    I have felt scared or panicky for no good reason. 0    Things have been getting on top of me. 1    I have been so unhappy that I have had difficulty sleeping. 0    I have felt sad or miserable. 0    I have been so unhappy that I have been crying. 0    The thought of harming myself has occurred to me. 0    Edinburgh Postnatal Depression Scale Total 1             GAD 7 : Generalized Anxiety Score 04/21/2021 01/05/2021  Nervous, Anxious, on Edge 1 0  Control/stop worrying 0 0  Worry too much - different things 0 0  Trouble relaxing 0 0  Restless 0 0  Easily annoyed or irritable 1 0  Afraid - awful might happen 0 0  Total GAD 7 Score 2 0     Baby's course has been uncomplicated. Baby is feeding by breast and bottle: milk supply inadequate . Infant has a pediatrician/family doctor? Yes.  Childcare strategy if returning to work/school: n/a-stay at home mom.  Pt has material needs met for  her and baby: Yes.   Review of Systems:   Pertinent items are noted in HPI Denies Abnormal vaginal discharge w/ itching/odor/irritation, headaches, visual changes, shortness of breath, chest pain, abdominal pain, severe nausea/vomiting, or problems with urination or bowel movements. Pertinent History Reviewed:  Reviewed past medical,surgical, obstetrical and family history.  Reviewed problem list, medications and allergies. OB History  Gravida Para Term Preterm AB Living  _0 SAB IAB Ectopic Multiple Live Births        0 3    # Outcome Date GA Lbr Len/2nd Weight Sex Delivery Anes PTL Lv  3 Term 07/12/21 [redacted]w[redacted]d 7 lb 5.8 oz (3.34 kg) F CS-Vac Spinal  LIV  2 Term 10/26/19 352w1d6 lb 13 oz (3.09 kg) M CS-Vac Spinal  LIV  1 Term 08/29/15 4139w2d lb 6.2 oz (3.351 kg) M  CS-LTranv EPI N LIV     Birth Comments: Hgb, Normal, FA Newborn Screen Barcode: [id number] Collected: 09/67/22/7737  Complications: Fetal Intolerance   Physical Assessment:   Vitals:   08/18/21 1148  BP: (!) 142/86  Pulse: 77  Weight: 246 lb (111.6 kg)  Body mass index is 48.04 kg/m.       Physical Examination:   General appearance: alert, well appearing, and in no distress  Mental status: alert, oriented to person, place, and time  Skin: warm & dry   Cardiovascular: normal heart rate noted   Respiratory: normal respiratory effort, no distress   Breasts: deferred, no complaints   Abdomen: soft, non-tender, C/S incision w/o erythema/induration/exudate. Small opening Rt side, larger ~2-3cm opening Lt side, good granulation tissue. Dr. OzaNelda Marse[place], assessed, not deep, packed w/ wet 4x4  Pelvic: IUD strings not visible. Thin prep pap obtained: No  Rectal: not examined  Extremities: Edema: none   Informal TV u/s by AmbSafeco Corporationltrasonographer: IUD in place  Chaperone: Latisha Cresenzo         No results found for this or any previous visit (from the past 24 hour(s)).  Assessment & Plan:  1) Postpartum exam 2) 5 wks s/p repeat cesarean section, low transverse incision, uterine window noted 3) breast & bottle feeding 4) Depression screening 5) Contraception> Liletta IUD in place, strings not visible 6) PPHTN> f/u 2wks for bp check, stop nifedipine 2d prior 7) Incisional dehiscence> 2 areas, Lt >Rt, no s/s infection. Per Dr. OzaNelda Marseilleck wet to dry dressings, let us [place] if worsening/not improving, f/u 2wks. Reviewed s/s infection, reasons to seek care sooner  Essential components of care per ACOG recommendations:  1.  Mood and well being:  If positive depression screen, discussed and plan developed.  If using tobacco we discussed reduction/cessation and risk of relapse If current substance abuse, we discussed and referral to local resources was offered.   2. Infant care and  feeding:  If breastfeeding, discussed returning to work, pumping, breastfeeding-associated pain, guidance regarding return to fertility while lactating if not using another method. If needed, patient was provided with a letter to be allowed to pump q 2-3hrs to support lactation in a private location with access to a refrigerator to store breastmilk.   Recommended that all caregivers be immunized for flu, pertussis and other preventable communicable diseases If pt does not have material needs met for her/baby, referred to local resources for help obtaining these.  3. Sexuality, contraception and birth spacing Provided guidance regarding sexuality, management of dyspareunia, and resumption of intercourse  Discussed avoiding interpregnancy interval <8mhs and recommended birth spacing of 18 months  4. Sleep and fatigue Discussed coping options for fatigue and sleep disruption Encouraged family/partner/community support of 4 hrs of uninterrupted sleep to help with mood and fatigue  5. Physical recovery  If pt had a C/S, assessed incisional pain and providing guidance on normal vs prolonged recovery If pt had a laceration, perineal healing and pain reviewed.  If urinary or fecal incontinence, discussed management and referred to PT or uro/gyn if indicated  Patient is safe to resume physical activity. Discussed attainment of healthy weight.  6.  Chronic disease management Discussed pregnancy complications if any, and their implications for future childbearing and long-term maternal health. Review recommendations for prevention of recurrent pregnancy complications, such as 17 hydroxyprogesterone caproate to reduce risk for recurrent PTB not applicable, or aspirin to reduce risk of preeclampsia not applicable. Pt had GDM: no. If yes, 2hr GTT scheduled: not applicable. Reviewed medications and non-pregnant dosing including consideration of whether pt is breastfeeding using a reliable resource such as  LactMed: not applicable Referred for f/u w/ PCP or subspecialist providers as indicated: not applicable  7. Health maintenance Mammogram at [phone] earlier if indicated Pap smears as indicated  Meds: No orders of the defined types were placed in this encounter.   Follow-up: Return in about 2 weeks (around 09/01/2021) for bp & incision check w/ md/cnm.   No orders of the defined types were placed in this encounter.   [place]an [place]ef[place] [facility]9/14/2022 12:44 PM

## 2021-08-18 NOTE — Patient Instructions (Addendum)
Stop bp medicine 2days before next visit  Tips To Increase Milk Supply  Lots of water! Enough so that your urine is clear  Plenty of calories, if you're not getting enough calories, your milk supply can decrease  Breastfeed/pump often, every 2-3 hours x 20-30mins  Fenugreek 3 pills 3 times a day, this may make your urine smell like maple syrup  Mother's Milk Tea  Lactation cookies, google for the recipe  Real oatmeal  Body Armor sports drinks  Greater Than hydration drink   

## 2021-08-23 DIAGNOSIS — Z792 Long term (current) use of antibiotics: Secondary | ICD-10-CM | POA: Diagnosis not present

## 2021-08-23 DIAGNOSIS — L732 Hidradenitis suppurativa: Secondary | ICD-10-CM | POA: Diagnosis not present

## 2021-08-23 DIAGNOSIS — Z5181 Encounter for therapeutic drug level monitoring: Secondary | ICD-10-CM | POA: Diagnosis not present

## 2021-09-01 ENCOUNTER — Encounter: Payer: Medicaid Other | Admitting: Women's Health

## 2021-09-09 DIAGNOSIS — E538 Deficiency of other specified B group vitamins: Secondary | ICD-10-CM | POA: Diagnosis not present

## 2021-09-09 DIAGNOSIS — R202 Paresthesia of skin: Secondary | ICD-10-CM | POA: Diagnosis not present

## 2021-09-15 ENCOUNTER — Encounter: Payer: Medicaid Other | Admitting: Advanced Practice Midwife

## 2021-12-13 DIAGNOSIS — L089 Local infection of the skin and subcutaneous tissue, unspecified: Secondary | ICD-10-CM | POA: Diagnosis not present

## 2021-12-13 DIAGNOSIS — L732 Hidradenitis suppurativa: Secondary | ICD-10-CM | POA: Diagnosis not present

## 2021-12-13 DIAGNOSIS — H5789 Other specified disorders of eye and adnexa: Secondary | ICD-10-CM | POA: Diagnosis not present

## 2021-12-13 DIAGNOSIS — Z5181 Encounter for therapeutic drug level monitoring: Secondary | ICD-10-CM | POA: Diagnosis not present

## 2021-12-13 DIAGNOSIS — Z79899 Other long term (current) drug therapy: Secondary | ICD-10-CM | POA: Diagnosis not present

## 2022-01-04 DIAGNOSIS — H5789 Other specified disorders of eye and adnexa: Secondary | ICD-10-CM | POA: Diagnosis not present

## 2022-01-04 DIAGNOSIS — L732 Hidradenitis suppurativa: Secondary | ICD-10-CM | POA: Diagnosis not present

## 2022-01-18 DIAGNOSIS — L720 Epidermal cyst: Secondary | ICD-10-CM | POA: Diagnosis not present

## 2022-02-11 DIAGNOSIS — H02825 Cysts of left lower eyelid: Secondary | ICD-10-CM | POA: Diagnosis not present

## 2022-02-11 DIAGNOSIS — L732 Hidradenitis suppurativa: Secondary | ICD-10-CM | POA: Diagnosis not present

## 2022-02-11 DIAGNOSIS — L720 Epidermal cyst: Secondary | ICD-10-CM | POA: Diagnosis not present

## 2022-03-08 DIAGNOSIS — L732 Hidradenitis suppurativa: Secondary | ICD-10-CM | POA: Diagnosis not present

## 2022-04-08 DIAGNOSIS — E538 Deficiency of other specified B group vitamins: Secondary | ICD-10-CM | POA: Diagnosis not present

## 2022-04-08 DIAGNOSIS — R202 Paresthesia of skin: Secondary | ICD-10-CM | POA: Diagnosis not present

## 2022-04-12 DIAGNOSIS — L732 Hidradenitis suppurativa: Secondary | ICD-10-CM | POA: Diagnosis not present

## 2022-04-18 DIAGNOSIS — L732 Hidradenitis suppurativa: Secondary | ICD-10-CM | POA: Diagnosis not present

## 2022-04-18 DIAGNOSIS — Z5181 Encounter for therapeutic drug level monitoring: Secondary | ICD-10-CM | POA: Diagnosis not present

## 2022-04-18 DIAGNOSIS — L089 Local infection of the skin and subcutaneous tissue, unspecified: Secondary | ICD-10-CM | POA: Diagnosis not present

## 2022-04-18 DIAGNOSIS — Z79899 Other long term (current) drug therapy: Secondary | ICD-10-CM | POA: Diagnosis not present

## 2022-04-20 DIAGNOSIS — T8189XA Other complications of procedures, not elsewhere classified, initial encounter: Secondary | ICD-10-CM | POA: Diagnosis not present

## 2022-04-22 DIAGNOSIS — L732 Hidradenitis suppurativa: Secondary | ICD-10-CM | POA: Diagnosis not present

## 2022-04-26 DIAGNOSIS — L732 Hidradenitis suppurativa: Secondary | ICD-10-CM | POA: Diagnosis not present

## 2022-05-10 DIAGNOSIS — L732 Hidradenitis suppurativa: Secondary | ICD-10-CM | POA: Diagnosis not present

## 2022-05-24 DIAGNOSIS — L732 Hidradenitis suppurativa: Secondary | ICD-10-CM | POA: Diagnosis not present

## 2022-06-14 DIAGNOSIS — L732 Hidradenitis suppurativa: Secondary | ICD-10-CM | POA: Diagnosis not present

## 2022-06-26 ENCOUNTER — Encounter: Payer: Self-pay | Admitting: Women's Health

## 2022-06-28 ENCOUNTER — Encounter: Payer: Self-pay | Admitting: Obstetrics & Gynecology

## 2022-06-28 ENCOUNTER — Ambulatory Visit: Payer: Medicaid Other | Admitting: Obstetrics & Gynecology

## 2022-06-28 VITALS — BP 128/87 | HR 90 | Ht 60.0 in | Wt 267.4 lb

## 2022-06-28 DIAGNOSIS — T8332XA Displacement of intrauterine contraceptive device, initial encounter: Secondary | ICD-10-CM

## 2022-06-28 DIAGNOSIS — Z3009 Encounter for other general counseling and advice on contraception: Secondary | ICD-10-CM | POA: Diagnosis not present

## 2022-06-28 DIAGNOSIS — Z30430 Encounter for insertion of intrauterine contraceptive device: Secondary | ICD-10-CM

## 2022-06-28 MED ORDER — LEVONORGESTREL 20.1 MCG/DAY IU IUD
1.0000 | INTRAUTERINE_SYSTEM | Freq: Once | INTRAUTERINE | Status: AC
  Administered 2022-06-28: 1 via INTRAUTERINE

## 2022-06-28 NOTE — Progress Notes (Signed)
GYN VISIT Patient name: Loretta Moore MRN [id number]  Date of birth: [birth year] Chief Complaint:   IUD check ("IUD hanging out")  History of Present Illness:   Loretta Moore is a 26 y.o. [phone] female being seen today for IUD concerns.     Started period this past Friday and the next morning, when she went to the bathroom, she can feel the strings hanging out.  She has also noted intense cramping.  Prior to this she was having regular periods. She reports no other acute complaints.  She does not desire another pregnancy and wants to discuss contraception.  No LMP recorded. (Menstrual status: IUD).     04/21/2021    9:37 AM 01/05/2021    2:41 PM 04/30/2019    3:12 PM  Depression screen PHQ 2/9  Decreased Interest 0 0 0  Down, Depressed, Hopeless 0 0 0  PHQ - 2 Score 0 0 0  Altered sleeping 2 0 1  Tired, decreased energy 1 0 1  Change in appetite 1 1 1   Feeling bad or failure about yourself  0 0 0  Trouble concentrating 0 0 0  Moving slowly or fidgety/restless 0 1 0  Suicidal thoughts 0 0 0  PHQ-9 Score 4 2 3      Review of Systems:   Pertinent items are noted in HPI Denies fever/chills, dizziness, headaches, visual disturbances, fatigue, shortness of breath, chest pain, abdominal pain, vomiting, no problems with bowel movements, urination, or intercourse unless otherwise stated above.  Pertinent History Reviewed:  Reviewed past medical,surgical, social, obstetrical and family history.  Reviewed problem list, medications and allergies. Physical Assessment:   Vitals:   06/28/22 0840  BP: 128/87  Pulse: 90  Weight: 267 lb 6.4 oz (121.3 kg)  Height: 5' (1.524 m)  Body mass index is 52.22 kg/m.       Physical Examination:   General appearance: alert, well appearing, and in no distress  Psych: mood appropriate, normal affect  Skin: warm & dry   Cardiovascular: normal heart rate noted  Respiratory: normal respiratory effort, no distress  Abdomen: soft, non-tender    Pelvic: 6cm of string hanging out past introitus.  Device removed with difficulty.  HS scarring noted bilateral inner thigh and vulvar.  Normal vaginal mucosa.  Cervix visualized no lesions or abnormalities noted.    Extremities: no edema   Chaperone: Latisha Cresenzo    IUD INSERTION   The risks and benefits of the method and placement have been thouroughly reviewed with the patient and all questions were answered.  Specifically the patient is aware of failure rate of 12/998, expulsion of the IUD and of possible perforation.  The patient is aware of irregular bleeding due to the method and understands the incidence of irregular bleeding diminishes with time.  Signed copy of informed consent in chart.    A sterile speculum was placed in the vagina.  The cervix was visualized, prepped using Betadine, and grasped with a single tooth tenaculum. The uterus was sounded to 9 cm.  Liletta  IUD placed per manufacturer's recommendations. The strings were trimmed to approximately 3 cm. The patient tolerated the procedure well.   Informal transvaginal sonogram was performed and the proper placement of the IUD was verified.  Chaperone:    Assessment & Plan:   1) Liletta IUD insertion due to IUD migration -device had migrated and was removed -discussed contraceptive options including permanent sterilization.  After discussion, she desires to have IUD replaced today.  Liletta placed as above  The patient was given post procedure instructions, including signs and symptoms of infection and to check for the strings after each menses or each month, and refraining from intercourse or anything in the vagina for 3 days. She was given a care card with date IUD placed, and date IUD to be removed. She is scheduled for a f/u appointment in 6-8 weeks.  Return in about 8 weeks (around 08/23/2022) for IUD check if possible Oline Belk.  Myna Hidalgo, DO Attending Obstetrician & Gynecologist, [facility] for [employer], [facility]

## 2022-07-21 DIAGNOSIS — R202 Paresthesia of skin: Secondary | ICD-10-CM | POA: Diagnosis not present

## 2022-08-24 ENCOUNTER — Ambulatory Visit: Payer: Medicaid Other | Admitting: Obstetrics & Gynecology

## 2022-08-24 ENCOUNTER — Encounter: Payer: Self-pay | Admitting: Obstetrics & Gynecology

## 2022-08-24 ENCOUNTER — Other Ambulatory Visit (HOSPITAL_COMMUNITY)
Admission: RE | Admit: 2022-08-24 | Discharge: 2022-08-24 | Disposition: A | Discharge status: Home / Self Care | Payer: Medicaid Other | Source: Ambulatory Visit | Attending: Obstetrics & Gynecology | Admitting: Obstetrics & Gynecology

## 2022-08-24 VITALS — BP 136/89 | HR 106 | Wt 262.8 lb

## 2022-08-24 DIAGNOSIS — Z124 Encounter for screening for malignant neoplasm of cervix: Secondary | ICD-10-CM | POA: Diagnosis not present

## 2022-08-24 DIAGNOSIS — Z975 Presence of (intrauterine) contraceptive device: Secondary | ICD-10-CM

## 2022-08-24 NOTE — Progress Notes (Signed)
   GYN VISIT Patient name: Loretta Moore MRN [id number]  Date of birth: February 14, 1996 Chief Complaint:   IUD check  History of Present Illness:   Loretta Moore is a 26 y.o. [phone]  female being seen today for the following concerns:  Liletta replaced on 07/20/2022  -no bleeding since delivery -some mild cramping- no meds -increased clear discharge- no itching or irritation  Having issues with HS flare- seen by [facility]  No LMP recorded. (Menstrual status: IUD).     04/21/2021    9:37 AM 01/05/2021    2:41 PM 04/30/2019    3:12 PM  Depression screen PHQ 2/9  Decreased Interest 0 0 0  Down, Depressed, Hopeless 0 0 0  PHQ - 2 Score 0 0 0  Altered sleeping 2 0 1  Tired, decreased energy 1 0 1  Change in appetite 1 1 1   Feeling bad or failure about yourself  0 0 0  Trouble concentrating 0 0 0  Moving slowly or fidgety/restless 0 1 0  Suicidal thoughts 0 0 0  PHQ-9 Score 4 2 3      Review of Systems:   Pertinent items are noted in HPI Denies fever/chills, dizziness, headaches, visual disturbances, fatigue, shortness of breath, chest pain, abdominal pain, vomiting, no problems with periods, bowel movements, urination, or intercourse unless otherwise stated above.  Pertinent History Reviewed:  Reviewed past medical,surgical, social, obstetrical and family history.  Reviewed problem list, medications and allergies. Physical Assessment:   Vitals:   08/24/22 1553  BP: 136/89  Pulse: (!) 106  Weight: 262 lb 12.8 oz (119.2 kg)  Body mass index is 51.32 kg/m.       Physical Examination:   General appearance: alert, well appearing, and in no distress  Psych: mood appropriate, normal affect  Skin: warm & dry   Cardiovascular: normal heart rate noted  Respiratory: normal respiratory effort, no distress  Abdomen: soft, non-tender   Pelvic: VULVA: significant scarring noted due to HS, no active lesions appreciated, VAGINA: normal appearing vagina with normal color and  discharge, no lesions, CERVIX: normal appearing cervix without discharge or lesions, difficulty seeing strings due to pt discomfort, pap obtained  Extremities: no edema   Chaperone: Loretta Moore    Assessment & Plan:  1) IUD in place -continue yearly checks -previously confirmed position by bedside US  2) Preventive screening -pap collected, further management pending results   Return in about 1 year (around 08/25/2023) for Annual.   Janyth Pupa, DO Attending [place], [place] for [employer], [place]

## 2022-08-27 LAB — CYTOLOGY - PAP
Chlamydia: NEGATIVE
Comment: NEGATIVE
Comment: NEGATIVE
Comment: NORMAL
Diagnosis: NEGATIVE
High risk HPV: NEGATIVE
Neisseria Gonorrhea: NEGATIVE

## 2022-09-02 DIAGNOSIS — G8929 Other chronic pain: Secondary | ICD-10-CM | POA: Diagnosis not present

## 2022-09-02 DIAGNOSIS — M542 Cervicalgia: Secondary | ICD-10-CM | POA: Diagnosis not present

## 2022-09-02 DIAGNOSIS — M545 Low back pain, unspecified: Secondary | ICD-10-CM | POA: Diagnosis not present

## 2022-09-02 DIAGNOSIS — R202 Paresthesia of skin: Secondary | ICD-10-CM | POA: Diagnosis not present

## 2022-09-26 DIAGNOSIS — R202 Paresthesia of skin: Secondary | ICD-10-CM | POA: Diagnosis not present

## 2022-09-26 DIAGNOSIS — M542 Cervicalgia: Secondary | ICD-10-CM | POA: Diagnosis not present

## 2022-09-26 DIAGNOSIS — M545 Low back pain, unspecified: Secondary | ICD-10-CM | POA: Diagnosis not present

## 2022-09-26 DIAGNOSIS — G8929 Other chronic pain: Secondary | ICD-10-CM | POA: Diagnosis not present

## 2022-09-28 DIAGNOSIS — M542 Cervicalgia: Secondary | ICD-10-CM | POA: Diagnosis not present

## 2022-09-28 DIAGNOSIS — R202 Paresthesia of skin: Secondary | ICD-10-CM | POA: Diagnosis not present

## 2022-09-28 DIAGNOSIS — G8929 Other chronic pain: Secondary | ICD-10-CM | POA: Diagnosis not present

## 2022-09-28 DIAGNOSIS — M545 Low back pain, unspecified: Secondary | ICD-10-CM | POA: Diagnosis not present

## 2022-10-03 DIAGNOSIS — M545 Low back pain, unspecified: Secondary | ICD-10-CM | POA: Diagnosis not present

## 2022-10-03 DIAGNOSIS — R202 Paresthesia of skin: Secondary | ICD-10-CM | POA: Diagnosis not present

## 2022-10-03 DIAGNOSIS — M542 Cervicalgia: Secondary | ICD-10-CM | POA: Diagnosis not present

## 2022-10-03 DIAGNOSIS — G8929 Other chronic pain: Secondary | ICD-10-CM | POA: Diagnosis not present

## 2022-10-12 DIAGNOSIS — R202 Paresthesia of skin: Secondary | ICD-10-CM | POA: Diagnosis not present

## 2022-10-12 DIAGNOSIS — G8929 Other chronic pain: Secondary | ICD-10-CM | POA: Diagnosis not present

## 2022-10-12 DIAGNOSIS — M542 Cervicalgia: Secondary | ICD-10-CM | POA: Diagnosis not present

## 2022-10-12 DIAGNOSIS — M545 Low back pain, unspecified: Secondary | ICD-10-CM | POA: Diagnosis not present

## 2022-11-02 DIAGNOSIS — M545 Low back pain, unspecified: Secondary | ICD-10-CM | POA: Diagnosis not present

## 2022-11-02 DIAGNOSIS — R202 Paresthesia of skin: Secondary | ICD-10-CM | POA: Diagnosis not present

## 2022-11-02 DIAGNOSIS — G8929 Other chronic pain: Secondary | ICD-10-CM | POA: Diagnosis not present

## 2022-11-02 DIAGNOSIS — M542 Cervicalgia: Secondary | ICD-10-CM | POA: Diagnosis not present

## 2022-11-17 DIAGNOSIS — L732 Hidradenitis suppurativa: Secondary | ICD-10-CM | POA: Diagnosis not present

## 2022-11-17 DIAGNOSIS — Z79620 Long term (current) use of immunosuppressive biologic: Secondary | ICD-10-CM | POA: Diagnosis not present

## 2022-11-17 DIAGNOSIS — Z5181 Encounter for therapeutic drug level monitoring: Secondary | ICD-10-CM | POA: Diagnosis not present

## 2022-12-29 DIAGNOSIS — A4189 Other specified sepsis: Secondary | ICD-10-CM | POA: Diagnosis not present

## 2022-12-29 DIAGNOSIS — E860 Dehydration: Secondary | ICD-10-CM | POA: Diagnosis not present

## 2022-12-29 DIAGNOSIS — Z8709 Personal history of other diseases of the respiratory system: Secondary | ICD-10-CM | POA: Diagnosis not present

## 2022-12-29 DIAGNOSIS — E6609 Other obesity due to excess calories: Secondary | ICD-10-CM | POA: Diagnosis not present

## 2022-12-29 DIAGNOSIS — Z79899 Other long term (current) drug therapy: Secondary | ICD-10-CM | POA: Diagnosis not present

## 2022-12-29 DIAGNOSIS — A419 Sepsis, unspecified organism: Secondary | ICD-10-CM | POA: Diagnosis not present

## 2022-12-29 DIAGNOSIS — R197 Diarrhea, unspecified: Secondary | ICD-10-CM | POA: Diagnosis not present

## 2022-12-29 DIAGNOSIS — Z792 Long term (current) use of antibiotics: Secondary | ICD-10-CM | POA: Diagnosis not present

## 2022-12-29 DIAGNOSIS — R509 Fever, unspecified: Secondary | ICD-10-CM | POA: Diagnosis not present

## 2022-12-29 DIAGNOSIS — R069 Unspecified abnormalities of breathing: Secondary | ICD-10-CM | POA: Diagnosis not present

## 2022-12-29 DIAGNOSIS — Z880 Allergy status to penicillin: Secondary | ICD-10-CM | POA: Diagnosis not present

## 2022-12-29 DIAGNOSIS — R059 Cough, unspecified: Secondary | ICD-10-CM | POA: Diagnosis not present

## 2022-12-29 DIAGNOSIS — I959 Hypotension, unspecified: Secondary | ICD-10-CM | POA: Diagnosis not present

## 2022-12-29 DIAGNOSIS — Z885 Allergy status to narcotic agent status: Secondary | ICD-10-CM | POA: Diagnosis not present

## 2022-12-29 DIAGNOSIS — L732 Hidradenitis suppurativa: Secondary | ICD-10-CM | POA: Diagnosis not present

## 2022-12-29 DIAGNOSIS — R0789 Other chest pain: Secondary | ICD-10-CM | POA: Diagnosis not present

## 2022-12-29 DIAGNOSIS — Z872 Personal history of diseases of the skin and subcutaneous tissue: Secondary | ICD-10-CM | POA: Diagnosis not present

## 2022-12-29 DIAGNOSIS — J101 Influenza due to other identified influenza virus with other respiratory manifestations: Secondary | ICD-10-CM | POA: Diagnosis not present

## 2022-12-29 DIAGNOSIS — J102 Influenza due to other identified influenza virus with gastrointestinal manifestations: Secondary | ICD-10-CM | POA: Diagnosis not present

## 2022-12-29 DIAGNOSIS — R6520 Severe sepsis without septic shock: Secondary | ICD-10-CM | POA: Diagnosis not present

## 2022-12-29 DIAGNOSIS — E871 Hypo-osmolality and hyponatremia: Secondary | ICD-10-CM | POA: Diagnosis not present

## 2022-12-29 DIAGNOSIS — Z6841 Body Mass Index (BMI) 40.0 and over, adult: Secondary | ICD-10-CM | POA: Diagnosis not present

## 2022-12-29 DIAGNOSIS — F17210 Nicotine dependence, cigarettes, uncomplicated: Secondary | ICD-10-CM | POA: Diagnosis not present

## 2022-12-29 DIAGNOSIS — D6959 Other secondary thrombocytopenia: Secondary | ICD-10-CM | POA: Diagnosis not present

## 2022-12-29 DIAGNOSIS — Z716 Tobacco abuse counseling: Secondary | ICD-10-CM | POA: Diagnosis not present

## 2022-12-29 DIAGNOSIS — Z20822 Contact with and (suspected) exposure to covid-19: Secondary | ICD-10-CM | POA: Diagnosis not present

## 2022-12-29 DIAGNOSIS — R0600 Dyspnea, unspecified: Secondary | ICD-10-CM | POA: Diagnosis not present

## 2022-12-29 DIAGNOSIS — R000 Tachycardia, unspecified: Secondary | ICD-10-CM | POA: Diagnosis not present

## 2022-12-29 DIAGNOSIS — Z882 Allergy status to sulfonamides status: Secondary | ICD-10-CM | POA: Diagnosis not present

## 2022-12-29 DIAGNOSIS — N179 Acute kidney failure, unspecified: Secondary | ICD-10-CM | POA: Diagnosis not present

## 2022-12-30 DIAGNOSIS — R197 Diarrhea, unspecified: Secondary | ICD-10-CM | POA: Diagnosis not present

## 2022-12-30 DIAGNOSIS — N179 Acute kidney failure, unspecified: Secondary | ICD-10-CM | POA: Diagnosis not present

## 2022-12-30 DIAGNOSIS — D696 Thrombocytopenia, unspecified: Secondary | ICD-10-CM | POA: Diagnosis not present

## 2022-12-30 DIAGNOSIS — F17210 Nicotine dependence, cigarettes, uncomplicated: Secondary | ICD-10-CM | POA: Diagnosis not present

## 2022-12-30 DIAGNOSIS — J101 Influenza due to other identified influenza virus with other respiratory manifestations: Secondary | ICD-10-CM | POA: Diagnosis not present

## 2022-12-30 DIAGNOSIS — J9811 Atelectasis: Secondary | ICD-10-CM | POA: Diagnosis not present

## 2022-12-30 DIAGNOSIS — Z8709 Personal history of other diseases of the respiratory system: Secondary | ICD-10-CM | POA: Diagnosis not present

## 2022-12-30 DIAGNOSIS — D72829 Elevated white blood cell count, unspecified: Secondary | ICD-10-CM | POA: Diagnosis not present

## 2022-12-30 DIAGNOSIS — E871 Hypo-osmolality and hyponatremia: Secondary | ICD-10-CM | POA: Diagnosis not present

## 2023-01-12 DIAGNOSIS — L732 Hidradenitis suppurativa: Secondary | ICD-10-CM | POA: Diagnosis not present

## 2023-01-12 DIAGNOSIS — L270 Generalized skin eruption due to drugs and medicaments taken internally: Secondary | ICD-10-CM | POA: Diagnosis not present

## 2023-01-12 DIAGNOSIS — Z79899 Other long term (current) drug therapy: Secondary | ICD-10-CM | POA: Diagnosis not present

## 2023-03-30 DIAGNOSIS — L732 Hidradenitis suppurativa: Secondary | ICD-10-CM | POA: Diagnosis not present

## 2023-05-17 DIAGNOSIS — L732 Hidradenitis suppurativa: Secondary | ICD-10-CM | POA: Diagnosis not present

## 2023-05-22 DIAGNOSIS — L732 Hidradenitis suppurativa: Secondary | ICD-10-CM | POA: Diagnosis not present

## 2023-05-30 DIAGNOSIS — L732 Hidradenitis suppurativa: Secondary | ICD-10-CM | POA: Diagnosis not present

## 2023-06-27 DIAGNOSIS — F17200 Nicotine dependence, unspecified, uncomplicated: Secondary | ICD-10-CM | POA: Diagnosis not present

## 2023-06-27 DIAGNOSIS — L732 Hidradenitis suppurativa: Secondary | ICD-10-CM | POA: Diagnosis not present

## 2023-07-25 DIAGNOSIS — L732 Hidradenitis suppurativa: Secondary | ICD-10-CM | POA: Diagnosis not present

## 2023-07-25 DIAGNOSIS — L929 Granulomatous disorder of the skin and subcutaneous tissue, unspecified: Secondary | ICD-10-CM | POA: Diagnosis not present

## 2023-07-25 DIAGNOSIS — S71111D Laceration without foreign body, right thigh, subsequent encounter: Secondary | ICD-10-CM | POA: Diagnosis not present

## 2023-07-30 DIAGNOSIS — J069 Acute upper respiratory infection, unspecified: Secondary | ICD-10-CM | POA: Diagnosis not present

## 2023-07-30 DIAGNOSIS — Z20822 Contact with and (suspected) exposure to covid-19: Secondary | ICD-10-CM | POA: Diagnosis not present

## 2023-08-01 DIAGNOSIS — L732 Hidradenitis suppurativa: Secondary | ICD-10-CM | POA: Diagnosis not present

## 2023-08-05 ENCOUNTER — Inpatient Hospital Stay (HOSPITAL_COMMUNITY)
Admission: EM | Admit: 2023-08-05 | Discharge: 2023-08-08 | DRG: 607 | Disposition: A | Discharge status: Home / Self Care | Payer: Medicaid Other | Attending: Internal Medicine | Admitting: Internal Medicine

## 2023-08-05 ENCOUNTER — Encounter (HOSPITAL_COMMUNITY): Payer: Self-pay

## 2023-08-05 ENCOUNTER — Emergency Department (HOSPITAL_COMMUNITY): Payer: Medicaid Other

## 2023-08-05 ENCOUNTER — Ambulatory Visit (HOSPITAL_COMMUNITY)
Admission: EM | Admit: 2023-08-05 | Discharge: 2023-08-05 | Disposition: A | Discharge status: Home / Self Care | Payer: Medicaid Other

## 2023-08-05 ENCOUNTER — Other Ambulatory Visit: Payer: Self-pay

## 2023-08-05 DIAGNOSIS — R000 Tachycardia, unspecified: Secondary | ICD-10-CM

## 2023-08-05 DIAGNOSIS — Z975 Presence of (intrauterine) contraceptive device: Secondary | ICD-10-CM

## 2023-08-05 DIAGNOSIS — Z881 Allergy status to other antibiotic agents status: Secondary | ICD-10-CM

## 2023-08-05 DIAGNOSIS — Z91018 Allergy to other foods: Secondary | ICD-10-CM | POA: Diagnosis not present

## 2023-08-05 DIAGNOSIS — A419 Sepsis, unspecified organism: Secondary | ICD-10-CM | POA: Diagnosis not present

## 2023-08-05 DIAGNOSIS — Z91030 Bee allergy status: Secondary | ICD-10-CM | POA: Diagnosis not present

## 2023-08-05 DIAGNOSIS — Z888 Allergy status to other drugs, medicaments and biological substances status: Secondary | ICD-10-CM

## 2023-08-05 DIAGNOSIS — Z880 Allergy status to penicillin: Secondary | ICD-10-CM

## 2023-08-05 DIAGNOSIS — F17210 Nicotine dependence, cigarettes, uncomplicated: Secondary | ICD-10-CM | POA: Diagnosis not present

## 2023-08-05 DIAGNOSIS — Z79620 Long term (current) use of immunosuppressive biologic: Secondary | ICD-10-CM

## 2023-08-05 DIAGNOSIS — R509 Fever, unspecified: Secondary | ICD-10-CM

## 2023-08-05 DIAGNOSIS — Z885 Allergy status to narcotic agent status: Secondary | ICD-10-CM | POA: Diagnosis not present

## 2023-08-05 DIAGNOSIS — Z882 Allergy status to sulfonamides status: Secondary | ICD-10-CM

## 2023-08-05 DIAGNOSIS — L03315 Cellulitis of perineum: Secondary | ICD-10-CM

## 2023-08-05 DIAGNOSIS — E871 Hypo-osmolality and hyponatremia: Secondary | ICD-10-CM

## 2023-08-05 DIAGNOSIS — Z820 Family history of epilepsy and other diseases of the nervous system: Secondary | ICD-10-CM | POA: Diagnosis not present

## 2023-08-05 DIAGNOSIS — H9190 Unspecified hearing loss, unspecified ear: Secondary | ICD-10-CM | POA: Diagnosis present

## 2023-08-05 DIAGNOSIS — Z1152 Encounter for screening for COVID-19: Secondary | ICD-10-CM

## 2023-08-05 DIAGNOSIS — R6510 Systemic inflammatory response syndrome (SIRS) of non-infectious origin without acute organ dysfunction: Secondary | ICD-10-CM | POA: Diagnosis present

## 2023-08-05 DIAGNOSIS — L02214 Cutaneous abscess of groin: Secondary | ICD-10-CM | POA: Diagnosis not present

## 2023-08-05 DIAGNOSIS — J45909 Unspecified asthma, uncomplicated: Secondary | ICD-10-CM | POA: Diagnosis not present

## 2023-08-05 DIAGNOSIS — L732 Hidradenitis suppurativa: Principal | ICD-10-CM | POA: Diagnosis present

## 2023-08-05 DIAGNOSIS — Z6841 Body Mass Index (BMI) 40.0 and over, adult: Secondary | ICD-10-CM

## 2023-08-05 DIAGNOSIS — L0231 Cutaneous abscess of buttock: Secondary | ICD-10-CM | POA: Diagnosis not present

## 2023-08-05 DIAGNOSIS — L0291 Cutaneous abscess, unspecified: Secondary | ICD-10-CM

## 2023-08-05 DIAGNOSIS — Z79899 Other long term (current) drug therapy: Secondary | ICD-10-CM

## 2023-08-05 DIAGNOSIS — L03317 Cellulitis of buttock: Secondary | ICD-10-CM | POA: Diagnosis not present

## 2023-08-05 DIAGNOSIS — R109 Unspecified abdominal pain: Secondary | ICD-10-CM | POA: Diagnosis not present

## 2023-08-05 DIAGNOSIS — Z8249 Family history of ischemic heart disease and other diseases of the circulatory system: Secondary | ICD-10-CM

## 2023-08-05 LAB — URINALYSIS, W/ REFLEX TO CULTURE (INFECTION SUSPECTED)
Bacteria, UA: NONE SEEN
Bilirubin Urine: NEGATIVE
Glucose, UA: NEGATIVE mg/dL
Ketones, ur: NEGATIVE mg/dL
Nitrite: NEGATIVE
Protein, ur: NEGATIVE mg/dL
Specific Gravity, Urine: 1.009 (ref 1.005–1.030)
pH: 6 (ref 5.0–8.0)

## 2023-08-05 LAB — CBC WITH DIFFERENTIAL/PLATELET
Abs Immature Granulocytes: 0.06 10*3/uL (ref 0.00–0.07)
Basophils Absolute: 0.1 10*3/uL (ref 0.0–0.1)
Basophils Relative: 0 %
Eosinophils Absolute: 0 10*3/uL (ref 0.0–0.5)
Eosinophils Relative: 0 %
HCT: 38.7 % (ref 36.0–46.0)
Hemoglobin: 13.2 g/dL (ref 12.0–15.0)
Immature Granulocytes: 0 %
Lymphocytes Relative: 18 %
Lymphs Abs: 2.8 10*3/uL (ref 0.7–4.0)
MCH: 33.6 pg (ref 26.0–34.0)
MCHC: 34.1 g/dL (ref 30.0–36.0)
MCV: 98.5 fL (ref 80.0–100.0)
Monocytes Absolute: 1.1 10*3/uL — ABNORMAL HIGH (ref 0.1–1.0)
Monocytes Relative: 7 %
Neutro Abs: 11.1 10*3/uL — ABNORMAL HIGH (ref 1.7–7.7)
Neutrophils Relative %: 75 %
Platelets: 326 10*3/uL (ref 150–400)
RBC: 3.93 MIL/uL (ref 3.87–5.11)
RDW: 16.9 % — ABNORMAL HIGH (ref 11.5–15.5)
WBC: 15.2 10*3/uL — ABNORMAL HIGH (ref 4.0–10.5)
nRBC: 0 % (ref 0.0–0.2)

## 2023-08-05 LAB — COMPREHENSIVE METABOLIC PANEL
ALT: 19 U/L (ref 0–44)
AST: 24 U/L (ref 15–41)
Albumin: 3 g/dL — ABNORMAL LOW (ref 3.5–5.0)
Alkaline Phosphatase: 97 U/L (ref 38–126)
Anion gap: 14 (ref 5–15)
BUN: <5 mg/dL — ABNORMAL LOW (ref 6–20)
CO2: 23 mmol/L (ref 22–32)
Calcium: 8.7 mg/dL — ABNORMAL LOW (ref 8.9–10.3)
Chloride: 95 mmol/L — ABNORMAL LOW (ref 98–111)
Creatinine, Ser: 0.81 mg/dL (ref 0.44–1.00)
GFR, Estimated: >60 mL/min (ref >60)
Glucose, Bld: 91 mg/dL (ref 70–99)
Potassium: 3.6 mmol/L (ref 3.5–5.1)
Sodium: 132 mmol/L — ABNORMAL LOW (ref 135–145)
Total Bilirubin: 0.7 mg/dL (ref 0.3–1.2)
Total Protein: 7.5 g/dL (ref 6.5–8.1)

## 2023-08-05 LAB — HCG, SERUM, QUALITATIVE: Preg, Serum: NEGATIVE

## 2023-08-05 LAB — I-STAT CG4 LACTIC ACID, ED
Lactic Acid, Venous: 0.9 mmol/L (ref 0.5–1.9)
Lactic Acid, Venous: 1.1 mmol/L (ref 0.5–1.9)

## 2023-08-05 LAB — RESP PANEL BY RT-PCR (RSV, FLU A&B, COVID)  RVPGX2
Influenza A by PCR: NEGATIVE
Influenza B by PCR: NEGATIVE
Resp Syncytial Virus by PCR: NEGATIVE
SARS Coronavirus 2 by RT PCR: NEGATIVE

## 2023-08-05 LAB — APTT: aPTT: 21 seconds — ABNORMAL LOW (ref 24–36)

## 2023-08-05 LAB — PROTIME-INR
INR: 1.1 (ref 0.8–1.2)
Prothrombin Time: 14.5 seconds (ref 11.4–15.2)

## 2023-08-05 MED ORDER — ONDANSETRON HCL 4 MG/2ML IJ SOLN
4.0000 mg | Freq: Once | INTRAMUSCULAR | Status: AC
  Administered 2023-08-05: 4 mg via INTRAVENOUS
  Filled 2023-08-05: qty 2

## 2023-08-05 MED ORDER — ACETAMINOPHEN 650 MG RE SUPP: 650.0000 mg | Freq: Four times a day (QID) | RECTAL | Status: DC | PRN

## 2023-08-05 MED ORDER — ENOXAPARIN SODIUM 40 MG/0.4ML IJ SOSY
40.0000 mg | PREFILLED_SYRINGE | INTRAMUSCULAR | Status: DC
  Administered 2023-08-06 – 2023-08-08 (×3): 40 mg via SUBCUTANEOUS
  Filled 2023-08-05 (×3): qty 0.4

## 2023-08-05 MED ORDER — LACTATED RINGERS IV BOLUS (SEPSIS)
1000.0000 mL | Freq: Once | INTRAVENOUS | Status: AC
  Administered 2023-08-05: 1000 mL via INTRAVENOUS

## 2023-08-05 MED ORDER — VANCOMYCIN HCL 2000 MG/400ML IV SOLN
2000.0000 mg | Freq: Once | INTRAVENOUS | Status: AC
  Administered 2023-08-05: 2000 mg via INTRAVENOUS
  Filled 2023-08-05: qty 400

## 2023-08-05 MED ORDER — IOHEXOL 350 MG/ML SOLN
75.0000 mL | Freq: Once | INTRAVENOUS | Status: AC | PRN
  Administered 2023-08-05: 75 mL via INTRAVENOUS

## 2023-08-05 MED ORDER — SODIUM CHLORIDE 0.9 % IV SOLN: 2.0000 g | INTRAVENOUS | Status: DC

## 2023-08-05 MED ORDER — LACTATED RINGERS IV SOLN: INTRAVENOUS | Status: AC

## 2023-08-05 MED ORDER — SODIUM CHLORIDE 0.9 % IV SOLN
2.0000 g | Freq: Once | INTRAVENOUS | Status: AC
  Administered 2023-08-05: 2 g via INTRAVENOUS
  Filled 2023-08-05: qty 20

## 2023-08-05 MED ORDER — NALOXONE HCL 0.4 MG/ML IJ SOLN: 0.4000 mg | INTRAMUSCULAR | Status: DC | PRN

## 2023-08-05 MED ORDER — LACTATED RINGERS IV SOLN: INTRAVENOUS | Status: DC

## 2023-08-05 MED ORDER — MORPHINE SULFATE (PF) 2 MG/ML IV SOLN
1.0000 mg | INTRAVENOUS | Status: DC | PRN
  Administered 2023-08-05 – 2023-08-08 (×10): 1 mg via INTRAVENOUS
  Filled 2023-08-05 (×10): qty 1

## 2023-08-05 MED ORDER — MORPHINE SULFATE (PF) 4 MG/ML IV SOLN
4.0000 mg | Freq: Once | INTRAVENOUS | Status: AC
  Administered 2023-08-05: 4 mg via INTRAVENOUS
  Filled 2023-08-05: qty 1

## 2023-08-05 MED ORDER — VANCOMYCIN HCL IN DEXTROSE 1-5 GM/200ML-% IV SOLN: 1000.0000 mg | Freq: Once | INTRAVENOUS | Status: DC

## 2023-08-05 MED ORDER — LEVALBUTEROL HCL 0.63 MG/3ML IN NEBU: 0.6300 mg | INHALATION_SOLUTION | Freq: Four times a day (QID) | RESPIRATORY_TRACT | Status: DC | PRN

## 2023-08-05 MED ORDER — ACETAMINOPHEN 325 MG PO TABS: 650.0000 mg | ORAL_TABLET | Freq: Four times a day (QID) | ORAL | Status: DC | PRN

## 2023-08-05 MED ORDER — LACTATED RINGERS IV BOLUS (SEPSIS)
500.0000 mL | Freq: Once | INTRAVENOUS | Status: AC
  Administered 2023-08-05: 500 mL via INTRAVENOUS

## 2023-08-05 NOTE — ED Notes (Signed)
Patient transported to CT 

## 2023-08-05 NOTE — ED Notes (Signed)
Patient is being discharged from the Urgent Care and sent to the Emergency Department via Personal Vehicle . Per Eunice Blase, NP, patient is in need of higher level of care due to Possible Sepsis. Patient is aware and verbalizes understanding of plan of care.  Vitals:   08/05/23 1545 08/05/23 1643  BP: 135/77 115/76  Pulse: (!) 120 (!) 111  Resp: 17   Temp: 99.3 F (37.4 C) 99.7 F (37.6 C)  SpO2: 95% 95%

## 2023-08-05 NOTE — ED Triage Notes (Signed)
Pt has hx of hidradenitis suppurativa. Reports recent surgery on right leg but now she is having pain and swelling to her left leg. Pt also reports drainage from her c section scar for the past 2 weeks. Pt sent here from UC for septic workup

## 2023-08-05 NOTE — ED Provider Notes (Signed)
[facility]    CSN: [id number] Arrival date & time: 08/05/23  1458      History   Chief Complaint Chief Complaint  Patient presents with   Abscess    HPI Loretta Moore is a 27 y.o. female.  Patient is reporting new abscess on her inner left gluteal fold/perineum area.  Patient reports long history of hidradenitis suppurative.  She was recently at dermatology for I&D of the right thigh.  And she will be picking up antibiotics today as they were not previously available.  Was also recently seen by urgent care and given doxycycline for respiratory infection symptoms.  She is currently completing the doxycycline as ordered.  The history is provided by the patient.  Abscess Location:  Pelvis Pelvic abscess location:  L buttock and perineum Size:  6cm x 5cm Abscess quality: induration, painful and warmth   Red streaking: no   Progression:  Worsening Pain details:    Quality:  Pressure and throbbing   Severity:  Moderate   Progression:  Worsening Context comment:  Hidronitis supprative Relieved by:  Nothing Ineffective treatments:  Oral antibiotics and warm compresses Associated symptoms: fever   Risk factors: prior abscess     Past Medical History:  Diagnosis Date   Anemia    Asthma    Hard of hearing    Hidradenitis suppurativa    had excisions in axilla and groin    Patient Active Problem List   Diagnosis Date Noted   Postpartum hypertension 08/18/2021   Encounter for IUD insertion 08/18/2021   Abnormal chromosomal and genetic finding on antenatal screening mother 01/18/2021   Cyst of eye 01/05/2021   Marijuana use 05/02/2019   S/P cesarean section 09/01/2015   Asthma 04/29/2015   Smoker 04/29/2015   Hidradenitis suppurativa    Hard of hearing     Past Surgical History:  Procedure Laterality Date   AXILLARY HIDRADENITIS EXCISION     CESAREAN SECTION N/A 08/29/2015   Procedure: CESAREAN SECTION;  Surgeon: Levie Heritage, DO;  Location: [facility];   Service: Obstetrics;  Laterality: N/A;   CESAREAN SECTION N/A 10/26/2019   Procedure: REPEAT CESAREAN SECTION;  Surgeon: Lazaro Arms, MD;  Location: [facility];  Service: Obstetrics;  Laterality: N/A;   CESAREAN SECTION N/A 07/12/2021   Procedure: CESAREAN SECTION;  Surgeon: Venora Maples, MD;  Location: [facility];  Service: Obstetrics;  Laterality: N/A;   HYDRADENITIS EXCISION Bilateral 01/29/2019   Procedure: Excision Hidradentitis groin (right) and right and left flank;  Surgeon: Harriette Bouillon, MD;  Location:  SURGERY CENTER;  Service: General;  Laterality: Bilateral;   INGUINAL HIDRADENITIS EXCISION     INTRAUTERINE DEVICE (IUD) INSERTION N/A 07/12/2021   Procedure: INTRAUTERINE DEVICE (IUD) INSERTION;  Surgeon: Venora Maples, MD;  Location: [facility];  Service: Obstetrics;  Laterality: N/A;   TONSILLECTOMY AND ADENOIDECTOMY      OB History     Gravida  3   Para  3   Term  3   Preterm      AB      Living  3      SAB      IAB      Ectopic      Multiple  0   Live Births  3            Home Medications    Prior to Admission medications   Medication Sig Start Date End Date Taking? Authorizing Provider  albuterol (VENTOLIN HFA) 108 (90 Base) MCG/ACT inhaler Inhale into the lungs. 04/20/22  Yes [provider]  dapsone 25 MG tablet Take 25 mg by mouth daily. 11/21/22 11/16/23 Yes [provider]  hydrocortisone 2.5 % cream Apply 1 Application topically. 01/12/23  Yes [provider]  lidocaine (XYLOCAINE) 2 % jelly Apply 1 Application topically daily. 07/25/23  Yes [provider]  metroNIDAZOLE (FLAGYL) 500 MG tablet Take 500 mg by mouth. 08/05/23 08/19/23 Yes [provider]  moxifloxacin (AVELOX) 400 MG tablet Take 400 mg by mouth. 08/05/23 08/19/23 Yes [provider]  rifampin (RIFADIN) 300 MG capsule Take 300 mg by mouth 2 (two) times daily. 08/05/23 08/19/23 Yes [provider]  silver  sulfADIAZINE (SILVADENE) 1 % cream Apply 1 Application topically daily. 01/12/23  Yes [provider]  spironolactone (ALDACTONE) 25 MG tablet Take 25 mg by mouth daily. 11/17/22 05/08/24 Yes [provider]  triamcinolone cream (KENALOG) 0.1 % Apply 1 Application topically daily. 01/12/23  Yes [provider]  doxycycline (VIBRAMYCIN) 100 MG capsule Take 100 mg by mouth 2 (two) times daily. 04/22/22   [provider]  ferrous sulfate 325 (65 FE) MG tablet Take 1 tablet (325 mg total) by mouth every other day. Patient not taking: Reported on 08/24/2022 04/27/21   Cheral Marker, CNM  gabapentin (NEURONTIN) 100 MG capsule Take 100 mg by mouth 3 (three) times daily.    [provider]  HUMIRA PEN 40 MG/0.4ML PNKT SMARTSIG:40 Milligram(s) SUB-Q Once a Week 05/25/22   [provider]  HYDROcodone-acetaminophen (NORCO/VICODIN) 5-325 MG tablet Take 1-2 tablets by mouth every 4 (four) hours as needed for moderate pain. Patient not taking: Reported on 07/20/2021 07/14/21   Cheral Marker, CNM  ibuprofen (ADVIL) 600 MG tablet Take 1 tablet (600 mg total) by mouth every 6 (six) hours as needed for mild pain, moderate pain or cramping. 07/14/21   Cheral Marker, CNM  Multiple Vitamins-Minerals (MULTIVITAMIN GUMMIES ADULT PO) Take by mouth.    [provider]    Family History Family History  Problem Relation Age of Onset   Anemia Mother    Hypertension Mother    Other Mother        hidradenitis   Epilepsy Father    Hypertension Father    Other Sister        hidradenitis   Epilepsy Brother    Other Paternal Grandmother        aneursym   Cancer Paternal Grandfather    Hypertension Paternal Grandfather     Social History Social History   Tobacco Use   Smoking status: Every Day    Current packs/day: 0.00    Average packs/day: 0.5 packs/day for 3.0 years (1.5 ttl pk-yrs)    Types: Cigarettes    Start date: 06/01/2012    Last  attempt to quit: 06/02/2015    Years since quitting: 8.1   Smokeless tobacco: Never  Vaping Use   Vaping status: Never Used  Substance Use Topics   Alcohol use: Not Currently    Alcohol/week: 0.0 standard drinks of alcohol    Comment: on weekends   Drug use: No     Allergies   Bee venom, Amoxicillin, Augmentin [amoxicillin-pot clavulanate], Ceclor [cefaclor], Penicillins, Sulfa antibiotics, Tamiflu [oseltamivir], Tomato, Other, and Oxycodone   Review of Systems Review of Systems  Constitutional:  Positive for chills and fever.  Skin:  Positive for color change and wound.  Allergic/Immunologic: Positive for immunocompromised state.  Physical Exam Triage Vital Signs ED Triage Vitals  Encounter Vitals Group     BP 08/05/23 1545 135/77     Systolic BP Percentile --      Diastolic BP Percentile --      Pulse Rate 08/05/23 1545 (!) 120     Resp 08/05/23 1545 17     Temp 08/05/23 1545 99.3 F (37.4 C)     Temp Source 08/05/23 1545 Oral     SpO2 08/05/23 1545 95 %     Weight 08/05/23 1545 243 lb (110.2 kg)     Height 08/05/23 1545 5' (1.524 m)     Head Circumference --      Peak Flow --      Pain Score 08/05/23 1540 9     Pain Loc --      Pain Education --      Exclude from Growth Chart --    No data found.  Updated Vital Signs BP 115/76 (BP Location: Left Arm)   Pulse (!) 111   Temp 99.7 F (37.6 C) (Oral)   Resp 17   Ht 5' (1.524 m)   Wt 243 lb (110.2 kg)   LMP 07/20/2023 (Exact Date)   SpO2 95%   BMI 47.46 kg/m   Visual Acuity Right Eye Distance:   Left Eye Distance:   Bilateral Distance:    Right Eye Near:   Left Eye Near:    Bilateral Near:     Physical Exam Skin:    Findings: Wound present.     Comments: Patient has 3 areas of current wound drainage. 1.  Right lower abdomen.  1 cm opening with tunneling of 4 cm noted 2.  Right inner thigh with surgical I&D 2 weeks ago. 3.  Left gluteal fold perineum.  Measure approximately 6 cm x 5 cm of  hard indurated area tender to the touch. All areas have purulent  bloody drainage      UC Treatments / Results  Labs (all labs ordered are listed, but only abnormal results are displayed) Labs Reviewed - No data to display  EKG   Radiology No results found.  Procedures Procedures (including critical care time)  Medications Ordered in UC Medications - No data to display  Initial Impression / Assessment and Plan / UC Course  I have reviewed the triage vital signs and the nursing notes.  Pertinent labs & imaging results that were available during my care of the patient were reviewed by me and considered in my medical decision making (see chart for details).   Patient has been feeling ill for several weeks.  She has seen her dermatologist has a I&D on the right inner thigh, has seen urgent care for infections and is currently on doxycycline.  Patient has long history of hidradenitis suppurativa.  Area on left inner gluteal fold and perineum is hard hot indurated.  Possible concern for sepsis with fever, tachycardia of 120 initially.  She is currently on Humira which makes her immunocompromise and she is currently on doxycycline with orders from her dermatologist for additional antibiotics.  I have discussed this with the patient and my concern for sepsis.  And she is willing to present to the ER for workup   Final Clinical Impressions(s) / UC Diagnoses   Final diagnoses:  Abscess of buttock, left  Fever, unspecified  Cellulitis of perineum  Heart rate fast  Hidradenitis suppurativa of multiple sites     Discharge Instructions      Discharged  to ER for increased workup of possible sepsis.     ED Prescriptions   None    PDMP not reviewed this encounter.   Nelda Marseille, NP 08/05/23 [phone]

## 2023-08-05 NOTE — ED Provider Notes (Addendum)
[place] EMERGENCY DEPARTMENT AT [facility] Provider Note   CSN: [id number] Arrival date & time: 08/05/23  1743     History  Chief Complaint  Patient presents with   Leg Pain   Abdominal Pain   Abscess    Loretta Moore is a 27 y.o. female.  This is a 27 year old female who is presenting to the emergency department today due to fever and pain in her groin.  Patient has a history of HS and has been taking doxycycline as prescribed by her dermatologist.  She has had surgical intervention on her HS in June.  She went to an urgent care today because of increased pain and swelling in the area.  She has been having discharge from a wound in her left groin.   Leg Pain Abdominal Pain Abscess      Home Medications Prior to Admission medications   Medication Sig Start Date End Date Taking? Authorizing Provider  albuterol (VENTOLIN HFA) 108 (90 Base) MCG/ACT inhaler Inhale into the lungs. 04/20/22   [provider]  dapsone 25 MG tablet Take 25 mg by mouth daily. 11/21/22 11/16/23  [provider]  doxycycline (VIBRAMYCIN) 100 MG capsule Take 100 mg by mouth 2 (two) times daily. 04/22/22   [provider]  ferrous sulfate 325 (65 FE) MG tablet Take 1 tablet (325 mg total) by mouth every other day. Patient not taking: Reported on 08/24/2022 04/27/21   Cheral Marker, CNM  gabapentin (NEURONTIN) 100 MG capsule Take 100 mg by mouth 3 (three) times daily.    [provider]  HUMIRA PEN 40 MG/0.4ML PNKT SMARTSIG:40 Milligram(s) SUB-Q Once a Week 05/25/22   [provider]  HYDROcodone-acetaminophen (NORCO/VICODIN) 5-325 MG tablet Take 1-2 tablets by mouth every 4 (four) hours as needed for moderate pain. Patient not taking: Reported on 07/20/2021 07/14/21   Cheral Marker, CNM  hydrocortisone 2.5 % cream Apply 1 Application topically. 01/12/23   [provider]  ibuprofen (ADVIL) 600 MG tablet Take 1 tablet (600 mg total) by  mouth every 6 (six) hours as needed for mild pain, moderate pain or cramping. 07/14/21   Cheral Marker, CNM  lidocaine (XYLOCAINE) 2 % jelly Apply 1 Application topically daily. 07/25/23   [provider]  metroNIDAZOLE (FLAGYL) 500 MG tablet Take 500 mg by mouth. 08/05/23 08/19/23  [provider]  moxifloxacin (AVELOX) 400 MG tablet Take 400 mg by mouth. 08/05/23 08/19/23  [provider]  Multiple Vitamins-Minerals (MULTIVITAMIN GUMMIES ADULT PO) Take by mouth.    [provider]  rifampin (RIFADIN) 300 MG capsule Take 300 mg by mouth 2 (two) times daily. 08/05/23 08/19/23  [provider]  silver sulfADIAZINE (SILVADENE) 1 % cream Apply 1 Application topically daily. 01/12/23   [provider]  spironolactone (ALDACTONE) 25 MG tablet Take 25 mg by mouth daily. 11/17/22 05/08/24  [provider]  triamcinolone cream (KENALOG) 0.1 % Apply 1 Application topically daily. 01/12/23   [provider]      Allergies    Bee venom, Amoxicillin, Augmentin [amoxicillin-pot clavulanate], Ceclor [cefaclor], Penicillins, Sulfa antibiotics, Tamiflu [oseltamivir], Tomato, Other, and Oxycodone    Review of Systems   Review of Systems  Gastrointestinal:  Positive for abdominal pain.    Physical Exam Updated Vital Signs BP 111/69 (BP Location: Left Arm)   Pulse (!) 120   Temp 99.2 F (37.3 C) (Oral)   Resp (!) 22   Ht 5' (1.524 m)   Wt  110.2 kg   LMP 07/20/2023 (Exact Date)   SpO2 100%   BMI 47.45 kg/m  Physical Exam Vitals reviewed.  Constitutional:      Appearance: She is well-developed.  Pulmonary:     Effort: Pulmonary effort is normal.  Abdominal:     Tenderness: There is no abdominal tenderness.  Skin:    Comments: In the left gluteal fold that there is a area of warmth, fluctuance, some purulent drainage.  Neurological:     Mental Status: She is alert.     ED Results / Procedures / Treatments   Labs (all labs  ordered are listed, but only abnormal results are displayed) Labs Reviewed  COMPREHENSIVE METABOLIC PANEL - Abnormal; Notable for the following components:      Result Value   Sodium 132 (*)    Chloride 95 (*)    BUN <5 (*)    Calcium 8.7 (*)    Albumin 3.0 (*)    All other components within normal limits  CBC WITH DIFFERENTIAL/PLATELET - Abnormal; Notable for the following components:   WBC 15.2 (*)    RDW 16.9 (*)    Neutro Abs 11.1 (*)    Monocytes Absolute 1.1 (*)    All other components within normal limits  URINALYSIS, W/ REFLEX TO CULTURE (INFECTION SUSPECTED) - Abnormal; Notable for the following components:   APPearance HAZY (*)    Hgb urine dipstick SMALL (*)    Leukocytes,Ua SMALL (*)    All other components within normal limits  APTT - Abnormal; Notable for the following components:   aPTT 21 (*)    All other components within normal limits  CULTURE, BLOOD (ROUTINE X 2)  CULTURE, BLOOD (ROUTINE X 2)  RESP PANEL BY RT-PCR (RSV, FLU A&B, COVID)  RVPGX2  PROTIME-INR  HCG, SERUM, QUALITATIVE  I-STAT CG4 LACTIC ACID, ED  I-STAT CG4 LACTIC ACID, ED  I-STAT CG4 LACTIC ACID, ED  I-STAT CG4 LACTIC ACID, ED  I-STAT CG4 LACTIC ACID, ED  I-STAT CG4 LACTIC ACID, ED    EKG None  Radiology No results found.  Procedures Procedures    Medications Ordered in ED Medications  lactated ringers infusion (has no administration in time range)  lactated ringers bolus 1,000 mL (1,000 mLs Intravenous New Bag/Given 08/05/23 2006)    And  lactated ringers bolus 1,000 mL (1,000 mLs Intravenous New Bag/Given 08/05/23 2023)    And  lactated ringers bolus 1,000 mL (1,000 mLs Intravenous New Bag/Given 08/05/23 2105)    And  lactated ringers bolus 500 mL (has no administration in time range)  vancomycin (VANCOREADY) IVPB 2000 mg/400 mL (2,000 mg Intravenous New Bag/Given 08/05/23 2104)  cefTRIAXone (ROCEPHIN) 2 g in sodium chloride 0.9 % 100 mL IVPB (0 g Intravenous Stopped 08/05/23  2104)  morphine (PF) 4 MG/ML injection 4 mg (4 mg Intravenous Given 08/05/23 2100)  ondansetron (ZOFRAN) injection 4 mg (4 mg Intravenous Given 08/05/23 2059)  iohexol (OMNIPAQUE) 350 MG/ML injection 75 mL (75 mLs Intravenous Contrast Given 08/05/23 2043)    ED Course/ Medical Decision Making/ A&P                                 Medical Decision Making This is a 27 year old female presenting to the emergency department with fever, swelling and warmth in the groin.  Plan- upon arrival to the emergency department, patient does meet SIRS criteria.  Will start the patient on broad-spectrum antibiotics.  I do believe that the patient may possibly have sepsis.  With the patient's history of HS, patient is not a good candidate for emergency department incision and drainage.  Will obtain CT imaging of the area to assess for tracking abscess.  Reassessment-no acute findings on patient's CT scan of the abdomen pelvis.  Patient with negative lactic acid.  Based on the patient's labs, I believe the patient has mild sepsis.  I have reassessed the patient following fluid bolus.  Will admit to hospitalist.  Amount and/or Complexity of Data Reviewed Labs: ordered. Radiology: ordered. ECG/medicine tests: ordered.  Risk Prescription drug management.           Final Clinical Impression(s) / ED Diagnoses Final diagnoses:  Cellulitis of buttock  Abscess    Rx / DC Orders ED Discharge Orders     None         Arletha Pili, DO 08/05/23 2121    Anders Simmonds T, DO 08/05/23 2135

## 2023-08-05 NOTE — H&P (Signed)
History and Physical    Loretta Moore ZDG:[id number] DOB: 1996-04-13 DOA: 08/05/2023  PCP: Scheryl Marten, MD  Patient coming from: Home  Chief Complaint: Left groin wound  HPI: Loretta Moore is a 27 y.o. female with medical history significant of hidradenitis suppurativa, anemia, asthma presented to ED for evaluation of drainage from her left groin wound with pain.  She had surgical intervention done by her dermatologist in June and is currently on doxycycline.  In the ED, patient was tachycardic to the 120s but afebrile.  Not hypotensive.  Labs notable for WBC 15.2, sodium 132, chloride 95, lactic acid normal x 2, blood cultures collected.  CT without evidence of abscess. Patient was given morphine, Zofran, vancomycin, ceftriaxone, and 3.5 L IV fluid boluses.  Patient states she has hidradenitis suppurativa and has been on clindamycin for several months due to wounds in her groin region.  States she had surgery done by her dermatologist on her right upper thigh in June and saw dermatology again 2 weeks ago and was told that it was healing well.  However, now having increasing pain and drainage from her left groin/inner gluteal region wounds.  A week ago she was switched from clindamycin to doxycycline.  She has had chills at home but whenever she checked her temperature it was around 97 F.  States about a week ago she had cough and shortness of breath for which she was seen at urgent care and tested negative for COVID.  Her symptoms have now improved.  Denies chest pain.  Review of Systems:  Review of Systems  All other systems reviewed and are negative.   Past Medical History:  Diagnosis Date   Anemia    Asthma    Hard of hearing    Hidradenitis suppurativa    had excisions in axilla and groin    Past Surgical History:  Procedure Laterality Date   AXILLARY HIDRADENITIS EXCISION     CESAREAN SECTION N/A 08/29/2015   Procedure: CESAREAN SECTION;  Surgeon: Levie Heritage, DO;   Location: [facility];  Service: Obstetrics;  Laterality: N/A;   CESAREAN SECTION N/A 10/26/2019   Procedure: REPEAT CESAREAN SECTION;  Surgeon: Lazaro Arms, MD;  Location: [facility];  Service: Obstetrics;  Laterality: N/A;   CESAREAN SECTION N/A 07/12/2021   Procedure: CESAREAN SECTION;  Surgeon: Venora Maples, MD;  Location: [facility];  Service: Obstetrics;  Laterality: N/A;   HYDRADENITIS EXCISION Bilateral 01/29/2019   Procedure: Excision Hidradentitis groin (right) and right and left flank;  Surgeon: Harriette Bouillon, MD;  Location: [facility];  Service: General;  Laterality: Bilateral;   INGUINAL HIDRADENITIS EXCISION     INTRAUTERINE DEVICE (IUD) INSERTION N/A 07/12/2021   Procedure: INTRAUTERINE DEVICE (IUD) INSERTION;  Surgeon: Venora Maples, MD;  Location: [facility];  Service: Obstetrics;  Laterality: N/A;   TONSILLECTOMY AND ADENOIDECTOMY       reports that she has been smoking cigarettes. She started smoking about 11 years ago. She has a 1.5 pack-year smoking history. She has never used smokeless tobacco. She reports that she does not currently use alcohol. She reports that she does not use drugs.  Allergies  Allergen Reactions   Bee Venom Anaphylaxis   Amoxicillin Hives   Augmentin [Amoxicillin-Pot Clavulanate] Hives   Ceclor [Cefaclor] Hives   Penicillins Hives    Has patient had a PCN reaction causing immediate rash, facial/tongue/throat swelling, SOB or lightheadedness with hypotension: Yes Has patient had a PCN reaction  causing severe rash involving mucus membranes or skin necrosis: No Has patient had a PCN reaction that required hospitalization No Has patient had a PCN reaction occurring within the last 10 years: No If all of the above answers are "NO", then may proceed with Cephalosporin use.    Sulfa Antibiotics Hives   Tamiflu [Oseltamivir]    Tomato Hives   Other Rash    Paper tape causes rash   Oxycodone Itching    Family History  Problem  Relation Age of Onset   Anemia Mother    Hypertension Mother    Other Mother        hidradenitis   Epilepsy Father    Hypertension Father    Other Sister        hidradenitis   Epilepsy Brother    Other Paternal Grandmother        aneursym   Cancer Paternal Grandfather    Hypertension Paternal Grandfather     Prior to Admission medications   Medication Sig Start Date End Date Taking? Authorizing Provider  albuterol (VENTOLIN HFA) 108 (90 Base) MCG/ACT inhaler Inhale into the lungs. 04/20/22   [provider]  dapsone 25 MG tablet Take 25 mg by mouth daily. 11/21/22 11/16/23  [provider]  doxycycline (VIBRAMYCIN) 100 MG capsule Take 100 mg by mouth 2 (two) times daily. 04/22/22   [provider]  ferrous sulfate 325 (65 FE) MG tablet Take 1 tablet (325 mg total) by mouth every other day. Patient not taking: Reported on 08/24/2022 04/27/21   Cheral Marker, CNM  gabapentin (NEURONTIN) 100 MG capsule Take 100 mg by mouth 3 (three) times daily.    [provider]  HUMIRA PEN 40 MG/0.4ML PNKT SMARTSIG:40 Milligram(s) SUB-Q Once a Week 05/25/22   [provider]  HYDROcodone-acetaminophen (NORCO/VICODIN) 5-325 MG tablet Take 1-2 tablets by mouth every 4 (four) hours as needed for moderate pain. Patient not taking: Reported on 07/20/2021 07/14/21   Cheral Marker, CNM  hydrocortisone 2.5 % cream Apply 1 Application topically. 01/12/23   [provider]  ibuprofen (ADVIL) 600 MG tablet Take 1 tablet (600 mg total) by mouth every 6 (six) hours as needed for mild pain, moderate pain or cramping. 07/14/21   Cheral Marker, CNM  lidocaine (XYLOCAINE) 2 % jelly Apply 1 Application topically daily. 07/25/23   [provider]  metroNIDAZOLE (FLAGYL) 500 MG tablet Take 500 mg by mouth. 08/05/23 08/19/23  [provider]  moxifloxacin (AVELOX) 400 MG tablet Take 400 mg by mouth. 08/05/23 08/19/23  [provider]   Multiple Vitamins-Minerals (MULTIVITAMIN GUMMIES ADULT PO) Take by mouth.    [provider]  rifampin (RIFADIN) 300 MG capsule Take 300 mg by mouth 2 (two) times daily. 08/05/23 08/19/23  [provider]  silver sulfADIAZINE (SILVADENE) 1 % cream Apply 1 Application topically daily. 01/12/23   [provider]  spironolactone (ALDACTONE) 25 MG tablet Take 25 mg by mouth daily. 11/17/22 05/08/24  [provider]  triamcinolone cream (KENALOG) 0.1 % Apply 1 Application topically daily. 01/12/23   [provider]    Physical Exam: Vitals:   08/05/23 1911 08/05/23 1945 08/05/23 2100 08/05/23 2108  BP:  109/67 111/69 111/69  Pulse:  (!) 106 (!) 109 (!) 120  Resp:  13 20 (!) 22  Temp:    99.2 F (37.3 C)  TempSrc:    Oral  SpO2:  100% 100% 100%  Weight: 110.2 kg  Height: 5' (1.524 m)       Physical Exam Vitals reviewed.  Constitutional:      General: She is not in acute distress. HENT:     Head: Normocephalic and atraumatic.     Mouth/Throat:     Mouth: Mucous membranes are dry.  Eyes:     Extraocular Movements: Extraocular movements intact.  Cardiovascular:     Rate and Rhythm: Normal rate and regular rhythm.     Pulses: Normal pulses.  Pulmonary:     Effort: Pulmonary effort is normal. No respiratory distress.     Breath sounds: No rales.     Comments: Mild scattered wheezing Abdominal:     General: Bowel sounds are normal. There is no distension.     Palpations: Abdomen is soft.     Tenderness: There is no abdominal tenderness.  Musculoskeletal:     Cervical back: Normal range of motion.     Right lower leg: No edema.     Left lower leg: No edema.  Skin:    Comments: Chaperone present at bedside (ED RN) Left gluteal fold/perineum with multiple hidradenitis suppurativa lesions with purulent drainage.  Right upper inner thigh with healing surgical wound without any drainage or surrounding erythema.  Neurological:     Mental  Status: She is alert.     Labs on Admission: I have personally reviewed following labs and imaging studies  CBC: Recent Labs  Lab 08/05/23 1817  WBC 15.2*  NEUTROABS 11.1*  HGB 13.2  HCT 38.7  MCV 98.5  PLT 326   Basic Metabolic Panel: Recent Labs  Lab 08/05/23 1817  NA 132*  K 3.6  CL 95*  CO2 23  GLUCOSE 91  BUN <5*  CREATININE 0.81  CALCIUM 8.7*   GFR: Estimated Creatinine Clearance: 115.5 mL/min (by C-G formula based on SCr of 0.81 mg/dL). Liver Function Tests: Recent Labs  Lab 08/05/23 1817  AST 24  ALT 19  ALKPHOS 97  BILITOT 0.7  PROT 7.5  ALBUMIN 3.0*   No results for input(s): "LIPASE", "AMYLASE" in the last 168 hours. No results for input(s): "AMMONIA" in the last 168 hours. Coagulation Profile: Recent Labs  Lab 08/05/23 1817  INR 1.1   Cardiac Enzymes: No results for input(s): "CKTOTAL", "CKMB", "CKMBINDEX", "TROPONINI" in the last 168 hours. BNP (last 3 results) No results for input(s): "PROBNP" in the last 8760 hours. HbA1C: No results for input(s): "HGBA1C" in the last 72 hours. CBG: No results for input(s): "GLUCAP" in the last 168 hours. Lipid Profile: No results for input(s): "CHOL", "HDL", "LDLCALC", "TRIG", "CHOLHDL", "LDLDIRECT" in the last 72 hours. Thyroid Function Tests: No results for input(s): "TSH", "T4TOTAL", "FREET4", "T3FREE", "THYROIDAB" in the last 72 hours. Anemia Panel: No results for input(s): "VITAMINB12", "FOLATE", "FERRITIN", "TIBC", "IRON", "RETICCTPCT" in the last 72 hours. Urine analysis:    Component Value Date/Time   COLORURINE YELLOW 08/05/2023 1813   APPEARANCEUR HAZY (A) 08/05/2023 1813   APPEARANCEUR Clear 04/30/2019 1628   LABSPEC 1.009 08/05/2023 1813   PHURINE 6.0 08/05/2023 1813   GLUCOSEU NEGATIVE 08/05/2023 1813   HGBUR SMALL (A) 08/05/2023 1813   BILIRUBINUR NEGATIVE 08/05/2023 1813   BILIRUBINUR Negative 04/30/2019 1628   KETONESUR NEGATIVE 08/05/2023 1813   PROTEINUR NEGATIVE  08/05/2023 1813   UROBILINOGEN 0.2 06/18/2015 1927   NITRITE NEGATIVE 08/05/2023 1813   LEUKOCYTESUR SMALL (A) 08/05/2023 1813    Radiological Exams on Admission: CT ABDOMEN PELVIS W CONTRAST  Result Date: 08/05/2023 CLINICAL DATA:  Abdominal  pain. Groin abscess. Pain and swelling LEFT leg EXAM: CT ABDOMEN AND PELVIS WITH CONTRAST TECHNIQUE: Multidetector CT imaging of the abdomen and pelvis was performed using the standard protocol following bolus administration of intravenous contrast. RADIATION DOSE REDUCTION: This exam was performed according to the departmental dose-optimization program which includes automated exposure control, adjustment of the mA and/or kV according to patient size and/or use of iterative reconstruction technique. CONTRAST:  75mL OMNIPAQUE IOHEXOL 350 MG/ML SOLN COMPARISON:  None Available. FINDINGS: Lower chest: Lung bases are clear. Hepatobiliary: No focal hepatic lesion. Normal gallbladder. No biliary duct dilatation. Common bile duct is normal. Pancreas: Pancreas is normal. No ductal dilatation. No pancreatic inflammation. Spleen: Normal spleen Adrenals/urinary tract: Adrenal glands and kidneys are normal. The ureters and bladder normal. Stomach/Bowel: Stomach, small bowel, appendix, and cecum are normal. The colon and rectosigmoid colon are normal. Vascular/Lymphatic: Abdominal aorta is normal caliber. No periportal or retroperitoneal adenopathy. No pelvic adenopathy. Reproductive: IUD in expected location the uterus.  Ovaries normal. Other: No inguinal adenopathy or infection identified. Musculoskeletal: No aggressive osseous lesion. IMPRESSION: 1. No acute findings in the abdomen pelvis. 2. Normal appendix. 3. No inguinal adenopathy or infection identified. 4. IUD in expected location the uterus. Electronically Signed   By: Genevive Bi M.D.   On: 08/05/2023 21:24    EKG: Independently reviewed. Sinus tachycardia, ST elevations in inferior leads.  No previous tracing  for comparison.  Assessment and Plan  Hidradenitis suppurativa with infected lesions in the left gluteal fold/perineum SIRS/possible sepsis Failed outpatient antibiotic therapy.  CT without evidence of abscess.  Meets SIRS criteria with tachycardia and leukocytosis.  No lactic acidosis or hypotension to suggest severe sepsis.  Patient was given 3.5 L IV fluid boluses in the ED per sepsis protocol and still slightly tachycardic.  Continue IV fluid hydration, vancomycin, and ceftriaxone.  Continue pain management.  Blood cultures pending.  Continue to monitor WBC count.  Wound care consulted.  Mild hyponatremia She received IV fluids.  Repeat labs in the morning.  Asthma She has mild scattered wheezing on exam but no shortness of breath or respiratory distress.  Xopenex neb as needed.  Abnormal EKG EKG showing ST elevations in inferior leads, likely early repolarization abnormality.  No previous tracing for comparison.  ACS less likely given age, no significant risk factors, and patient is not endorsing chest pain.  Check troponin.  DVT prophylaxis: Lovenox Code Status: Full Code (discussed with the patient) Family Communication: No family available at this time. Consults called: Wound care Level of care: Telemetry bed Admission status: It is my clinical opinion that admission to INPATIENT is reasonable and necessary because of the expectation that this patient will require hospital care that crosses at least 2 midnights to treat this condition based on the medical complexity of the problems presented.  Given the aforementioned information, the predictability of an adverse outcome is felt to be significant.  John Giovanni MD [facility]  If 7PM-7AM, please contact night-coverage [email or web address]  08/05/2023, 9:50 PM

## 2023-08-05 NOTE — ED Notes (Signed)
ED TO INPATIENT HANDOFF REPORT  ED Nurse Name and Phone #: Pearletha Forge RN [phone]  S Name/Age/Gender Loretta Moore 27 y.o. female Room/Bed: 004C/004C  Code Status   Code Status: Prior  Home/SNF/Other Home Patient oriented to: self, place, time, and situation Is this baseline? Yes   Triage Complete: Triage complete  Chief Complaint Cellulitis [L03.90]  Triage Note Pt has hx of hidradenitis suppurativa. Reports recent surgery on right leg but now she is having pain and swelling to her left leg. Pt also reports drainage from her c section scar for the past 2 weeks. Pt sent here from [facility] for septic workup    Allergies Allergies  Allergen Reactions   Bee Venom Anaphylaxis   Amoxicillin Hives   Augmentin [Amoxicillin-Pot Clavulanate] Hives   Ceclor [Cefaclor] Hives   Penicillins Hives    Has patient had a PCN reaction causing immediate rash, facial/tongue/throat swelling, SOB or lightheadedness with hypotension: Yes Has patient had a PCN reaction causing severe rash involving mucus membranes or skin necrosis: No Has patient had a PCN reaction that required hospitalization No Has patient had a PCN reaction occurring within the last 10 years: No If all of the above answers are "NO", then may proceed with Cephalosporin use.    Sulfa Antibiotics Hives   Tomato Hives   Other Rash    Paper tape causes rash   Tamiflu [Oseltamivir] Swelling and Rash    Facial swelling and all over body rash    Level of Care/Admitting Diagnosis ED Disposition     ED Disposition  Admit   Condition  --   Comment  Hospital Area: [facility] [[id number]]  Level of Care: Telemetry Medical [104]  May admit patient to [facility] or [facility] if equivalent level of care is available:: Yes  Covid Evaluation: Asymptomatic - no recent exposure (last 10 days) testing not required  Diagnosis: Cellulitis [[id number]]  Admitting Physician: John Giovanni [[id number]]  Attending Physician:  John Giovanni [[id number]]  Certification:: I certify this patient will need inpatient services for at least 2 midnights  Expected Medical Readiness: 08/07/2023          B Medical/Surgery History Past Medical History:  Diagnosis Date   Anemia    Asthma    Hard of hearing    Hidradenitis suppurativa    had excisions in axilla and groin   Past Surgical History:  Procedure Laterality Date   AXILLARY HIDRADENITIS EXCISION     CESAREAN SECTION N/A 08/29/2015   Procedure: CESAREAN SECTION;  Surgeon: Levie Heritage, DO;  Location: [facility];  Service: Obstetrics;  Laterality: N/A;   CESAREAN SECTION N/A 10/26/2019   Procedure: REPEAT CESAREAN SECTION;  Surgeon: Lazaro Arms, MD;  Location: [facility];  Service: Obstetrics;  Laterality: N/A;   CESAREAN SECTION N/A 07/12/2021   Procedure: CESAREAN SECTION;  Surgeon: Venora Maples, MD;  Location: [facility];  Service: Obstetrics;  Laterality: N/A;   HYDRADENITIS EXCISION Bilateral 01/29/2019   Procedure: Excision Hidradentitis groin (right) and right and left flank;  Surgeon: Harriette Bouillon, MD;  Location: [facility];  Service: General;  Laterality: Bilateral;   INGUINAL HIDRADENITIS EXCISION     INTRAUTERINE DEVICE (IUD) INSERTION N/A 07/12/2021   Procedure: INTRAUTERINE DEVICE (IUD) INSERTION;  Surgeon: Venora Maples, MD;  Location: [facility];  Service: Obstetrics;  Laterality: N/A;   TONSILLECTOMY AND ADENOIDECTOMY       A IV Location/Drains/Wounds Patient Lines/Drains/Airways Status  Active Line/Drains/Airways     Name Placement date Placement time Site Days   Peripheral IV 08/05/23 20 G Anterior;Left Forearm 08/05/23  2005  Forearm  less than 1   Peripheral IV 08/05/23 20 G Left;Posterior Hand 08/05/23  2018  Hand  less than 1   Negative Pressure Wound Therapy Abdomen Lower;Medial 07/12/21  1450  --  754   Incision (Closed) 10/26/19 Abdomen 10/26/19  0829  -- 1379   Incision (Closed) 07/12/21 Vagina  Other (Comment) 07/12/21  1230  -- 754   Incision (Closed) 07/12/21 Abdomen Other (Comment) 07/12/21  1230  -- 754            Intake/Output Last 24 hours No intake or output data in the 24 hours ending 08/05/23 2212  Labs/Imaging Results for orders placed or performed during the hospital encounter of 08/05/23 (from the past 48 hour(s))  Urinalysis, w/ Reflex to Culture (Infection Suspected) -Urine, Clean Catch     Status: Abnormal   Collection Time: 08/05/23  6:13 PM  Result Value Ref Range   Specimen Source URINE, CATHETERIZED    Color, Urine YELLOW YELLOW   APPearance HAZY (A) CLEAR   Specific Gravity, Urine 1.009 1.005 - 1.030   pH 6.0 5.0 - 8.0   Glucose, UA NEGATIVE NEGATIVE mg/dL   Hgb urine dipstick SMALL (A) NEGATIVE   Bilirubin Urine NEGATIVE NEGATIVE   Ketones, ur NEGATIVE NEGATIVE mg/dL   Protein, ur NEGATIVE NEGATIVE mg/dL   Nitrite NEGATIVE NEGATIVE   Leukocytes,Ua SMALL (A) NEGATIVE   RBC / HPF 0-5 0 - 5 RBC/hpf   WBC, UA 0-5 0 - 5 WBC/hpf    Comment:        Reflex urine culture not performed if WBC <=10, OR if Squamous epithelial cells >5. If Squamous epithelial cells >5 suggest recollection.    Bacteria, UA NONE SEEN NONE SEEN   Squamous Epithelial / HPF 6-10 0 - 5 /HPF    Comment: Performed at [facility], [address] [place], [place] [address]  Comprehensive metabolic panel     Status: Abnormal   Collection Time: 08/05/23  6:17 PM  Result Value Ref Range   Sodium 132 (L) 135 - 145 mmol/L   Potassium 3.6 3.5 - 5.1 mmol/L   Chloride 95 (L) 98 - 111 mmol/L   CO2 23 22 - 32 mmol/L   Glucose, Bld 91 70 - 99 mg/dL    Comment: Glucose reference range applies only to samples taken after fasting for at least 8 hours.   BUN <5 (L) 6 - 20 mg/dL   Creatinine, Ser 1.91 0.44 - 1.00 mg/dL   Calcium 8.7 (L) 8.9 - 10.3 mg/dL   Total Protein 7.5 6.5 - 8.1 g/dL   Albumin 3.0 (L) 3.5 - 5.0 g/dL   AST 24 15 - 41 U/L   ALT 19 0 - 44 U/L   Alkaline  Phosphatase 97 38 - 126 U/L   Total Bilirubin 0.7 0.3 - 1.2 mg/dL   GFR, Estimated >47 >82 mL/min    Comment: (NOTE) Calculated using the CKD-EPI Creatinine Equation (2021)    Anion gap 14 5 - 15    Comment: Performed at [facility], [address], [place], [place] [address]  CBC with Differential     Status: Abnormal   Collection Time: 08/05/23  6:17 PM  Result Value Ref Range   WBC 15.2 (H) 4.0 - 10.5 K/uL   RBC 3.93 3.87 - 5.11 MIL/uL  Hemoglobin 13.2 12.0 - 15.0 g/dL   HCT 40.9 81.1 - 91.4 %   MCV 98.5 80.0 - 100.0 fL   MCH 33.6 26.0 - 34.0 pg   MCHC 34.1 30.0 - 36.0 g/dL   RDW 78.2 (H) 95.6 - 21.3 %   Platelets 326 150 - 400 K/uL   nRBC 0.0 0.0 - 0.2 %   Neutrophils Relative % 75 %   Neutro Abs 11.1 (H) 1.7 - 7.7 K/uL   Lymphocytes Relative 18 %   Lymphs Abs 2.8 0.7 - 4.0 K/uL   Monocytes Relative 7 %   Monocytes Absolute 1.1 (H) 0.1 - 1.0 K/uL   Eosinophils Relative 0 %   Eosinophils Absolute 0.0 0.0 - 0.5 K/uL   Basophils Relative 0 %   Basophils Absolute 0.1 0.0 - 0.1 K/uL   Immature Granulocytes 0 %   Abs Immature Granulocytes 0.06 0.00 - 0.07 K/uL    Comment: Performed at [facility], [address] [place], [place] [address]  Protime-INR     Status: None   Collection Time: 08/05/23  6:17 PM  Result Value Ref Range   Prothrombin Time 14.5 11.4 - 15.2 seconds   INR 1.1 0.8 - 1.2    Comment: (NOTE) INR goal varies based on device and disease states. Performed at [facility], [address] [place], [place] [address]   hCG, serum, qualitative     Status: None   Collection Time: 08/05/23  6:17 PM  Result Value Ref Range   Preg, Serum NEGATIVE NEGATIVE    Comment:        THE SENSITIVITY OF THIS METHODOLOGY IS >10 mIU/mL. Performed at [facility], [address] [place], [place] [address]   I-Stat Lactic Acid, ED     Status: None   Collection Time: 08/05/23  6:41 PM  Result Value Ref Range   Lactic Acid, Venous 0.9 0.5 - 1.9  mmol/L  Resp panel by RT-PCR (RSV, Flu A&B, Covid) Anterior Nasal Swab     Status: None   Collection Time: 08/05/23  8:03 PM   Specimen: Anterior Nasal Swab  Result Value Ref Range   SARS Coronavirus 2 by RT PCR NEGATIVE NEGATIVE   Influenza A by PCR NEGATIVE NEGATIVE   Influenza B by PCR NEGATIVE NEGATIVE    Comment: (NOTE) The Xpert Xpress SARS-CoV-2/FLU/RSV plus assay is intended as an aid in the diagnosis of influenza from Nasopharyngeal swab specimens and should not be used as a sole basis for treatment. Nasal washings and aspirates are unacceptable for Xpert Xpress SARS-CoV-2/FLU/RSV testing.  Fact Sheet for Patients: BloggerCourse.com  Fact Sheet for Healthcare Providers: SeriousBroker.it  This test is not yet approved or cleared by the [place] FDA and has been authorized for detection and/or diagnosis of SARS-CoV-2 by FDA under an Emergency Use Authorization (EUA). This EUA will remain in effect (meaning this test can be used) for the duration of the COVID-19 declaration under Section 564(b)(1) of the Act, 21 U.S.C. section 360bbb-3(b)(1), unless the authorization is terminated or revoked.     Resp Syncytial Virus by PCR NEGATIVE NEGATIVE    Comment: (NOTE) Fact Sheet for Patients: BloggerCourse.com  Fact Sheet for Healthcare Providers: SeriousBroker.it  This test is not yet approved or cleared by the [place] FDA and has been authorized for detection and/or diagnosis of SARS-CoV-2 by FDA under an Emergency Use Authorization (EUA). This EUA will remain in effect (meaning this test can be used) for  the duration of the COVID-19 declaration under Section 564(b)(1) of the Act, 21 U.S.C. section 360bbb-3(b)(1), unless the authorization is terminated or revoked.  Performed at [facility], [address] [place], [place] [address]   APTT     Status:  Abnormal   Collection Time: 08/05/23  8:04 PM  Result Value Ref Range   aPTT 21 (L) 24 - 36 seconds    Comment: Performed at [facility], [address] [place], [place] [address]  I-Stat Lactic Acid, ED     Status: None   Collection Time: 08/05/23  8:09 PM  Result Value Ref Range   Lactic Acid, Venous 1.1 0.5 - 1.9 mmol/L   CT ABDOMEN PELVIS W CONTRAST  Result Date: 08/05/2023 CLINICAL DATA:  Abdominal pain. Groin abscess. Pain and swelling LEFT leg EXAM: CT ABDOMEN AND PELVIS WITH CONTRAST TECHNIQUE: Multidetector CT imaging of the abdomen and pelvis was performed using the standard protocol following bolus administration of intravenous contrast. RADIATION DOSE REDUCTION: This exam was performed according to the departmental dose-optimization program which includes automated exposure control, adjustment of the mA and/or kV according to patient size and/or use of iterative reconstruction technique. CONTRAST:  75mL OMNIPAQUE IOHEXOL 350 MG/ML SOLN COMPARISON:  None Available. FINDINGS: Lower chest: Lung bases are clear. Hepatobiliary: No focal hepatic lesion. Normal gallbladder. No biliary duct dilatation. Common bile duct is normal. Pancreas: Pancreas is normal. No ductal dilatation. No pancreatic inflammation. Spleen: Normal spleen Adrenals/urinary tract: Adrenal glands and kidneys are normal. The ureters and bladder normal. Stomach/Bowel: Stomach, small bowel, appendix, and cecum are normal. The colon and rectosigmoid colon are normal. Vascular/Lymphatic: Abdominal aorta is normal caliber. No periportal or retroperitoneal adenopathy. No pelvic adenopathy. Reproductive: IUD in expected location the uterus.  Ovaries normal. Other: No inguinal adenopathy or infection identified. Musculoskeletal: No aggressive osseous lesion. IMPRESSION: 1. No acute findings in the abdomen pelvis. 2. Normal appendix. 3. No inguinal adenopathy or infection identified. 4. IUD in expected location the uterus.  Electronically Signed   By: Genevive Bi M.D.   On: 08/05/2023 21:24    Pending Labs Unresulted Labs (From admission, onward)     Start     Ordered   08/05/23 1813  Culture, blood (Routine x 2)  BLOOD CULTURE X 2,   R (with STAT occurrences)      08/05/23 1813            Vitals/Pain Today's Vitals   08/05/23 1911 08/05/23 1945 08/05/23 2100 08/05/23 2108  BP:  109/67 111/69 111/69  Pulse:  (!) 106 (!) 109 (!) 120  Resp:  13 20 (!) 22  Temp:    99.2 F (37.3 C)  TempSrc:    Oral  SpO2:  100% 100% 100%  Weight: 110.2 kg     Height: 5' (1.524 m)     PainSc:    9     Isolation Precautions No active isolations  Medications Medications  lactated ringers infusion (has no administration in time range)  lactated ringers bolus 1,000 mL (0 mLs Intravenous Stopped 08/05/23 2143)    And  lactated ringers bolus 1,000 mL (0 mLs Intravenous Stopped 08/05/23 2143)    And  lactated ringers bolus 1,000 mL (1,000 mLs Intravenous New Bag/Given 08/05/23 2105)    And  lactated ringers bolus 500 mL (500 mLs Intravenous New Bag/Given 08/05/23 2209)  vancomycin (VANCOREADY) IVPB 2000 mg/400 mL (2,000 mg Intravenous New Bag/Given 08/05/23 2104)  cefTRIAXone (ROCEPHIN) 2 g in sodium  chloride 0.9 % 100 mL IVPB (0 g Intravenous Stopped 08/05/23 2104)  morphine (PF) 4 MG/ML injection 4 mg (4 mg Intravenous Given 08/05/23 2100)  ondansetron (ZOFRAN) injection 4 mg (4 mg Intravenous Given 08/05/23 2059)  iohexol (OMNIPAQUE) 350 MG/ML injection 75 mL (75 mLs Intravenous Contrast Given 08/05/23 2043)    Mobility walks     Focused Assessments Patient has HS and came to Er for possible sepsis r/t HS. Has a recent surgical site located on the inner right thigh that is draining, a c section site on the left side of abdomin that is open approx 1 inch, no drainage noted appears small dehiscence, and  on the lower peritoneal area has a yellow/white drainage.     R Recommendations: See Admitting Provider  Note  Report given to:   Additional Notes:

## 2023-08-05 NOTE — Progress Notes (Signed)
ED Pharmacy Antibiotic Sign Off An antibiotic consult was received from an ED provider for vancomycin per pharmacy dosing for cellulitis. A chart review was completed to assess appropriateness.  A single dose of ceftriaxone 2000 mg was placed by the ED provider.   The following one time order(s) were placed per pharmacy consult:  vancomycin 2000 mg x 1 dose  Further antibiotic and/or antibiotic pharmacy consults should be ordered by the admitting provider if indicated.   Thank you for allowing pharmacy to be a part of this patient's care.   Delmar Landau, PharmD, BCPS 08/05/2023 7:21 PM ED Clinical Pharmacist -  [phone]

## 2023-08-05 NOTE — Sepsis Progress Note (Signed)
[facility] following for sepsis protocol. 

## 2023-08-05 NOTE — Discharge Instructions (Addendum)
Discharged to ER for increased workup of possible sepsis.

## 2023-08-05 NOTE — ED Triage Notes (Signed)
Patient here today with c/o abscess on left posterior thigh X 5 days. The area seems to be swelling. She has also had some chills, and dizziness. She has been taking Tylenol with no relief. Her dermatologist recently send an "antibiotic cocktail". She has not picked this up yet. Patient recently has surgery on her right inner thigh for the removed of HS.

## 2023-08-06 DIAGNOSIS — L732 Hidradenitis suppurativa: Secondary | ICD-10-CM | POA: Diagnosis not present

## 2023-08-06 LAB — CBC
HCT: 33.9 % — ABNORMAL LOW (ref 36.0–46.0)
Hemoglobin: 11.3 g/dL — ABNORMAL LOW (ref 12.0–15.0)
MCH: 33.4 pg (ref 26.0–34.0)
MCHC: 33.3 g/dL (ref 30.0–36.0)
MCV: 100.3 fL — ABNORMAL HIGH (ref 80.0–100.0)
Platelets: 275 10*3/uL (ref 150–400)
RBC: 3.38 MIL/uL — ABNORMAL LOW (ref 3.87–5.11)
RDW: 16.8 % — ABNORMAL HIGH (ref 11.5–15.5)
WBC: 11 10*3/uL — ABNORMAL HIGH (ref 4.0–10.5)
nRBC: 0 % (ref 0.0–0.2)

## 2023-08-06 LAB — BASIC METABOLIC PANEL
Anion gap: 10 (ref 5–15)
BUN: 5 mg/dL — ABNORMAL LOW (ref 6–20)
CO2: 24 mmol/L (ref 22–32)
Calcium: 8.3 mg/dL — ABNORMAL LOW (ref 8.9–10.3)
Chloride: 100 mmol/L (ref 98–111)
Creatinine, Ser: 0.69 mg/dL (ref 0.44–1.00)
GFR, Estimated: >60 mL/min (ref >60)
Glucose, Bld: 91 mg/dL (ref 70–99)
Potassium: 3.5 mmol/L (ref 3.5–5.1)
Sodium: 134 mmol/L — ABNORMAL LOW (ref 135–145)

## 2023-08-06 LAB — HIV ANTIBODY (ROUTINE TESTING W REFLEX): HIV Screen 4th Generation wRfx: NONREACTIVE

## 2023-08-06 LAB — TROPONIN I (HIGH SENSITIVITY): Troponin I (High Sensitivity): 4 ng/L (ref <18)

## 2023-08-06 MED ORDER — SILVER SULFADIAZINE 1 % EX CREA
1.0000 | TOPICAL_CREAM | Freq: Every day | CUTANEOUS | Status: DC
  Administered 2023-08-06 – 2023-08-08 (×3): 1 via TOPICAL
  Filled 2023-08-06: qty 85

## 2023-08-06 MED ORDER — METRONIDAZOLE 500 MG/100ML IV SOLN
500.0000 mg | Freq: Once | INTRAVENOUS | Status: AC
  Administered 2023-08-06: 500 mg via INTRAVENOUS
  Filled 2023-08-06: qty 100

## 2023-08-06 MED ORDER — NICOTINE POLACRILEX 2 MG MT GUM
2.0000 mg | CHEWING_GUM | OROMUCOSAL | Status: DC | PRN
  Administered 2023-08-06: 2 mg via ORAL
  Filled 2023-08-06 (×3): qty 1

## 2023-08-06 MED ORDER — METRONIDAZOLE 500 MG/100ML IV SOLN
500.0000 mg | Freq: Two times a day (BID) | INTRAVENOUS | Status: DC
  Administered 2023-08-06 – 2023-08-08 (×5): 500 mg via INTRAVENOUS
  Filled 2023-08-06 (×5): qty 100

## 2023-08-06 MED ORDER — OXYCODONE HCL 5 MG PO TABS
5.0000 mg | ORAL_TABLET | ORAL | Status: DC | PRN
  Administered 2023-08-06 – 2023-08-08 (×8): 10 mg via ORAL
  Filled 2023-08-06 (×9): qty 2

## 2023-08-06 MED ORDER — LACTATED RINGERS IV SOLN: INTRAVENOUS | Status: AC

## 2023-08-06 MED ORDER — VANCOMYCIN HCL 750 MG/150ML IV SOLN
750.0000 mg | Freq: Two times a day (BID) | INTRAVENOUS | Status: DC
  Administered 2023-08-06 – 2023-08-08 (×5): 750 mg via INTRAVENOUS
  Filled 2023-08-06 (×6): qty 150

## 2023-08-06 MED ORDER — NICOTINE 21 MG/24HR TD PT24
21.0000 mg | MEDICATED_PATCH | Freq: Every day | TRANSDERMAL | Status: DC
  Administered 2023-08-06 – 2023-08-08 (×3): 21 mg via TRANSDERMAL
  Filled 2023-08-06 (×3): qty 1

## 2023-08-06 MED ORDER — NICOTINE 7 MG/24HR TD PT24
7.0000 mg | MEDICATED_PATCH | Freq: Every day | TRANSDERMAL | Status: DC
  Administered 2023-08-06: 7 mg via TRANSDERMAL
  Filled 2023-08-06: qty 1

## 2023-08-06 MED ORDER — SODIUM CHLORIDE 0.9 % IV SOLN
1.0000 g | Freq: Three times a day (TID) | INTRAVENOUS | Status: DC
  Administered 2023-08-06 – 2023-08-08 (×7): 1 g via INTRAVENOUS
  Filled 2023-08-06 (×9): qty 5

## 2023-08-06 MED ORDER — LIDOCAINE 4 % EX CREA
1.0000 | TOPICAL_CREAM | CUTANEOUS | Status: DC | PRN
  Filled 2023-08-06: qty 5

## 2023-08-06 MED ORDER — SPIRONOLACTONE 25 MG PO TABS
25.0000 mg | ORAL_TABLET | Freq: Every day | ORAL | Status: DC
  Administered 2023-08-06 – 2023-08-08 (×3): 25 mg via ORAL
  Filled 2023-08-06 (×3): qty 1

## 2023-08-06 MED ORDER — ALBUTEROL SULFATE (2.5 MG/3ML) 0.083% IN NEBU: 2.5000 mg | INHALATION_SOLUTION | Freq: Four times a day (QID) | RESPIRATORY_TRACT | Status: DC | PRN

## 2023-08-06 MED ORDER — GABAPENTIN 100 MG PO CAPS
200.0000 mg | ORAL_CAPSULE | Freq: Every day | ORAL | Status: DC
  Administered 2023-08-06 – 2023-08-07 (×2): 200 mg via ORAL
  Filled 2023-08-06 (×2): qty 2

## 2023-08-06 MED ORDER — TRIAMCINOLONE ACETONIDE 0.1 % EX CREA
1.0000 | TOPICAL_CREAM | Freq: Every day | CUTANEOUS | Status: DC
  Administered 2023-08-06 – 2023-08-08 (×3): 1 via TOPICAL
  Filled 2023-08-06: qty 15

## 2023-08-06 NOTE — Progress Notes (Signed)
Pharmacy Antibiotic Note  Loretta Moore is a 27 y.o. female admitted on 08/05/2023 with infected groin lesions associated w/ hidradenitis suppurativa that failed outpatient treatment.  Pharmacy has been consulted for vancomycin and aztreonam dosing.  Pt has documented cephalosporin allergy w/ reaction of hives; rec'd dose of ceftriaxone in ED > pt reported itching that self-resolved within a few hours.  Plan: Vancomycin 2000mg  x1 then 750mg  IV Q12H. Goal AUC 400-550.  Expected AUC 415. Aztreonam 1g IV Q8H.  Height: 5' (152.4 cm) Weight: 110.2 kg (242 lb 15.2 oz) IBW/kg (Calculated) : 45.5  Temp (24hrs), Avg:99.1 F (37.3 C), Min:98.3 F (36.8 C), Max:99.7 F (37.6 C)  Recent Labs  Lab 08/05/23 1817 08/05/23 1841 08/05/23 2009  WBC 15.2*  --   --   CREATININE 0.81  --   --   LATICACIDVEN  --  0.9 1.1    Estimated Creatinine Clearance: 115.5 mL/min (by C-G formula based on SCr of 0.81 mg/dL).    Allergies  Allergen Reactions   Bee Venom Anaphylaxis   Amoxicillin Hives   Augmentin [Amoxicillin-Pot Clavulanate] Hives   Ceclor [Cefaclor] Hives   Penicillins Hives    Has patient had a PCN reaction causing immediate rash, facial/tongue/throat swelling, SOB or lightheadedness with hypotension: Yes Has patient had a PCN reaction causing severe rash involving mucus membranes or skin necrosis: No Has patient had a PCN reaction that required hospitalization No Has patient had a PCN reaction occurring within the last 10 years: No If all of the above answers are "NO", then may proceed with Cephalosporin use.    Sulfa Antibiotics Hives   Tomato Hives   Other Rash    Paper tape causes rash   Tamiflu [Oseltamivir] Swelling and Rash    Facial swelling and all over body rash    Thank you for allowing pharmacy to be a part of this patient's care.  Vernard Gambles, PharmD, BCPS  08/06/2023 12:53 AM

## 2023-08-06 NOTE — Plan of Care (Signed)

## 2023-08-06 NOTE — Consult Note (Signed)
Consulting Physician: Hyman Hopes Artrell Lawless  Referring Provider: Dr. Elvera Lennox  Chief Complaint: Hidradenitis Suppurativa flair  Reason for Consult: Hidradenitis Suppurtiva possible abscess drainage   Subjective   HPI: Loretta Moore is an 27 y.o. female who is here for a hidradenitis suppurtiva flair.  Has dealt with this her whole life.  Smokes cigarettes.  Recently right groin procedure, now drainage from left groin.   Past Medical History:  Diagnosis Date   Anemia    Asthma    Hard of hearing    Hidradenitis suppurativa    had excisions in axilla and groin    Past Surgical History:  Procedure Laterality Date   AXILLARY HIDRADENITIS EXCISION     CESAREAN SECTION N/A 08/29/2015   Procedure: CESAREAN SECTION;  Surgeon: Levie Heritage, DO;  Location: [facility];  Service: Obstetrics;  Laterality: N/A;   CESAREAN SECTION N/A 10/26/2019   Procedure: REPEAT CESAREAN SECTION;  Surgeon: Lazaro Arms, MD;  Location: [facility];  Service: Obstetrics;  Laterality: N/A;   CESAREAN SECTION N/A 07/12/2021   Procedure: CESAREAN SECTION;  Surgeon: Venora Maples, MD;  Location: [facility];  Service: Obstetrics;  Laterality: N/A;   HYDRADENITIS EXCISION Bilateral 01/29/2019   Procedure: Excision Hidradentitis groin (right) and right and left flank;  Surgeon: Harriette Bouillon, MD;  Location: [facility];  Service: General;  Laterality: Bilateral;   INGUINAL HIDRADENITIS EXCISION     INTRAUTERINE DEVICE (IUD) INSERTION N/A 07/12/2021   Procedure: INTRAUTERINE DEVICE (IUD) INSERTION;  Surgeon: Venora Maples, MD;  Location: [facility];  Service: Obstetrics;  Laterality: N/A;   TONSILLECTOMY AND ADENOIDECTOMY      Family History  Problem Relation Age of Onset   Anemia Mother    Hypertension Mother    Other Mother        hidradenitis   Epilepsy Father    Hypertension Father    Other Sister        hidradenitis   Epilepsy Brother    Other Paternal Grandmother        aneursym    Cancer Paternal Grandfather    Hypertension Paternal Grandfather     Social:  reports that she has been smoking cigarettes. She started smoking about 11 years ago. She has a 1.5 pack-year smoking history. She has never used smokeless tobacco. She reports that she does not currently use alcohol. She reports that she does not use drugs.  Allergies:  Allergies  Allergen Reactions   Bee Venom Anaphylaxis   Amoxicillin Hives   Augmentin [Amoxicillin-Pot Clavulanate] Hives   Ceclor [Cefaclor] Hives   Penicillins Hives    Has patient had a PCN reaction causing immediate rash, facial/tongue/throat swelling, SOB or lightheadedness with hypotension: Yes Has patient had a PCN reaction causing severe rash involving mucus membranes or skin necrosis: No Has patient had a PCN reaction that required hospitalization No Has patient had a PCN reaction occurring within the last 10 years: No If all of the above answers are "NO", then may proceed with Cephalosporin use.    Sulfa Antibiotics Hives   Tomato Hives   Other Rash    Paper tape causes rash   Tamiflu [Oseltamivir] Swelling and Rash    Facial swelling and all over body rash    Medications: Current Outpatient Medications  Medication Instructions   acetaminophen (TYLENOL) 1,000 mg, Oral, Every 6 hours PRN   albuterol (VENTOLIN HFA) 108 (90 Base) MCG/ACT inhaler 1-2 puffs, Inhalation, Every 6 hours PRN  doxycycline (VIBRAMYCIN) 100 mg, Oral, 2 times daily   gabapentin (NEURONTIN) 200 mg, Oral, Daily at bedtime   Humira (2 Pen) 40 mg, Subcutaneous, Every Mon   lidocaine (LMX) 4 % cream 1 Application, Topical, As needed   metroNIDAZOLE (FLAGYL) 500 mg   moxifloxacin (AVELOX) 400 mg   Multiple Vitamins-Minerals (MULTIVITAMIN GUMMIES ADULT PO) 2 tablets, Oral, Daily   rifampin (RIFADIN) 300 mg, 2 times daily   silver sulfADIAZINE (SILVADENE) 1 % cream 1 Application, Topical, Daily   spironolactone (ALDACTONE) 25 mg, Oral, Daily    triamcinolone cream (KENALOG) 0.1 % 1 Application, Topical, Daily    ROS - all of the below systems have been reviewed with the patient and positives are indicated with bold text General: chills, fever or night sweats Eyes: blurry vision or double vision ENT: epistaxis or sore throat Allergy/Immunology: itchy/watery eyes or nasal congestion Hematologic/Lymphatic: bleeding problems, blood clots or swollen lymph nodes Endocrine: temperature intolerance or unexpected weight changes Breast: new or changing breast lumps or nipple discharge Resp: cough, shortness of breath, or wheezing CV: chest pain or dyspnea on exertion GI: as per HPI GU: dysuria, trouble voiding, or hematuria MSK: joint pain or joint stiffness Neuro: TIA or stroke symptoms Derm: pruritus and skin lesion changes Psych: anxiety and depression  Objective   PE Blood pressure 138/77, pulse (!) 51, temperature 98.2 F (36.8 C), temperature source Oral, resp. rate 18, height 5' (1.524 m), weight 110.2 kg, last menstrual period 07/20/2023, SpO2 97%. Constitutional: NAD; conversant; no deformities Eyes: Moist conjunctiva; no lid lag; anicteric; PERRL Neck: Trachea midline; no thyromegaly Lungs: Normal respiratory effort; no tactile fremitus CV: RRR; no palpable thrills; no pitting edema GI: Abd Soft, nontender; no palpable hepatosplenomegaly MSK: Normal range of motion of extremities; no clubbing/cyanosis Psychiatric: Appropriate affect; alert and oriented x3 Lymphatic: No palpable cervical or axillary lymphadenopathy  Left groin - chronic HS changes with weaping drainage and no clear area of fluctuance to drain.    Right groin -recent HS resection still healing  Results for orders placed or performed during the hospital encounter of 08/05/23 (from the past 24 hour(s))  Culture, blood (Routine x 2)     Status: None (Preliminary result)   Collection Time: 08/05/23  6:13 PM   Specimen: BLOOD  Result Value Ref Range    Specimen Description BLOOD RIGHT ANTECUBITAL    Special Requests      BOTTLES DRAWN AEROBIC AND ANAEROBIC Blood Culture adequate volume   Culture      NO GROWTH < 12 HOURS Performed at [facility], [address], [place], [place] [address]    Report Status PENDING   Urinalysis, w/ Reflex to Culture (Infection Suspected) -Urine, Clean Catch     Status: Abnormal   Collection Time: 08/05/23  6:13 PM  Result Value Ref Range   Specimen Source URINE, CATHETERIZED    Color, Urine YELLOW YELLOW   APPearance HAZY (A) CLEAR   Specific Gravity, Urine 1.009 1.005 - 1.030   pH 6.0 5.0 - 8.0   Glucose, UA NEGATIVE NEGATIVE mg/dL   Hgb urine dipstick SMALL (A) NEGATIVE   Bilirubin Urine NEGATIVE NEGATIVE   Ketones, ur NEGATIVE NEGATIVE mg/dL   Protein, ur NEGATIVE NEGATIVE mg/dL   Nitrite NEGATIVE NEGATIVE   Leukocytes,Ua SMALL (A) NEGATIVE   RBC / HPF 0-5 0 - 5 RBC/hpf   WBC, UA 0-5 0 - 5 WBC/hpf   Bacteria, UA NONE SEEN NONE SEEN   Squamous Epithelial / HPF  6-10 0 - 5 /HPF  Comprehensive metabolic panel     Status: Abnormal   Collection Time: 08/05/23  6:17 PM  Result Value Ref Range   Sodium 132 (L) 135 - 145 mmol/L   Potassium 3.6 3.5 - 5.1 mmol/L   Chloride 95 (L) 98 - 111 mmol/L   CO2 23 22 - 32 mmol/L   Glucose, Bld 91 70 - 99 mg/dL   BUN <5 (L) 6 - 20 mg/dL   Creatinine, Ser 1.61 0.44 - 1.00 mg/dL   Calcium 8.7 (L) 8.9 - 10.3 mg/dL   Total Protein 7.5 6.5 - 8.1 g/dL   Albumin 3.0 (L) 3.5 - 5.0 g/dL   AST 24 15 - 41 U/L   ALT 19 0 - 44 U/L   Alkaline Phosphatase 97 38 - 126 U/L   Total Bilirubin 0.7 0.3 - 1.2 mg/dL   GFR, Estimated >09 >60 mL/min   Anion gap 14 5 - 15  CBC with Differential     Status: Abnormal   Collection Time: 08/05/23  6:17 PM  Result Value Ref Range   WBC 15.2 (H) 4.0 - 10.5 K/uL   RBC 3.93 3.87 - 5.11 MIL/uL   Hemoglobin 13.2 12.0 - 15.0 g/dL   HCT 45.4 09.8 - 11.9 %   MCV 98.5 80.0 - 100.0 fL   MCH 33.6 26.0 - 34.0 pg   MCHC 34.1 30.0 -  36.0 g/dL   RDW 14.7 (H) 82.9 - 56.2 %   Platelets 326 150 - 400 K/uL   nRBC 0.0 0.0 - 0.2 %   Neutrophils Relative % 75 %   Neutro Abs 11.1 (H) 1.7 - 7.7 K/uL   Lymphocytes Relative 18 %   Lymphs Abs 2.8 0.7 - 4.0 K/uL   Monocytes Relative 7 %   Monocytes Absolute 1.1 (H) 0.1 - 1.0 K/uL   Eosinophils Relative 0 %   Eosinophils Absolute 0.0 0.0 - 0.5 K/uL   Basophils Relative 0 %   Basophils Absolute 0.1 0.0 - 0.1 K/uL   Immature Granulocytes 0 %   Abs Immature Granulocytes 0.06 0.00 - 0.07 K/uL  Protime-INR     Status: None   Collection Time: 08/05/23  6:17 PM  Result Value Ref Range   Prothrombin Time 14.5 11.4 - 15.2 seconds   INR 1.1 0.8 - 1.2  hCG, serum, qualitative     Status: None   Collection Time: 08/05/23  6:17 PM  Result Value Ref Range   Preg, Serum NEGATIVE NEGATIVE  Culture, blood (Routine x 2)     Status: None (Preliminary result)   Collection Time: 08/05/23  6:30 PM   Specimen: BLOOD  Result Value Ref Range   Specimen Description BLOOD LEFT ANTECUBITAL    Special Requests      BOTTLES DRAWN AEROBIC AND ANAEROBIC Blood Culture results may not be optimal due to an inadequate volume of blood received in culture bottles   Culture      NO GROWTH < 12 HOURS Performed at [facility], [address], [place], [place] [address]    Report Status PENDING   I-Stat Lactic Acid, ED     Status: None   Collection Time: 08/05/23  6:41 PM  Result Value Ref Range   Lactic Acid, Venous 0.9 0.5 - 1.9 mmol/L  Resp panel by RT-PCR (RSV, Flu A&B, Covid) Anterior Nasal Swab     Status: None   Collection Time: 08/05/23  8:03 PM   Specimen: Anterior Nasal  Swab  Result Value Ref Range   SARS Coronavirus 2 by RT PCR NEGATIVE NEGATIVE   Influenza A by PCR NEGATIVE NEGATIVE   Influenza B by PCR NEGATIVE NEGATIVE   Resp Syncytial Virus by PCR NEGATIVE NEGATIVE  APTT     Status: Abnormal   Collection Time: 08/05/23  8:04 PM  Result Value Ref Range   aPTT 21 (L) 24 - 36  seconds  I-Stat Lactic Acid, ED     Status: None   Collection Time: 08/05/23  8:09 PM  Result Value Ref Range   Lactic Acid, Venous 1.1 0.5 - 1.9 mmol/L  CBC     Status: Abnormal   Collection Time: 08/06/23  5:33 AM  Result Value Ref Range   WBC 11.0 (H) 4.0 - 10.5 K/uL   RBC 3.38 (L) 3.87 - 5.11 MIL/uL   Hemoglobin 11.3 (L) 12.0 - 15.0 g/dL   HCT 86.5 (L) 78.4 - 69.6 %   MCV 100.3 (H) 80.0 - 100.0 fL   MCH 33.4 26.0 - 34.0 pg   MCHC 33.3 30.0 - 36.0 g/dL   RDW 29.5 (H) 28.4 - 13.2 %   Platelets 275 150 - 400 K/uL   nRBC 0.0 0.0 - 0.2 %  Basic metabolic panel     Status: Abnormal   Collection Time: 08/06/23  5:33 AM  Result Value Ref Range   Sodium 134 (L) 135 - 145 mmol/L   Potassium 3.5 3.5 - 5.1 mmol/L   Chloride 100 98 - 111 mmol/L   CO2 24 22 - 32 mmol/L   Glucose, Bld 91 70 - 99 mg/dL   BUN 5 (L) 6 - 20 mg/dL   Creatinine, Ser 4.40 0.44 - 1.00 mg/dL   Calcium 8.3 (L) 8.9 - 10.3 mg/dL   GFR, Estimated >10 >27 mL/min   Anion gap 10 5 - 15  HIV Antibody (routine testing w rflx)     Status: None   Collection Time: 08/06/23  5:33 AM  Result Value Ref Range   HIV Screen 4th Generation wRfx Non Reactive Non Reactive  Troponin I (High Sensitivity)     Status: None   Collection Time: 08/06/23  5:33 AM  Result Value Ref Range   Troponin I (High Sensitivity) 4 <18 ng/L     Imaging Orders         CT ABDOMEN PELVIS W CONTRAST      Assessment and Plan   Loretta Moore is an 27 y.o. female with Hidradenitis Suppurativa and left groin flair.  I do not see any drainable fluid collection on exam or CT at this time.  Continue medical management.  Please call back if a clear area of fluctuance on exam or fluid collection on imaging forms.    ICD-10-CM   1. Cellulitis of buttock  L03.317     2. Abscess  L02.91     3. Sepsis without acute organ dysfunction, due to unspecified organism (HCC)  A41.9        Quentin Ore, MD  [facility] Use  [email or web address] to contact on call provider  New Patient Billing: [id number] - Straightforward / Low MDM

## 2023-08-06 NOTE — Progress Notes (Signed)
PROGRESS NOTE  Loretta Moore IEP:[id number] DOB: 12/06/1995 DOA: 08/05/2023 PCP: Scheryl Marten, MD   LOS: 1 day   Brief Narrative / Interim history: 27 y.o. female with medical history significant of hidradenitis suppurativa, anemia, asthma presented to ED for evaluation of drainage from her left groin wound with pain.  She is being followed by dermatology as an outpatient and has had several surgical interventions.  She was recently on clindamycin but couple areas in her groin started getting worse and was switched to doxycycline.  They continue to get worse and most recently switched to metronidazole, moxifloxacin as well as rifampin but she has not had a chance to start these antibiotics yet.  Subjective / 24h Interval events: Reports pain and fluctuance in the groin area  Assesement and Plan: Principal Problem:   Hidradenitis suppurativa Active Problems:   Asthma   SIRS (systemic inflammatory response syndrome) (HCC)   Hyponatremia   Principal problem Hidradenitis suppurativa in the perineal area-failed outpatient antibiotic therapy, has been placed on IV, continue -Due to fluctuance in the area, surgery consulted -White count improving  Active problems Asthma-stable, no wheezing, resume home inhaler  Mild hyponatremia-stable  Obesity, morbid-BMI 47.  She would benefit from weight loss  Scheduled Meds:  enoxaparin (LOVENOX) injection  40 mg Subcutaneous Q24H   nicotine  7 mg Transdermal Daily   Continuous Infusions:  aztreonam 1 g (08/06/23 0556)   lactated ringers 150 mL/hr at 08/05/23 2242   metronidazole 500 mg (08/06/23 0843)   vancomycin     PRN Meds:.acetaminophen **OR** acetaminophen, levalbuterol, morphine injection, naLOXone (NARCAN)  injection  Current Outpatient Medications  Medication Instructions   acetaminophen (TYLENOL) 1,000 mg, Oral, Every 6 hours PRN   albuterol (VENTOLIN HFA) 108 (90 Base) MCG/ACT inhaler 1-2 puffs, Inhalation, Every 6 hours  PRN   doxycycline (VIBRAMYCIN) 100 mg, Oral, 2 times daily   gabapentin (NEURONTIN) 200 mg, Oral, Daily at bedtime   Humira (2 Pen) 40 mg, Subcutaneous, Every Mon   lidocaine (LMX) 4 % cream 1 Application, Topical, As needed   metroNIDAZOLE (FLAGYL) 500 mg   moxifloxacin (AVELOX) 400 mg   Multiple Vitamins-Minerals (MULTIVITAMIN GUMMIES ADULT PO) 2 tablets, Oral, Daily   rifampin (RIFADIN) 300 mg, 2 times daily   silver sulfADIAZINE (SILVADENE) 1 % cream 1 Application, Topical, Daily   spironolactone (ALDACTONE) 25 mg, Oral, Daily   triamcinolone cream (KENALOG) 0.1 % 1 Application, Topical, Daily    Diet Orders (From admission, onward)     Start     Ordered   08/05/23 2236  Diet Heart Room service appropriate? Yes; Fluid consistency: Thin  Diet effective now       Question Answer Comment  Room service appropriate? Yes   Fluid consistency: Thin      08/05/23 2239            DVT prophylaxis: enoxaparin (LOVENOX) injection 40 mg Start: 08/06/23 1000   Lab Results  Component Value Date   PLT 275 08/06/2023      Code Status: Full Code  Family Communication: no family present at bedside   Status is: Inpatient Remains inpatient appropriate because: severity of illness  Level of care: Telemetry Medical  Consultants:  Surgery   Objective: Vitals:   08/05/23 2220 08/05/23 2301 08/06/23 0026 08/06/23 0515  BP: 137/73 (!) 127/51 123/67 138/77  Pulse: (!) 115 (!) 117 100 (!) 51  Resp: 19 19 18 18   Temp: 99.1 F (37.3 C) 98.5 F (36.9 C) 98.3 F (  36.8 C) 98.2 F (36.8 C)  TempSrc: Oral Oral Oral Oral  SpO2: 99% 98% 98% 97%  Weight:      Height:       No intake or output data in the 24 hours ending 08/06/23 1027 Wt Readings from Last 3 Encounters:  08/05/23 110.2 kg  08/05/23 110.2 kg  08/24/22 119.2 kg    Examination:  Constitutional: NAD Eyes: no scleral icterus ENMT: Mucous membranes are moist.  Neck: normal, supple Respiratory: clear to  auscultation bilaterally, no wheezing, no crackles. Cardiovascular: Regular rate and rhythm, no murmurs / rubs / gallops. No LE edema.  Abdomen: non distended, no tenderness. Bowel sounds positive.  Musculoskeletal: no clubbing / cyanosis.   Data Reviewed: I have independently reviewed following labs and imaging studies   CBC Recent Labs  Lab 08/05/23 1817 08/06/23 0533  WBC 15.2* 11.0*  HGB 13.2 11.3*  HCT 38.7 33.9*  PLT 326 275  MCV 98.5 100.3*  MCH 33.6 33.4  MCHC 34.1 33.3  RDW 16.9* 16.8*  LYMPHSABS 2.8  --   MONOABS 1.1*  --   EOSABS 0.0  --   BASOSABS 0.1  --     Recent Labs  Lab 08/05/23 1817 08/05/23 1841 08/05/23 2009 08/06/23 0533  NA 132*  --   --  134*  K 3.6  --   --  3.5  CL 95*  --   --  100  CO2 23  --   --  24  GLUCOSE 91  --   --  91  BUN <5*  --   --  5*  CREATININE 0.81  --   --  0.69  CALCIUM 8.7*  --   --  8.3*  AST 24  --   --   --   ALT 19  --   --   --   ALKPHOS 97  --   --   --   BILITOT 0.7  --   --   --   ALBUMIN 3.0*  --   --   --   LATICACIDVEN  --  0.9 1.1  --   INR 1.1  --   --   --     ------------------------------------------------------------------------------------------------------------------ No results for input(s): "CHOL", "HDL", "LDLCALC", "TRIG", "CHOLHDL", "LDLDIRECT" in the last 72 hours.  Lab Results  Component Value Date   HGBA1C 5.7 (H) 01/05/2021   ------------------------------------------------------------------------------------------------------------------ No results for input(s): "TSH", "T4TOTAL", "T3FREE", "THYROIDAB" in the last 72 hours.  Invalid input(s): "FREET3"  Cardiac Enzymes No results for input(s): "CKMB", "TROPONINI", "MYOGLOBIN" in the last 168 hours.  Invalid input(s): "CK" ------------------------------------------------------------------------------------------------------------------ No results found for: "BNP"  CBG: No results for input(s): "GLUCAP" in the last 168  hours.  Recent Results (from the past 240 hour(s))  Culture, blood (Routine x 2)     Status: None (Preliminary result)   Collection Time: 08/05/23  6:13 PM   Specimen: BLOOD  Result Value Ref Range Status   Specimen Description BLOOD RIGHT ANTECUBITAL  Final   Special Requests   Final    BOTTLES DRAWN AEROBIC AND ANAEROBIC Blood Culture adequate volume   Culture   Final    NO GROWTH < 12 HOURS Performed at [facility], [address] [place], [place] [address]    Report Status PENDING  Incomplete  Culture, blood (Routine x 2)     Status: None (Preliminary result)   Collection Time: 08/05/23  6:30 PM   Specimen: BLOOD  Result Value  Ref Range Status   Specimen Description BLOOD LEFT ANTECUBITAL  Final   Special Requests   Final    BOTTLES DRAWN AEROBIC AND ANAEROBIC Blood Culture results may not be optimal due to an inadequate volume of blood received in culture bottles   Culture   Final    NO GROWTH < 12 HOURS Performed at [facility], [address], [place], [place] [address]    Report Status PENDING  Incomplete  Resp panel by RT-PCR (RSV, Flu A&B, Covid) Anterior Nasal Swab     Status: None   Collection Time: 08/05/23  8:03 PM   Specimen: Anterior Nasal Swab  Result Value Ref Range Status   SARS Coronavirus 2 by RT PCR NEGATIVE NEGATIVE Final   Influenza A by PCR NEGATIVE NEGATIVE Final   Influenza B by PCR NEGATIVE NEGATIVE Final    Comment: (NOTE) The Xpert Xpress SARS-CoV-2/FLU/RSV plus assay is intended as an aid in the diagnosis of influenza from Nasopharyngeal swab specimens and should not be used as a sole basis for treatment. Nasal washings and aspirates are unacceptable for Xpert Xpress SARS-CoV-2/FLU/RSV testing.  Fact Sheet for Patients: BloggerCourse.com  Fact Sheet for Healthcare Providers: SeriousBroker.it  This test is not yet approved or cleared by the [place] FDA and has been  authorized for detection and/or diagnosis of SARS-CoV-2 by FDA under an Emergency Use Authorization (EUA). This EUA will remain in effect (meaning this test can be used) for the duration of the COVID-19 declaration under Section 564(b)(1) of the Act, 21 U.S.C. section 360bbb-3(b)(1), unless the authorization is terminated or revoked.     Resp Syncytial Virus by PCR NEGATIVE NEGATIVE Final    Comment: (NOTE) Fact Sheet for Patients: BloggerCourse.com  Fact Sheet for Healthcare Providers: SeriousBroker.it  This test is not yet approved or cleared by the [place] FDA and has been authorized for detection and/or diagnosis of SARS-CoV-2 by FDA under an Emergency Use Authorization (EUA). This EUA will remain in effect (meaning this test can be used) for the duration of the COVID-19 declaration under Section 564(b)(1) of the Act, 21 U.S.C. section 360bbb-3(b)(1), unless the authorization is terminated or revoked.  Performed at [facility], [address], [place], [place] [address]      Radiology Studies: CT ABDOMEN PELVIS W CONTRAST  Result Date: 08/05/2023 CLINICAL DATA:  Abdominal pain. Groin abscess. Pain and swelling LEFT leg EXAM: CT ABDOMEN AND PELVIS WITH CONTRAST TECHNIQUE: Multidetector CT imaging of the abdomen and pelvis was performed using the standard protocol following bolus administration of intravenous contrast. RADIATION DOSE REDUCTION: This exam was performed according to the departmental dose-optimization program which includes automated exposure control, adjustment of the mA and/or kV according to patient size and/or use of iterative reconstruction technique. CONTRAST:  75mL OMNIPAQUE IOHEXOL 350 MG/ML SOLN COMPARISON:  None Available. FINDINGS: Lower chest: Lung bases are clear. Hepatobiliary: No focal hepatic lesion. Normal gallbladder. No biliary duct dilatation. Common bile duct is normal. Pancreas:  Pancreas is normal. No ductal dilatation. No pancreatic inflammation. Spleen: Normal spleen Adrenals/urinary tract: Adrenal glands and kidneys are normal. The ureters and bladder normal. Stomach/Bowel: Stomach, small bowel, appendix, and cecum are normal. The colon and rectosigmoid colon are normal. Vascular/Lymphatic: Abdominal aorta is normal caliber. No periportal or retroperitoneal adenopathy. No pelvic adenopathy. Reproductive: IUD in expected location the uterus.  Ovaries normal. Other: No inguinal adenopathy or infection identified. Musculoskeletal: No aggressive osseous lesion. IMPRESSION: 1. No acute findings in the abdomen pelvis.  2. Normal appendix. 3. No inguinal adenopathy or infection identified. 4. IUD in expected location the uterus. Electronically Signed   By: Genevive Bi M.D.   On: 08/05/2023 21:24     Pamella Pert, MD, PhD [facility]  Between 7 am - 7 pm I am available, please contact me via Amion (for emergencies) or Securechat (non urgent messages)  Between 7 pm - 7 am I am not available, please contact night coverage MD/APP via Amion

## 2023-08-06 NOTE — Plan of Care (Signed)
  Problem: Nutrition: Goal: Adequate nutrition will be maintained Outcome: Progressing   Problem: Pain Managment: Goal: General experience of comfort will improve Outcome: Progressing   Problem: Safety: Goal: Ability to remain free from injury will improve Outcome: Progressing   

## 2023-08-06 NOTE — Consult Note (Signed)
WOC Nurse Consult Note: Reason for Consult: Hidradenitis suppurativa, flair in inguinal folds.  Surgery has consulted and no surgical intervention needed at this time.  Topical therapy will be implemented. Remains on vancomycin.  Wound type: infectious chronic Pressure Injury POA: NA Measurement:  see flow sheets Wound bed: not visible Drainage (amount, consistency, odor) moderate purulence Periwound: tenderness and erythema Dressing procedure/placement/frequency: Cleanse axilla lesions (and any subsequent open HS lesions) with NS and pat dry Apply aquacel to open areas.  [facility] # [id number]) Cover with ABD pads and secure with tape or mesh panties.  Will not follow at this time.  Please re-consult if needed.  Mike Gip MSN, RN, FNP-BC CWON Wound, Ostomy, Continence Nurse Outpatient [facility] [phone] Pager [phone]

## 2023-08-07 DIAGNOSIS — L732 Hidradenitis suppurativa: Secondary | ICD-10-CM

## 2023-08-07 MED ORDER — ORAL CARE MOUTH RINSE: 15.0000 mL | OROMUCOSAL | Status: DC | PRN

## 2023-08-07 MED ORDER — HYDROXYZINE HCL 10 MG PO TABS
10.0000 mg | ORAL_TABLET | Freq: Three times a day (TID) | ORAL | Status: DC | PRN
  Administered 2023-08-07: 10 mg via ORAL
  Filled 2023-08-07: qty 1

## 2023-08-07 MED ORDER — HYDROXYZINE HCL 10 MG PO TABS
10.0000 mg | ORAL_TABLET | Freq: Once | ORAL | Status: AC | PRN
  Administered 2023-08-07: 10 mg via ORAL
  Filled 2023-08-07: qty 1

## 2023-08-07 NOTE — Progress Notes (Signed)
PROGRESS NOTE  Loretta Moore ZOX:[id number] DOB: 1996-04-18 DOA: 08/05/2023 PCP: Scheryl Marten, MD   LOS: 2 days   Brief Narrative / Interim history: 27 y.o. female with medical history significant of hidradenitis suppurativa, anemia, asthma presented to ED for evaluation of drainage from her left groin wound with pain.  She is being followed by dermatology as an outpatient and has had several surgical interventions.  She was recently on clindamycin but couple areas in her groin started getting worse and was switched to doxycycline.  They continue to get worse and most recently switched to metronidazole, moxifloxacin as well as rifampin but she has not had a chance to start these antibiotics yet.  Subjective / 24h Interval events: Still has "hard area" in the perineal area, started to drain on its own  Assesement and Plan: Principal Problem:   Hidradenitis suppurativa Active Problems:   Asthma   SIRS (systemic inflammatory response syndrome) (HCC)   Hyponatremia   Principal problem Hidradenitis suppurativa in the perineal area-failed outpatient antibiotic therapy, has been placed on IV, continue. Due to fluctuance in the area, surgery consulted, did not feel to be clinically significant and surgical team did not recommend I&D but antibiotics alone -White count improving  Active problems Asthma-stable, no wheezing, resume home inhaler  Mild hyponatremia-stable, improving  Obesity, morbid-BMI 47.  She would benefit from weight loss  Scheduled Meds:  enoxaparin (LOVENOX) injection  40 mg Subcutaneous Q24H   gabapentin  200 mg Oral QHS   nicotine  21 mg Transdermal Daily   silver sulfADIAZINE  1 Application Topical Daily   spironolactone  25 mg Oral Daily   triamcinolone cream  1 Application Topical Daily   Continuous Infusions:  aztreonam 1 g (08/07/23 0959)   metronidazole 500 mg (08/07/23 0106)   vancomycin 750 mg (08/07/23 0104)   PRN Meds:.acetaminophen **OR**  acetaminophen, albuterol, lidocaine, morphine injection, naLOXone (NARCAN)  injection, nicotine polacrilex, mouth rinse, oxyCODONE  Current Outpatient Medications  Medication Instructions   acetaminophen (TYLENOL) 1,000 mg, Oral, Every 6 hours PRN   albuterol (VENTOLIN HFA) 108 (90 Base) MCG/ACT inhaler 1-2 puffs, Inhalation, Every 6 hours PRN   doxycycline (VIBRAMYCIN) 100 mg, Oral, 2 times daily   gabapentin (NEURONTIN) 200 mg, Oral, Daily at bedtime   Humira (2 Pen) 40 mg, Subcutaneous, Every Mon   lidocaine (LMX) 4 % cream 1 Application, Topical, As needed   metroNIDAZOLE (FLAGYL) 500 mg   moxifloxacin (AVELOX) 400 mg   Multiple Vitamins-Minerals (MULTIVITAMIN GUMMIES ADULT PO) 2 tablets, Oral, Daily   rifampin (RIFADIN) 300 mg, 2 times daily   silver sulfADIAZINE (SILVADENE) 1 % cream 1 Application, Topical, Daily   spironolactone (ALDACTONE) 25 mg, Oral, Daily   triamcinolone cream (KENALOG) 0.1 % 1 Application, Topical, Daily    Diet Orders (From admission, onward)     Start     Ordered   08/05/23 2236  Diet Heart Room service appropriate? Yes; Fluid consistency: Thin  Diet effective now       Question Answer Comment  Room service appropriate? Yes   Fluid consistency: Thin      08/05/23 2239            DVT prophylaxis: enoxaparin (LOVENOX) injection 40 mg Start: 08/06/23 1000   Lab Results  Component Value Date   PLT 275 08/06/2023      Code Status: Full Code  Family Communication: no family present at bedside   Status is: Inpatient Remains inpatient appropriate because: severity of illness  Level of care: Telemetry Medical  Consultants:  Surgery   Objective: Vitals:   08/06/23 1754 08/06/23 1936 08/07/23 0402 08/07/23 0801  BP: (!) 114/52 130/74 107/73 134/62  Pulse:  (!) 107 90 96  Resp: 17 19 16 18   Temp: 97.8 F (36.6 C) 98.3 F (36.8 C) 98 F (36.7 C) 97.7 F (36.5 C)  TempSrc: Oral Oral Oral Oral  SpO2: 97%  93% 99%  Weight:       Height:        Intake/Output Summary (Last 24 hours) at 08/07/2023 1019 Last data filed at 08/07/2023 0801 Gross per 24 hour  Intake 2169.15 ml  Output 2 ml  Net 2167.15 ml   Wt Readings from Last 3 Encounters:  08/05/23 110.2 kg  08/05/23 110.2 kg  08/24/22 119.2 kg    Examination:  Constitutional: NAD Eyes: lids and conjunctivae normal, no scleral icterus ENMT: mmm Neck: normal, supple Cardiovascular: No LE edema.  Data Reviewed: I have independently reviewed following labs and imaging studies   CBC Recent Labs  Lab 08/05/23 1817 08/06/23 0533  WBC 15.2* 11.0*  HGB 13.2 11.3*  HCT 38.7 33.9*  PLT 326 275  MCV 98.5 100.3*  MCH 33.6 33.4  MCHC 34.1 33.3  RDW 16.9* 16.8*  LYMPHSABS 2.8  --   MONOABS 1.1*  --   EOSABS 0.0  --   BASOSABS 0.1  --     Recent Labs  Lab 08/05/23 1817 08/05/23 1841 08/05/23 2009 08/06/23 0533  NA 132*  --   --  134*  K 3.6  --   --  3.5  CL 95*  --   --  100  CO2 23  --   --  24  GLUCOSE 91  --   --  91  BUN <5*  --   --  5*  CREATININE 0.81  --   --  0.69  CALCIUM 8.7*  --   --  8.3*  AST 24  --   --   --   ALT 19  --   --   --   ALKPHOS 97  --   --   --   BILITOT 0.7  --   --   --   ALBUMIN 3.0*  --   --   --   LATICACIDVEN  --  0.9 1.1  --   INR 1.1  --   --   --     ------------------------------------------------------------------------------------------------------------------ No results for input(s): "CHOL", "HDL", "LDLCALC", "TRIG", "CHOLHDL", "LDLDIRECT" in the last 72 hours.  Lab Results  Component Value Date   HGBA1C 5.7 (H) 01/05/2021   ------------------------------------------------------------------------------------------------------------------ No results for input(s): "TSH", "T4TOTAL", "T3FREE", "THYROIDAB" in the last 72 hours.  Invalid input(s): "FREET3"  Cardiac Enzymes No results for input(s): "CKMB", "TROPONINI", "MYOGLOBIN" in the last 168 hours.  Invalid input(s):  "CK" ------------------------------------------------------------------------------------------------------------------ No results found for: "BNP"  CBG: No results for input(s): "GLUCAP" in the last 168 hours.  Recent Results (from the past 240 hour(s))  Culture, blood (Routine x 2)     Status: None (Preliminary result)   Collection Time: 08/05/23  6:13 PM   Specimen: BLOOD  Result Value Ref Range Status   Specimen Description BLOOD RIGHT ANTECUBITAL  Final   Special Requests   Final    BOTTLES DRAWN AEROBIC AND ANAEROBIC Blood Culture adequate volume   Culture   Final    NO GROWTH 2 DAYS Performed at [facility], [address]  [place], [place] [address]    Report Status PENDING  Incomplete  Culture, blood (Routine x 2)     Status: None (Preliminary result)   Collection Time: 08/05/23  6:30 PM   Specimen: BLOOD  Result Value Ref Range Status   Specimen Description BLOOD LEFT ANTECUBITAL  Final   Special Requests   Final    BOTTLES DRAWN AEROBIC AND ANAEROBIC Blood Culture results may not be optimal due to an inadequate volume of blood received in culture bottles   Culture   Final    NO GROWTH 2 DAYS Performed at [facility], [address], [place], [place] [address]    Report Status PENDING  Incomplete  Resp panel by RT-PCR (RSV, Flu A&B, Covid) Anterior Nasal Swab     Status: None   Collection Time: 08/05/23  8:03 PM   Specimen: Anterior Nasal Swab  Result Value Ref Range Status   SARS Coronavirus 2 by RT PCR NEGATIVE NEGATIVE Final   Influenza A by PCR NEGATIVE NEGATIVE Final   Influenza B by PCR NEGATIVE NEGATIVE Final    Comment: (NOTE) The Xpert Xpress SARS-CoV-2/FLU/RSV plus assay is intended as an aid in the diagnosis of influenza from Nasopharyngeal swab specimens and should not be used as a sole basis for treatment. Nasal washings and aspirates are unacceptable for Xpert Xpress SARS-CoV-2/FLU/RSV testing.  Fact Sheet for  Patients: BloggerCourse.com  Fact Sheet for Healthcare Providers: SeriousBroker.it  This test is not yet approved or cleared by the [place] FDA and has been authorized for detection and/or diagnosis of SARS-CoV-2 by FDA under an Emergency Use Authorization (EUA). This EUA will remain in effect (meaning this test can be used) for the duration of the COVID-19 declaration under Section 564(b)(1) of the Act, 21 U.S.C. section 360bbb-3(b)(1), unless the authorization is terminated or revoked.     Resp Syncytial Virus by PCR NEGATIVE NEGATIVE Final    Comment: (NOTE) Fact Sheet for Patients: BloggerCourse.com  Fact Sheet for Healthcare Providers: SeriousBroker.it  This test is not yet approved or cleared by the [place] FDA and has been authorized for detection and/or diagnosis of SARS-CoV-2 by FDA under an Emergency Use Authorization (EUA). This EUA will remain in effect (meaning this test can be used) for the duration of the COVID-19 declaration under Section 564(b)(1) of the Act, 21 U.S.C. section 360bbb-3(b)(1), unless the authorization is terminated or revoked.  Performed at [facility], [address], [place], [place] [address]      Radiology Studies: No results found.   Pamella Pert, MD, PhD [facility]  Between 7 am - 7 pm I am available, please contact me via Amion (for emergencies) or Securechat (non urgent messages)  Between 7 pm - 7 am I am not available, please contact night coverage MD/APP via Amion

## 2023-08-07 NOTE — Plan of Care (Signed)

## 2023-08-08 DIAGNOSIS — L732 Hidradenitis suppurativa: Secondary | ICD-10-CM | POA: Diagnosis not present

## 2023-08-08 LAB — BASIC METABOLIC PANEL
Anion gap: 9 (ref 5–15)
BUN: <5 mg/dL — ABNORMAL LOW (ref 6–20)
CO2: 26 mmol/L (ref 22–32)
Calcium: 8.6 mg/dL — ABNORMAL LOW (ref 8.9–10.3)
Chloride: 102 mmol/L (ref 98–111)
Creatinine, Ser: 0.69 mg/dL (ref 0.44–1.00)
GFR, Estimated: >60 mL/min (ref >60)
Glucose, Bld: 84 mg/dL (ref 70–99)
Potassium: 3.6 mmol/L (ref 3.5–5.1)
Sodium: 137 mmol/L (ref 135–145)

## 2023-08-08 MED ORDER — NICOTINE 21 MG/24HR TD PT24: 21.0000 mg | MEDICATED_PATCH | TRANSDERMAL | 1 refills | Status: AC

## 2023-08-08 MED ORDER — OXYCODONE HCL 5 MG PO TABS: 5.0000 mg | ORAL_TABLET | ORAL | 0 refills | Status: AC | PRN

## 2023-08-08 NOTE — Discharge Summary (Signed)
Physician Discharge Summary  Loretta Moore WUJ:[id number] DOB: 04/01/1996 DOA: 08/05/2023  PCP: Scheryl Marten, MD  Admit date: 08/05/2023 Discharge date: 08/08/2023  Admitted From: home Disposition:  home  Recommendations for Outpatient Follow-up:  Follow up with PCP in 1-2 weeks Follow-up with dermatology as scheduled  Home Health: none Equipment/Devices: none  Discharge Condition: stable CODE STATUS: Full code  HPI: Per admitting MD, Loretta Moore is a 27 y.o. female with medical history significant of hidradenitis suppurativa, anemia, asthma presented to ED for evaluation of drainage from her left groin wound with pain.  She had surgical intervention done by her dermatologist in June and is currently on doxycycline.  In the ED, patient was tachycardic to the 120s but afebrile.  Not hypotensive.  Labs notable for WBC 15.2, sodium 132, chloride 95, lactic acid normal x 2, blood cultures collected.  CT without evidence of abscess. Patient was given morphine, Zofran, vancomycin, ceftriaxone, and 3.5 L IV fluid boluses. Patient states she has hidradenitis suppurativa and has been on clindamycin for several months due to wounds in her groin region.  States she had surgery done by her dermatologist on her right upper thigh in June and saw dermatology again 2 weeks ago and was told that it was healing well.  However, now having increasing pain and drainage from her left groin/inner gluteal region wounds.  A week ago she was switched from clindamycin to doxycycline.  She has had chills at home but whenever she checked her temperature it was around 97 F.  States about a week ago she had cough and shortness of breath for which she was seen at urgent care and tested negative for COVID.  Her symptoms have now improved.  Denies chest pain.  Hospital Course / Discharge diagnoses: Principal Problem:   Hidradenitis suppurativa Active Problems:   Asthma   SIRS (systemic inflammatory response  syndrome) (HCC)   Hyponatremia   Principal problem Hidradenitis suppurativa in the perineal area-failed outpatient antibiotic therapy initially with clindamycin and then doxycycline.  She was admitted to the hospital and placed on IV antibiotics with significant improvement.  General surgery was also consulted, evaluated patient, did not feel like she needs I&D but antibiotics alone.  Leukocytosis is improved.  Primary dermatologist already called in for her metronidazole, moxifloxacin as well as rifampin that are currently at her pharmacy and has not started them yet.  Given improvement, convert to oral antibiotics as per primary dermatologist, she is to continue oral regimen.  She will follow-up with dermatology as an outpatient  Active problems Asthma-stable, no wheezing Mild hyponatremia-stable, improving Obesity, morbid-BMI 47.  She would benefit from weight loss Tobacco use-counseled for cessation.  Prescribed nicotine patches on discharge  Sepsis ruled out   Discharge Instructions   Allergies as of 08/08/2023       Reactions   Bee Venom Anaphylaxis   Amoxicillin Hives   Augmentin [amoxicillin-pot Clavulanate] Hives   Ceclor [cefaclor] Hives   Penicillins Hives   Has patient had a PCN reaction causing immediate rash, facial/tongue/throat swelling, SOB or lightheadedness with hypotension: Yes Has patient had a PCN reaction causing severe rash involving mucus membranes or skin necrosis: No Has patient had a PCN reaction that required hospitalization No Has patient had a PCN reaction occurring within the last 10 years: No If all of the above answers are "NO", then may proceed with Cephalosporin use.   Sulfa Antibiotics Hives   Tomato Hives   Other Rash   Paper tape causes  rash   Tamiflu [oseltamivir] Swelling, Rash   Facial swelling and all over body rash        Medication List     STOP taking these medications    doxycycline 100 MG capsule Commonly known as:  VIBRAMYCIN       TAKE these medications    acetaminophen 500 MG tablet Commonly known as: TYLENOL Take 1,000 mg by mouth every 6 (six) hours as needed for moderate pain.   albuterol 108 (90 Base) MCG/ACT inhaler Commonly known as: VENTOLIN HFA Inhale 1-2 puffs into the lungs every 6 (six) hours as needed for wheezing or shortness of breath.   gabapentin 100 MG capsule Commonly known as: NEURONTIN Take 200 mg by mouth at bedtime.   Humira (2 Pen) 40 MG/0.4ML pen Generic drug: adalimumab Inject 40 mg into the skin every Monday.   lidocaine 4 % cream Commonly known as: LMX Apply 1 Application topically as needed (for hidradenitis suppurativa).   metroNIDAZOLE 500 MG tablet Commonly known as: FLAGYL Take 500 mg by mouth.   moxifloxacin 400 MG tablet Commonly known as: AVELOX Take 400 mg by mouth.   MULTIVITAMIN GUMMIES ADULT PO Take 2 tablets by mouth daily.   nicotine 21 mg/24hr patch Commonly known as: NICODERM CQ - dosed in mg/24 hours Place 1 patch (21 mg total) onto the skin daily.   oxyCODONE 5 MG immediate release tablet Commonly known as: Oxy IR/ROXICODONE Take 1 tablet (5 mg total) by mouth every 4 (four) hours as needed for severe pain or breakthrough pain.   rifampin 300 MG capsule Commonly known as: RIFADIN Take 300 mg by mouth 2 (two) times daily.   silver sulfADIAZINE 1 % cream Commonly known as: SILVADENE Apply 1 Application topically daily.   spironolactone 25 MG tablet Commonly known as: ALDACTONE Take 25 mg by mouth daily.   triamcinolone cream 0.1 % Commonly known as: KENALOG Apply 1 Application topically daily.        Follow-up Information     Scheryl Marten, MD Follow up in 1 week(s).   Specialty: Internal Medicine Contact information: [address] [place] [address] [phone]                 Consultations: General surgery   Procedures/Studies:  CT ABDOMEN PELVIS W CONTRAST  Result  Date: 08/05/2023 CLINICAL DATA:  Abdominal pain. Groin abscess. Pain and swelling LEFT leg EXAM: CT ABDOMEN AND PELVIS WITH CONTRAST TECHNIQUE: Multidetector CT imaging of the abdomen and pelvis was performed using the standard protocol following bolus administration of intravenous contrast. RADIATION DOSE REDUCTION: This exam was performed according to the departmental dose-optimization program which includes automated exposure control, adjustment of the mA and/or kV according to patient size and/or use of iterative reconstruction technique. CONTRAST:  75mL OMNIPAQUE IOHEXOL 350 MG/ML SOLN COMPARISON:  None Available. FINDINGS: Lower chest: Lung bases are clear. Hepatobiliary: No focal hepatic lesion. Normal gallbladder. No biliary duct dilatation. Common bile duct is normal. Pancreas: Pancreas is normal. No ductal dilatation. No pancreatic inflammation. Spleen: Normal spleen Adrenals/urinary tract: Adrenal glands and kidneys are normal. The ureters and bladder normal. Stomach/Bowel: Stomach, small bowel, appendix, and cecum are normal. The colon and rectosigmoid colon are normal. Vascular/Lymphatic: Abdominal aorta is normal caliber. No periportal or retroperitoneal adenopathy. No pelvic adenopathy. Reproductive: IUD in expected location the uterus.  Ovaries normal. Other: No inguinal adenopathy or infection identified. Musculoskeletal: No aggressive osseous lesion. IMPRESSION: 1. No acute findings in the  abdomen pelvis. 2. Normal appendix. 3. No inguinal adenopathy or infection identified. 4. IUD in expected location the uterus. Electronically Signed   By: Genevive Bi M.D.   On: 08/05/2023 21:24    Subjective: - no chest pain, shortness of breath, no abdominal pain, nausea or vomiting.   Discharge Exam: BP (!) 112/58 (BP Location: Left Arm)   Pulse 83   Temp 98.1 F (36.7 C)   Resp 17   Ht 5' (1.524 m)   Wt 110.2 kg   LMP 07/20/2023 (Exact Date)   SpO2 100%   BMI 47.45 kg/m   General: Pt is  alert, awake, not in acute distress Cardiovascular: RRR, S1/S2 +, no rubs, no gallops Respiratory: CTA bilaterally, no wheezing, no rhonchi Abdominal: Soft, NT, ND, bowel sounds + Extremities: no edema, no cyanosis   The results of significant diagnostics from this hospitalization (including imaging, microbiology, ancillary and laboratory) are listed below for reference.     Microbiology: Recent Results (from the past 240 hour(s))  Culture, blood (Routine x 2)     Status: None (Preliminary result)   Collection Time: 08/05/23  6:13 PM   Specimen: BLOOD  Result Value Ref Range Status   Specimen Description BLOOD RIGHT ANTECUBITAL  Final   Special Requests   Final    BOTTLES DRAWN AEROBIC AND ANAEROBIC Blood Culture adequate volume   Culture   Final    NO GROWTH 3 DAYS Performed at [facility], [address], [place], [place] [address]    Report Status PENDING  Incomplete  Culture, blood (Routine x 2)     Status: None (Preliminary result)   Collection Time: 08/05/23  6:30 PM   Specimen: BLOOD  Result Value Ref Range Status   Specimen Description BLOOD LEFT ANTECUBITAL  Final   Special Requests   Final    BOTTLES DRAWN AEROBIC AND ANAEROBIC Blood Culture results may not be optimal due to an inadequate volume of blood received in culture bottles   Culture   Final    NO GROWTH 3 DAYS Performed at [facility], [address] [place], [place] [address]    Report Status PENDING  Incomplete  Resp panel by RT-PCR (RSV, Flu A&B, Covid) Anterior Nasal Swab     Status: None   Collection Time: 08/05/23  8:03 PM   Specimen: Anterior Nasal Swab  Result Value Ref Range Status   SARS Coronavirus 2 by RT PCR NEGATIVE NEGATIVE Final   Influenza A by PCR NEGATIVE NEGATIVE Final   Influenza B by PCR NEGATIVE NEGATIVE Final    Comment: (NOTE) The Xpert Xpress SARS-CoV-2/FLU/RSV plus assay is intended as an aid in the diagnosis of influenza from Nasopharyngeal swab specimens  and should not be used as a sole basis for treatment. Nasal washings and aspirates are unacceptable for Xpert Xpress SARS-CoV-2/FLU/RSV testing.  Fact Sheet for Patients: BloggerCourse.com  Fact Sheet for Healthcare Providers: SeriousBroker.it  This test is not yet approved or cleared by the [place] FDA and has been authorized for detection and/or diagnosis of SARS-CoV-2 by FDA under an Emergency Use Authorization (EUA). This EUA will remain in effect (meaning this test can be used) for the duration of the COVID-19 declaration under Section 564(b)(1) of the Act, 21 U.S.C. section 360bbb-3(b)(1), unless the authorization is terminated or revoked.     Resp Syncytial Virus by PCR NEGATIVE NEGATIVE Final    Comment: (NOTE) Fact Sheet for Patients: BloggerCourse.com  Fact Sheet for Healthcare Providers:  SeriousBroker.it  This test is not yet approved or cleared by the [place] and has been authorized for detection and/or diagnosis of SARS-CoV-2 by FDA under an Emergency Use Authorization (EUA). This EUA will remain in effect (meaning this test can be used) for the duration of the COVID-19 declaration under Section 564(b)(1) of the Act, 21 U.S.C. section 360bbb-3(b)(1), unless the authorization is terminated or revoked.  Performed at [facility], [address], [place], [place] [address]      Labs: Basic Metabolic Panel: Recent Labs  Lab 08/05/23 1817 08/06/23 0533 08/08/23 0526  NA 132* 134* 137  K 3.6 3.5 3.6  CL 95* 100 102  CO2 23 24 26   GLUCOSE 91 91 84  BUN <5* 5* <5*  CREATININE 0.81 0.69 0.69  CALCIUM 8.7* 8.3* 8.6*   Liver Function Tests: Recent Labs  Lab 08/05/23 1817  AST 24  ALT 19  ALKPHOS 97  BILITOT 0.7  PROT 7.5  ALBUMIN 3.0*   CBC: Recent Labs  Lab 08/05/23 1817 08/06/23 0533  WBC 15.2* 11.0*  NEUTROABS 11.1*  --    HGB 13.2 11.3*  HCT 38.7 33.9*  MCV 98.5 100.3*  PLT 326 275   CBG: No results for input(s): "GLUCAP" in the last 168 hours. Hgb A1c No results for input(s): "HGBA1C" in the last 72 hours. Lipid Profile No results for input(s): "CHOL", "HDL", "LDLCALC", "TRIG", "CHOLHDL", "LDLDIRECT" in the last 72 hours. Thyroid function studies No results for input(s): "TSH", "T4TOTAL", "T3FREE", "THYROIDAB" in the last 72 hours.  Invalid input(s): "FREET3" Urinalysis    Component Value Date/Time   COLORURINE YELLOW 08/05/2023 1813   APPEARANCEUR HAZY (A) 08/05/2023 1813   APPEARANCEUR Clear 04/30/2019 1628   LABSPEC 1.009 08/05/2023 1813   PHURINE 6.0 08/05/2023 1813   GLUCOSEU NEGATIVE 08/05/2023 1813   HGBUR SMALL (A) 08/05/2023 1813   BILIRUBINUR NEGATIVE 08/05/2023 1813   BILIRUBINUR Negative 04/30/2019 1628   KETONESUR NEGATIVE 08/05/2023 1813   PROTEINUR NEGATIVE 08/05/2023 1813   UROBILINOGEN 0.2 06/18/2015 1927   NITRITE NEGATIVE 08/05/2023 1813   LEUKOCYTESUR SMALL (A) 08/05/2023 1813    FURTHER DISCHARGE INSTRUCTIONS:   Get Medicines reviewed and adjusted: Please take all your medications with you for your next visit with your Primary MD   Laboratory/radiological data: Please request your Primary MD to go over all hospital tests and procedure/radiological results at the follow up, please ask your Primary MD to get all Hospital records sent to his/her office.   In some cases, they will be blood work, cultures and biopsy results pending at the time of your discharge. Please request that your primary care M.D. goes through all the records of your hospital data and follows up on these results.   Also Note the following: If you experience worsening of your admission symptoms, develop shortness of breath, life threatening emergency, suicidal or homicidal thoughts you must seek medical attention immediately by calling 911 or calling your MD immediately  if symptoms less severe.    You must read complete instructions/literature along with all the possible adverse reactions/side effects for all the Medicines you take and that have been prescribed to you. Take any new Medicines after you have completely understood and accpet all the possible adverse reactions/side effects.    Do not drive when taking Pain medications or sleeping medications (Benzodaizepines)   Do not take more than prescribed Pain, Sleep and Anxiety Medications. It is not advisable to combine anxiety,sleep and pain medications without  talking with your primary care practitioner   Special Instructions: If you have smoked or chewed Tobacco  in the last 2 yrs please stop smoking, stop any regular Alcohol  and or any Recreational drug use.   Wear Seat belts while driving.   Please note: You were cared for by a hospitalist during your hospital stay. Once you are discharged, your primary care physician will handle any further medical issues. Please note that NO REFILLS for any discharge medications will be authorized once you are discharged, as it is imperative that you return to your primary care physician (or establish a relationship with a primary care physician if you do not have one) for your post hospital discharge needs so that they can reassess your need for medications and monitor your lab values.  Time coordinating discharge: 35 minutes  SIGNED:  Pamella Pert, MD, PhD 08/08/2023, 9:43 AM

## 2023-08-08 NOTE — Discharge Instructions (Signed)
Follow with your dermatologist in 1-2 weeks  Please get a complete blood count and chemistry panel checked by your Primary MD at your next visit, and again as instructed by your Primary MD. Please get your medications reviewed and adjusted by your Primary MD.  Please request your Primary MD to go over all Hospital Tests and Procedure/Radiological results at the follow up, please get all Hospital records sent to your Prim MD by signing hospital release before you go home.  In some cases, there will be blood work, cultures and biopsy results pending at the time of your discharge. Please request that your primary care M.D. goes through all the records of your hospital data and follows up on these results.  If you had Pneumonia of Lung problems at the Hospital: Please get a 2 view Chest X ray done in 6-8 weeks after hospital discharge or sooner if instructed by your Primary MD.  If you have Congestive Heart Failure: Please call your Cardiologist or Primary MD anytime you have any of the following symptoms:  1) 3 pound weight gain in 24 hours or 5 pounds in 1 week  2) shortness of breath, with or without a dry hacking cough  3) swelling in the hands, feet or stomach  4) if you have to sleep on extra pillows at night in order to breathe  Follow cardiac low salt diet and 1.5 lit/day fluid restriction.  If you have diabetes Accuchecks 4 times/day, Once in AM empty stomach and then before each meal. Log in all results and show them to your primary doctor at your next visit. If any glucose reading is under 80 or above 300 call your primary MD immediately.  If you have Seizure/Convulsions/Epilepsy: Please do not drive, operate heavy machinery, participate in activities at heights or participate in high speed sports until you have seen by Primary MD or a Neurologist and advised to do so again. Per [facility] statutes, patients with seizures are not allowed to drive until they have been  seizure-free for six months.  Use caution when using heavy equipment or power tools. Avoid working on ladders or at heights. Take showers instead of baths. Ensure the water temperature is not too high on the home water heater. Do not go swimming alone. Do not lock yourself in a room alone (i.e. bathroom). When caring for infants or small children, sit down when holding, feeding, or changing them to minimize risk of injury to the child in the event you have a seizure. Maintain good sleep hygiene. Avoid alcohol.   If you had Gastrointestinal Bleeding: Please ask your Primary MD to check a complete blood count within one week of discharge or at your next visit. Your endoscopic/colonoscopic biopsies that are pending at the time of discharge, will also need to followed by your Primary MD.  Get Medicines reviewed and adjusted. Please take all your medications with you for your next visit with your Primary MD  Please request your Primary MD to go over all hospital tests and procedure/radiological results at the follow up, please ask your Primary MD to get all Hospital records sent to his/her office.  If you experience worsening of your admission symptoms, develop shortness of breath, life threatening emergency, suicidal or homicidal thoughts you must seek medical attention immediately by calling 911 or calling your MD immediately  if symptoms less severe.  You must read complete instructions/literature along with all the possible adverse reactions/side effects for all the Medicines you take and  that have been prescribed to you. Take any new Medicines after you have completely understood and accpet all the possible adverse reactions/side effects.   Do not drive or operate heavy machinery when taking Pain medications.   Do not take more than prescribed Pain, Sleep and Anxiety Medications  Special Instructions: If you have smoked or chewed Tobacco  in the last 2 yrs please stop smoking, stop any regular  Alcohol  and or any Recreational drug use.  Wear Seat belts while driving.  Please note You were cared for by a hospitalist during your hospital stay. If you have any questions about your discharge medications or the care you received while you were in the hospital after you are discharged, you can call the unit and asked to speak with the hospitalist on call if the hospitalist that took care of you is not available. Once you are discharged, your primary care physician will handle any further medical issues. Please note that NO REFILLS for any discharge medications will be authorized once you are discharged, as it is imperative that you return to your primary care physician (or establish a relationship with a primary care physician if you do not have one) for your aftercare needs so that they can reassess your need for medications and monitor your lab values.  You can reach the hospitalist office at phone [phone] or fax [phone]   If you do not have a primary care physician, you can call [phone] for a physician referral.  Activity: As tolerated with Full fall precautions use walker/cane & assistance as needed    Diet: regular  Disposition Home

## 2023-08-08 NOTE — Plan of Care (Signed)

## 2023-08-09 ENCOUNTER — Telehealth: Payer: Self-pay

## 2023-08-09 NOTE — Transitions of Care (Post Inpatient/ED Visit) (Signed)
   08/09/2023  Name: Loretta Moore MRN: [id number] DOB: 1996-03-28  Today's TOC FU Call Status: Today's TOC FU Call Status:: Unsuccessful Call (1st Attempt) Unsuccessful Call (1st Attempt) Date: 08/09/23  Attempted to reach the patient regarding the most recent Inpatient/ED visit.  Follow Up Plan: Additional outreach attempts will be made to reach the patient to complete the Transitions of Care (Post Inpatient/ED visit) call.   Alyse Low, RN, BA, [facility], CRRN [facility] Coordinator, Transition of Care  Ph # [phone]

## 2023-08-10 LAB — CULTURE, BLOOD (ROUTINE X 2)
Culture: NO GROWTH
Culture: NO GROWTH
Special Requests: ADEQUATE

## 2023-08-11 ENCOUNTER — Telehealth: Payer: Self-pay

## 2023-08-11 NOTE — Transitions of Care (Post Inpatient/ED Visit) (Signed)
   08/11/2023  Name: Aleysa Mayse MRN: [id number] DOB: July 18, 1996  Today's TOC FU Call Status: Today's TOC FU Call Status:: Unsuccessful Call (2nd Attempt) Unsuccessful Call (2nd Attempt) Date: 08/11/23  Attempted to reach the patient regarding the most recent Inpatient/ED visit.  Follow Up Plan: Additional outreach attempts will be made to reach the patient to complete the Transitions of Care (Post Inpatient/ED visit) call.   Alyse Low, RN, BA, [facility], CRRN [facility] Coordinator, Transition of Care Ph # [phone]

## 2023-08-14 ENCOUNTER — Telehealth: Payer: Self-pay

## 2023-08-14 NOTE — Transitions of Care (Post Inpatient/ED Visit) (Signed)
   08/14/2023  Name: Loretta Moore MRN: [id number] DOB: 07-07-1996  Today's TOC FU Call Status: Today's TOC FU Call Status:: Successful TOC FU Call Completed TOC FU Call Complete Date: 08/14/23 Patient's Name and Date of Birth confirmed.  Transition Care Management Follow-up Telephone Call Date of Discharge: 08/08/23 Discharge [facility] Type of Discharge: Inpatient Admission Primary Inpatient Discharge Diagnosis:: "cellulitis of buttock" How have you been since you were released from the hospital?: Worse (Advised patient to call her MD's office as soon as we hung up d/t symptoms she reported of dizziness and nausea) Any questions or concerns?: Yes Patient Questions/Concerns:: Concern re feelings of dizziness and nausea she is experiencing today, she expressed concern that it might be due to her antibiotics or if was due to something else. Patient to call her MD, further Transtion of Call assessment postponed until tomorrow 9/10  Items Reviewed:  Patient to call her own doctor as soon as we ended our call.      Alyse Low, RN, BA, [facility], CRRN [facility] Coordinator, Transition of Care Ph # [phone]

## 2023-08-15 ENCOUNTER — Other Ambulatory Visit: Payer: Medicaid Other

## 2023-08-15 ENCOUNTER — Telehealth: Payer: Self-pay

## 2023-08-15 NOTE — Transitions of Care (Post Inpatient/ED Visit) (Signed)
   08/15/2023  Name: Loretta Moore MRN: [id number] DOB: 1996-11-10  Today's TOC FU Call Status: Unsuccessful Call (2nd Attempt) Date: 08/15/23  Attempted to reach the patient regarding the most recent Inpatient/ED visit. Patient was contacted on 9/9 and complained of symptoms of dizziness and nausea which she thought might be due to her current antibiotic tx. Advised to call her PCP. 9/10 patient just waking up, did call her doctor's office on 9/9 but has not heard back from them. Advised patient to call PCP's office again this morning as patient still c/o nausea and dizziness.  Follow Up Plan: Additional outreach attempts will be made to reach the patient to complete the Transitions of Care (Post Inpatient/ED visit) call.   Alyse Low, RN, BA, [facility], CRRN [facility] Coordinator, Transition of Care Ph # [phone]

## 2023-09-12 DIAGNOSIS — L97112 Non-pressure chronic ulcer of right thigh with fat layer exposed: Secondary | ICD-10-CM | POA: Diagnosis not present

## 2023-09-12 DIAGNOSIS — L732 Hidradenitis suppurativa: Secondary | ICD-10-CM | POA: Diagnosis not present

## 2023-09-12 DIAGNOSIS — T8131XD Disruption of external operation (surgical) wound, not elsewhere classified, subsequent encounter: Secondary | ICD-10-CM | POA: Diagnosis not present

## 2023-09-14 DIAGNOSIS — L732 Hidradenitis suppurativa: Secondary | ICD-10-CM | POA: Diagnosis not present

## 2023-10-05 DIAGNOSIS — R0789 Other chest pain: Secondary | ICD-10-CM | POA: Diagnosis not present

## 2023-10-05 DIAGNOSIS — R079 Chest pain, unspecified: Secondary | ICD-10-CM | POA: Diagnosis not present

## 2023-10-05 DIAGNOSIS — L732 Hidradenitis suppurativa: Secondary | ICD-10-CM | POA: Diagnosis not present

## 2023-10-05 DIAGNOSIS — R000 Tachycardia, unspecified: Secondary | ICD-10-CM | POA: Diagnosis not present

## 2023-10-05 DIAGNOSIS — Z8709 Personal history of other diseases of the respiratory system: Secondary | ICD-10-CM | POA: Diagnosis not present

## 2023-10-05 DIAGNOSIS — F17210 Nicotine dependence, cigarettes, uncomplicated: Secondary | ICD-10-CM | POA: Diagnosis not present

## 2023-10-10 DIAGNOSIS — R079 Chest pain, unspecified: Secondary | ICD-10-CM | POA: Diagnosis not present

## 2023-10-10 DIAGNOSIS — R000 Tachycardia, unspecified: Secondary | ICD-10-CM | POA: Diagnosis not present

## 2023-10-16 DIAGNOSIS — L732 Hidradenitis suppurativa: Secondary | ICD-10-CM | POA: Diagnosis not present

## 2023-10-17 DIAGNOSIS — R000 Tachycardia, unspecified: Secondary | ICD-10-CM | POA: Diagnosis not present

## 2023-10-17 DIAGNOSIS — R079 Chest pain, unspecified: Secondary | ICD-10-CM | POA: Diagnosis not present

## 2023-10-31 DIAGNOSIS — L732 Hidradenitis suppurativa: Secondary | ICD-10-CM | POA: Diagnosis not present

## 2023-10-31 DIAGNOSIS — T8131XD Disruption of external operation (surgical) wound, not elsewhere classified, subsequent encounter: Secondary | ICD-10-CM | POA: Diagnosis not present

## 2023-12-10 DIAGNOSIS — Z881 Allergy status to other antibiotic agents status: Secondary | ICD-10-CM | POA: Diagnosis not present

## 2023-12-10 DIAGNOSIS — L02412 Cutaneous abscess of left axilla: Secondary | ICD-10-CM | POA: Diagnosis not present

## 2023-12-10 DIAGNOSIS — Z885 Allergy status to narcotic agent status: Secondary | ICD-10-CM | POA: Diagnosis not present

## 2023-12-10 DIAGNOSIS — Z91018 Allergy to other foods: Secondary | ICD-10-CM | POA: Diagnosis not present

## 2023-12-10 DIAGNOSIS — F17210 Nicotine dependence, cigarettes, uncomplicated: Secondary | ICD-10-CM | POA: Diagnosis not present

## 2023-12-10 DIAGNOSIS — Z882 Allergy status to sulfonamides status: Secondary | ICD-10-CM | POA: Diagnosis not present

## 2023-12-10 DIAGNOSIS — Z91048 Other nonmedicinal substance allergy status: Secondary | ICD-10-CM | POA: Diagnosis not present

## 2023-12-10 DIAGNOSIS — Z880 Allergy status to penicillin: Secondary | ICD-10-CM | POA: Diagnosis not present

## 2023-12-13 DIAGNOSIS — Z881 Allergy status to other antibiotic agents status: Secondary | ICD-10-CM | POA: Diagnosis not present

## 2023-12-13 DIAGNOSIS — Z882 Allergy status to sulfonamides status: Secondary | ICD-10-CM | POA: Diagnosis not present

## 2023-12-13 DIAGNOSIS — Z79899 Other long term (current) drug therapy: Secondary | ICD-10-CM | POA: Diagnosis not present

## 2023-12-13 DIAGNOSIS — Z91030 Bee allergy status: Secondary | ICD-10-CM | POA: Diagnosis not present

## 2023-12-13 DIAGNOSIS — Z885 Allergy status to narcotic agent status: Secondary | ICD-10-CM | POA: Diagnosis not present

## 2023-12-13 DIAGNOSIS — L02412 Cutaneous abscess of left axilla: Secondary | ICD-10-CM | POA: Diagnosis not present

## 2023-12-13 DIAGNOSIS — L732 Hidradenitis suppurativa: Secondary | ICD-10-CM | POA: Diagnosis not present

## 2023-12-13 DIAGNOSIS — F17200 Nicotine dependence, unspecified, uncomplicated: Secondary | ICD-10-CM | POA: Diagnosis not present

## 2023-12-13 DIAGNOSIS — Z880 Allergy status to penicillin: Secondary | ICD-10-CM | POA: Diagnosis not present

## 2023-12-19 DIAGNOSIS — L732 Hidradenitis suppurativa: Secondary | ICD-10-CM | POA: Diagnosis not present

## 2023-12-19 DIAGNOSIS — S41102A Unspecified open wound of left upper arm, initial encounter: Secondary | ICD-10-CM | POA: Diagnosis not present

## 2024-01-08 DIAGNOSIS — F17200 Nicotine dependence, unspecified, uncomplicated: Secondary | ICD-10-CM | POA: Diagnosis not present

## 2024-01-08 DIAGNOSIS — Z91018 Allergy to other foods: Secondary | ICD-10-CM | POA: Diagnosis not present

## 2024-01-08 DIAGNOSIS — Z882 Allergy status to sulfonamides status: Secondary | ICD-10-CM | POA: Diagnosis not present

## 2024-01-08 DIAGNOSIS — Z880 Allergy status to penicillin: Secondary | ICD-10-CM | POA: Diagnosis not present

## 2024-01-08 DIAGNOSIS — L02412 Cutaneous abscess of left axilla: Secondary | ICD-10-CM | POA: Diagnosis not present

## 2024-01-08 DIAGNOSIS — L732 Hidradenitis suppurativa: Secondary | ICD-10-CM | POA: Diagnosis not present

## 2024-01-08 DIAGNOSIS — J45909 Unspecified asthma, uncomplicated: Secondary | ICD-10-CM | POA: Diagnosis not present

## 2024-01-08 DIAGNOSIS — Z79899 Other long term (current) drug therapy: Secondary | ICD-10-CM | POA: Diagnosis not present

## 2024-01-08 DIAGNOSIS — Z9109 Other allergy status, other than to drugs and biological substances: Secondary | ICD-10-CM | POA: Diagnosis not present

## 2024-01-08 DIAGNOSIS — Z885 Allergy status to narcotic agent status: Secondary | ICD-10-CM | POA: Diagnosis not present

## 2024-02-03 DIAGNOSIS — L0501 Pilonidal cyst with abscess: Secondary | ICD-10-CM | POA: Diagnosis not present

## 2024-02-03 DIAGNOSIS — F17210 Nicotine dependence, cigarettes, uncomplicated: Secondary | ICD-10-CM | POA: Diagnosis not present

## 2024-02-03 DIAGNOSIS — L732 Hidradenitis suppurativa: Secondary | ICD-10-CM | POA: Diagnosis not present

## 2024-02-12 DIAGNOSIS — L988 Other specified disorders of the skin and subcutaneous tissue: Secondary | ICD-10-CM | POA: Diagnosis not present

## 2024-02-12 DIAGNOSIS — L0591 Pilonidal cyst without abscess: Secondary | ICD-10-CM | POA: Diagnosis not present

## 2024-02-12 DIAGNOSIS — L732 Hidradenitis suppurativa: Secondary | ICD-10-CM | POA: Diagnosis not present

## 2024-02-20 DIAGNOSIS — T8189XD Other complications of procedures, not elsewhere classified, subsequent encounter: Secondary | ICD-10-CM | POA: Diagnosis not present

## 2024-02-20 DIAGNOSIS — L732 Hidradenitis suppurativa: Secondary | ICD-10-CM | POA: Diagnosis not present

## 2024-02-20 DIAGNOSIS — S31010D Laceration without foreign body of lower back and pelvis without penetration into retroperitoneum, subsequent encounter: Secondary | ICD-10-CM | POA: Diagnosis not present

## 2024-02-20 DIAGNOSIS — S71111D Laceration without foreign body, right thigh, subsequent encounter: Secondary | ICD-10-CM | POA: Diagnosis not present

## 2024-02-22 DIAGNOSIS — Z880 Allergy status to penicillin: Secondary | ICD-10-CM | POA: Diagnosis not present

## 2024-02-22 DIAGNOSIS — M545 Low back pain, unspecified: Secondary | ICD-10-CM | POA: Diagnosis not present

## 2024-02-22 DIAGNOSIS — Z9109 Other allergy status, other than to drugs and biological substances: Secondary | ICD-10-CM | POA: Diagnosis not present

## 2024-02-22 DIAGNOSIS — L732 Hidradenitis suppurativa: Secondary | ICD-10-CM | POA: Diagnosis not present

## 2024-02-22 DIAGNOSIS — Z882 Allergy status to sulfonamides status: Secondary | ICD-10-CM | POA: Diagnosis not present

## 2024-02-22 DIAGNOSIS — G8929 Other chronic pain: Secondary | ICD-10-CM | POA: Diagnosis not present

## 2024-02-22 DIAGNOSIS — G8918 Other acute postprocedural pain: Secondary | ICD-10-CM | POA: Diagnosis not present

## 2024-02-27 DIAGNOSIS — L732 Hidradenitis suppurativa: Secondary | ICD-10-CM | POA: Diagnosis not present

## 2024-02-27 DIAGNOSIS — L089 Local infection of the skin and subcutaneous tissue, unspecified: Secondary | ICD-10-CM | POA: Diagnosis not present

## 2024-02-27 DIAGNOSIS — F17210 Nicotine dependence, cigarettes, uncomplicated: Secondary | ICD-10-CM | POA: Diagnosis not present

## 2024-02-27 DIAGNOSIS — Z880 Allergy status to penicillin: Secondary | ICD-10-CM | POA: Diagnosis not present

## 2024-02-27 DIAGNOSIS — Z882 Allergy status to sulfonamides status: Secondary | ICD-10-CM | POA: Diagnosis not present

## 2024-02-27 DIAGNOSIS — E669 Obesity, unspecified: Secondary | ICD-10-CM | POA: Diagnosis not present

## 2024-02-27 DIAGNOSIS — Z885 Allergy status to narcotic agent status: Secondary | ICD-10-CM | POA: Diagnosis not present

## 2024-02-27 DIAGNOSIS — Z79899 Other long term (current) drug therapy: Secondary | ICD-10-CM | POA: Diagnosis not present

## 2024-02-28 DIAGNOSIS — Z880 Allergy status to penicillin: Secondary | ICD-10-CM | POA: Diagnosis not present

## 2024-02-28 DIAGNOSIS — T8141XA Infection following a procedure, superficial incisional surgical site, initial encounter: Secondary | ICD-10-CM | POA: Diagnosis not present

## 2024-02-28 DIAGNOSIS — Z6841 Body Mass Index (BMI) 40.0 and over, adult: Secondary | ICD-10-CM | POA: Diagnosis not present

## 2024-02-28 DIAGNOSIS — E669 Obesity, unspecified: Secondary | ICD-10-CM | POA: Diagnosis not present

## 2024-02-28 DIAGNOSIS — T8149XA Infection following a procedure, other surgical site, initial encounter: Secondary | ICD-10-CM | POA: Diagnosis not present

## 2024-02-28 DIAGNOSIS — Z91018 Allergy to other foods: Secondary | ICD-10-CM | POA: Diagnosis not present

## 2024-02-28 DIAGNOSIS — Z882 Allergy status to sulfonamides status: Secondary | ICD-10-CM | POA: Diagnosis not present

## 2024-02-28 DIAGNOSIS — I10 Essential (primary) hypertension: Secondary | ICD-10-CM | POA: Diagnosis not present

## 2024-02-28 DIAGNOSIS — L732 Hidradenitis suppurativa: Secondary | ICD-10-CM | POA: Diagnosis not present

## 2024-02-28 DIAGNOSIS — Z91030 Bee allergy status: Secondary | ICD-10-CM | POA: Diagnosis not present

## 2024-02-28 DIAGNOSIS — L02212 Cutaneous abscess of back [any part, except buttock]: Secondary | ICD-10-CM | POA: Diagnosis not present

## 2024-02-28 DIAGNOSIS — Z885 Allergy status to narcotic agent status: Secondary | ICD-10-CM | POA: Diagnosis not present

## 2024-02-28 DIAGNOSIS — R1084 Generalized abdominal pain: Secondary | ICD-10-CM | POA: Diagnosis not present

## 2024-02-28 DIAGNOSIS — Z79899 Other long term (current) drug therapy: Secondary | ICD-10-CM | POA: Diagnosis not present

## 2024-02-28 DIAGNOSIS — F17210 Nicotine dependence, cigarettes, uncomplicated: Secondary | ICD-10-CM | POA: Diagnosis not present

## 2024-02-28 DIAGNOSIS — Z888 Allergy status to other drugs, medicaments and biological substances status: Secondary | ICD-10-CM | POA: Diagnosis not present

## 2024-02-29 DIAGNOSIS — L02211 Cutaneous abscess of abdominal wall: Secondary | ICD-10-CM | POA: Diagnosis not present

## 2024-02-29 DIAGNOSIS — L732 Hidradenitis suppurativa: Secondary | ICD-10-CM | POA: Diagnosis not present

## 2024-03-01 DIAGNOSIS — T8149XA Infection following a procedure, other surgical site, initial encounter: Secondary | ICD-10-CM | POA: Diagnosis not present

## 2024-03-02 DIAGNOSIS — T8149XA Infection following a procedure, other surgical site, initial encounter: Secondary | ICD-10-CM | POA: Diagnosis not present

## 2024-03-03 DIAGNOSIS — T8149XA Infection following a procedure, other surgical site, initial encounter: Secondary | ICD-10-CM | POA: Diagnosis not present

## 2024-03-05 DIAGNOSIS — L0501 Pilonidal cyst with abscess: Secondary | ICD-10-CM | POA: Diagnosis not present

## 2024-03-05 DIAGNOSIS — L988 Other specified disorders of the skin and subcutaneous tissue: Secondary | ICD-10-CM | POA: Diagnosis not present

## 2024-03-05 DIAGNOSIS — S31119A Laceration without foreign body of abdominal wall, unspecified quadrant without penetration into peritoneal cavity, initial encounter: Secondary | ICD-10-CM | POA: Diagnosis not present

## 2024-03-05 DIAGNOSIS — Z79899 Other long term (current) drug therapy: Secondary | ICD-10-CM | POA: Diagnosis not present

## 2024-03-05 DIAGNOSIS — T8189XA Other complications of procedures, not elsewhere classified, initial encounter: Secondary | ICD-10-CM | POA: Diagnosis not present

## 2024-03-05 DIAGNOSIS — M533 Sacrococcygeal disorders, not elsewhere classified: Secondary | ICD-10-CM | POA: Diagnosis not present

## 2024-03-05 DIAGNOSIS — S31010A Laceration without foreign body of lower back and pelvis without penetration into retroperitoneum, initial encounter: Secondary | ICD-10-CM | POA: Diagnosis not present

## 2024-03-05 DIAGNOSIS — L732 Hidradenitis suppurativa: Secondary | ICD-10-CM | POA: Diagnosis not present

## 2024-03-11 DIAGNOSIS — L732 Hidradenitis suppurativa: Secondary | ICD-10-CM | POA: Diagnosis not present

## 2024-03-13 DIAGNOSIS — T8189XA Other complications of procedures, not elsewhere classified, initial encounter: Secondary | ICD-10-CM | POA: Diagnosis not present

## 2024-03-13 DIAGNOSIS — Z79899 Other long term (current) drug therapy: Secondary | ICD-10-CM | POA: Diagnosis not present

## 2024-03-13 DIAGNOSIS — L0501 Pilonidal cyst with abscess: Secondary | ICD-10-CM | POA: Diagnosis not present

## 2024-03-13 DIAGNOSIS — M533 Sacrococcygeal disorders, not elsewhere classified: Secondary | ICD-10-CM | POA: Diagnosis not present

## 2024-03-13 DIAGNOSIS — L732 Hidradenitis suppurativa: Secondary | ICD-10-CM | POA: Diagnosis not present

## 2024-03-13 DIAGNOSIS — L988 Other specified disorders of the skin and subcutaneous tissue: Secondary | ICD-10-CM | POA: Diagnosis not present

## 2024-03-13 DIAGNOSIS — S31119A Laceration without foreign body of abdominal wall, unspecified quadrant without penetration into peritoneal cavity, initial encounter: Secondary | ICD-10-CM | POA: Diagnosis not present

## 2024-03-13 DIAGNOSIS — S31010A Laceration without foreign body of lower back and pelvis without penetration into retroperitoneum, initial encounter: Secondary | ICD-10-CM | POA: Diagnosis not present

## 2024-03-21 DIAGNOSIS — T8189XA Other complications of procedures, not elsewhere classified, initial encounter: Secondary | ICD-10-CM | POA: Diagnosis not present

## 2024-03-26 DIAGNOSIS — L732 Hidradenitis suppurativa: Secondary | ICD-10-CM | POA: Diagnosis not present

## 2024-03-26 DIAGNOSIS — L0501 Pilonidal cyst with abscess: Secondary | ICD-10-CM | POA: Diagnosis not present

## 2024-03-26 DIAGNOSIS — S31010A Laceration without foreign body of lower back and pelvis without penetration into retroperitoneum, initial encounter: Secondary | ICD-10-CM | POA: Diagnosis not present

## 2024-03-26 DIAGNOSIS — M533 Sacrococcygeal disorders, not elsewhere classified: Secondary | ICD-10-CM | POA: Diagnosis not present

## 2024-03-26 DIAGNOSIS — S31119A Laceration without foreign body of abdominal wall, unspecified quadrant without penetration into peritoneal cavity, initial encounter: Secondary | ICD-10-CM | POA: Diagnosis not present

## 2024-03-26 DIAGNOSIS — Z79899 Other long term (current) drug therapy: Secondary | ICD-10-CM | POA: Diagnosis not present

## 2024-03-26 DIAGNOSIS — S31000D Unspecified open wound of lower back and pelvis without penetration into retroperitoneum, subsequent encounter: Secondary | ICD-10-CM | POA: Diagnosis not present

## 2024-03-26 DIAGNOSIS — T8189XA Other complications of procedures, not elsewhere classified, initial encounter: Secondary | ICD-10-CM | POA: Diagnosis not present

## 2024-03-26 DIAGNOSIS — L988 Other specified disorders of the skin and subcutaneous tissue: Secondary | ICD-10-CM | POA: Diagnosis not present

## 2024-03-27 DIAGNOSIS — R000 Tachycardia, unspecified: Secondary | ICD-10-CM | POA: Diagnosis not present

## 2024-03-27 DIAGNOSIS — F17210 Nicotine dependence, cigarettes, uncomplicated: Secondary | ICD-10-CM | POA: Diagnosis not present

## 2024-03-27 DIAGNOSIS — L732 Hidradenitis suppurativa: Secondary | ICD-10-CM | POA: Diagnosis not present

## 2024-03-29 DIAGNOSIS — T8141XD Infection following a procedure, superficial incisional surgical site, subsequent encounter: Secondary | ICD-10-CM | POA: Diagnosis not present

## 2024-04-01 DIAGNOSIS — T8141XD Infection following a procedure, superficial incisional surgical site, subsequent encounter: Secondary | ICD-10-CM | POA: Diagnosis not present

## 2024-04-03 DIAGNOSIS — T8141XD Infection following a procedure, superficial incisional surgical site, subsequent encounter: Secondary | ICD-10-CM | POA: Diagnosis not present

## 2024-04-04 DIAGNOSIS — Z5181 Encounter for therapeutic drug level monitoring: Secondary | ICD-10-CM | POA: Diagnosis not present

## 2024-04-04 DIAGNOSIS — L732 Hidradenitis suppurativa: Secondary | ICD-10-CM | POA: Diagnosis not present

## 2024-04-05 DIAGNOSIS — T8141XD Infection following a procedure, superficial incisional surgical site, subsequent encounter: Secondary | ICD-10-CM | POA: Diagnosis not present

## 2024-04-08 DIAGNOSIS — T8141XD Infection following a procedure, superficial incisional surgical site, subsequent encounter: Secondary | ICD-10-CM | POA: Diagnosis not present

## 2024-04-08 DIAGNOSIS — F17210 Nicotine dependence, cigarettes, uncomplicated: Secondary | ICD-10-CM | POA: Diagnosis not present

## 2024-04-08 DIAGNOSIS — N764 Abscess of vulva: Secondary | ICD-10-CM | POA: Diagnosis not present

## 2024-04-08 DIAGNOSIS — L732 Hidradenitis suppurativa: Secondary | ICD-10-CM | POA: Diagnosis not present

## 2024-04-10 DIAGNOSIS — L732 Hidradenitis suppurativa: Secondary | ICD-10-CM | POA: Diagnosis not present

## 2024-04-10 DIAGNOSIS — L0591 Pilonidal cyst without abscess: Secondary | ICD-10-CM | POA: Diagnosis not present

## 2024-04-12 DIAGNOSIS — T8141XD Infection following a procedure, superficial incisional surgical site, subsequent encounter: Secondary | ICD-10-CM | POA: Diagnosis not present

## 2024-04-12 DIAGNOSIS — L988 Other specified disorders of the skin and subcutaneous tissue: Secondary | ICD-10-CM | POA: Diagnosis not present

## 2024-04-12 DIAGNOSIS — L732 Hidradenitis suppurativa: Secondary | ICD-10-CM | POA: Diagnosis not present

## 2024-04-12 DIAGNOSIS — Z79899 Other long term (current) drug therapy: Secondary | ICD-10-CM | POA: Diagnosis not present

## 2024-04-12 DIAGNOSIS — T8189XA Other complications of procedures, not elsewhere classified, initial encounter: Secondary | ICD-10-CM | POA: Diagnosis not present

## 2024-04-15 DIAGNOSIS — T8141XD Infection following a procedure, superficial incisional surgical site, subsequent encounter: Secondary | ICD-10-CM | POA: Diagnosis not present

## 2024-04-17 DIAGNOSIS — T8141XD Infection following a procedure, superficial incisional surgical site, subsequent encounter: Secondary | ICD-10-CM | POA: Diagnosis not present

## 2024-04-19 ENCOUNTER — Telehealth: Payer: Self-pay

## 2024-04-19 DIAGNOSIS — O165 Unspecified maternal hypertension, complicating the puerperium: Secondary | ICD-10-CM

## 2024-04-19 DIAGNOSIS — T8141XD Infection following a procedure, superficial incisional surgical site, subsequent encounter: Secondary | ICD-10-CM | POA: Diagnosis not present

## 2024-04-19 DIAGNOSIS — L732 Hidradenitis suppurativa: Secondary | ICD-10-CM

## 2024-04-20 DIAGNOSIS — T8189XA Other complications of procedures, not elsewhere classified, initial encounter: Secondary | ICD-10-CM | POA: Diagnosis not present

## 2024-04-22 ENCOUNTER — Telehealth: Payer: Self-pay

## 2024-04-22 DIAGNOSIS — T8141XD Infection following a procedure, superficial incisional surgical site, subsequent encounter: Secondary | ICD-10-CM | POA: Diagnosis not present

## 2024-04-22 NOTE — Progress Notes (Signed)
 Thank you for this referral. The [facility] team receives referrals for eligible patients: all patients with [facility] Primary Care Provider (any health plan including Medicaid or uninsured) and patients with a [facility] Primary Care Provider AND contracted health plan.     This patient does not have a [facility] or [facility] Primary Care Provider. [facility] cannot directly provide Care Management services to patients who do not have a qualifying Primary Care Provider. Please call us if you need further clarification at [phone].     Loretta [facility]  [facility], [facility] Health Care Management Assistant Direct Dial: [phone]  Fax: [phone]

## 2024-04-24 DIAGNOSIS — T8141XD Infection following a procedure, superficial incisional surgical site, subsequent encounter: Secondary | ICD-10-CM | POA: Diagnosis not present

## 2024-04-26 DIAGNOSIS — T8141XD Infection following a procedure, superficial incisional surgical site, subsequent encounter: Secondary | ICD-10-CM | POA: Diagnosis not present

## 2024-04-30 DIAGNOSIS — T8141XD Infection following a procedure, superficial incisional surgical site, subsequent encounter: Secondary | ICD-10-CM | POA: Diagnosis not present

## 2024-05-03 DIAGNOSIS — T8141XD Infection following a procedure, superficial incisional surgical site, subsequent encounter: Secondary | ICD-10-CM | POA: Diagnosis not present

## 2024-05-07 DIAGNOSIS — T8141XD Infection following a procedure, superficial incisional surgical site, subsequent encounter: Secondary | ICD-10-CM | POA: Diagnosis not present

## 2024-05-07 DIAGNOSIS — T8189XA Other complications of procedures, not elsewhere classified, initial encounter: Secondary | ICD-10-CM | POA: Diagnosis not present

## 2024-05-09 DIAGNOSIS — D539 Nutritional anemia, unspecified: Secondary | ICD-10-CM | POA: Diagnosis not present

## 2024-05-09 DIAGNOSIS — L732 Hidradenitis suppurativa: Secondary | ICD-10-CM | POA: Diagnosis not present

## 2024-05-09 DIAGNOSIS — R5383 Other fatigue: Secondary | ICD-10-CM | POA: Diagnosis not present

## 2024-05-09 DIAGNOSIS — Z79899 Other long term (current) drug therapy: Secondary | ICD-10-CM | POA: Diagnosis not present

## 2024-05-09 DIAGNOSIS — F17210 Nicotine dependence, cigarettes, uncomplicated: Secondary | ICD-10-CM | POA: Diagnosis not present

## 2024-05-10 DIAGNOSIS — T8141XD Infection following a procedure, superficial incisional surgical site, subsequent encounter: Secondary | ICD-10-CM | POA: Diagnosis not present

## 2024-05-14 DIAGNOSIS — T8141XD Infection following a procedure, superficial incisional surgical site, subsequent encounter: Secondary | ICD-10-CM | POA: Diagnosis not present

## 2024-05-17 DIAGNOSIS — L732 Hidradenitis suppurativa: Secondary | ICD-10-CM | POA: Diagnosis not present

## 2024-05-17 DIAGNOSIS — T8141XD Infection following a procedure, superficial incisional surgical site, subsequent encounter: Secondary | ICD-10-CM | POA: Diagnosis not present

## 2024-05-17 DIAGNOSIS — L988 Other specified disorders of the skin and subcutaneous tissue: Secondary | ICD-10-CM | POA: Diagnosis not present

## 2024-05-21 DIAGNOSIS — T8189XA Other complications of procedures, not elsewhere classified, initial encounter: Secondary | ICD-10-CM | POA: Diagnosis not present

## 2024-05-21 DIAGNOSIS — T8141XD Infection following a procedure, superficial incisional surgical site, subsequent encounter: Secondary | ICD-10-CM | POA: Diagnosis not present

## 2024-05-24 DIAGNOSIS — T8141XD Infection following a procedure, superficial incisional surgical site, subsequent encounter: Secondary | ICD-10-CM | POA: Diagnosis not present

## 2024-05-28 DIAGNOSIS — T8141XD Infection following a procedure, superficial incisional surgical site, subsequent encounter: Secondary | ICD-10-CM | POA: Diagnosis not present

## 2024-05-30 DIAGNOSIS — L732 Hidradenitis suppurativa: Secondary | ICD-10-CM | POA: Diagnosis not present

## 2024-05-30 DIAGNOSIS — N764 Abscess of vulva: Secondary | ICD-10-CM | POA: Diagnosis not present

## 2024-05-30 DIAGNOSIS — T8131XD Disruption of external operation (surgical) wound, not elsewhere classified, subsequent encounter: Secondary | ICD-10-CM | POA: Diagnosis not present

## 2024-05-31 DIAGNOSIS — T8141XD Infection following a procedure, superficial incisional surgical site, subsequent encounter: Secondary | ICD-10-CM | POA: Diagnosis not present

## 2024-06-03 DIAGNOSIS — T8141XD Infection following a procedure, superficial incisional surgical site, subsequent encounter: Secondary | ICD-10-CM | POA: Diagnosis not present

## 2024-06-05 DIAGNOSIS — F17210 Nicotine dependence, cigarettes, uncomplicated: Secondary | ICD-10-CM | POA: Diagnosis not present

## 2024-06-05 DIAGNOSIS — L732 Hidradenitis suppurativa: Secondary | ICD-10-CM | POA: Diagnosis not present

## 2024-06-05 DIAGNOSIS — Z79899 Other long term (current) drug therapy: Secondary | ICD-10-CM | POA: Diagnosis not present

## 2024-06-10 DIAGNOSIS — T8141XD Infection following a procedure, superficial incisional surgical site, subsequent encounter: Secondary | ICD-10-CM | POA: Diagnosis not present

## 2024-06-13 DIAGNOSIS — T8141XD Infection following a procedure, superficial incisional surgical site, subsequent encounter: Secondary | ICD-10-CM | POA: Diagnosis not present

## 2024-06-17 DIAGNOSIS — T8141XD Infection following a procedure, superficial incisional surgical site, subsequent encounter: Secondary | ICD-10-CM | POA: Diagnosis not present

## 2024-06-19 DIAGNOSIS — T8141XD Infection following a procedure, superficial incisional surgical site, subsequent encounter: Secondary | ICD-10-CM | POA: Diagnosis not present

## 2024-06-21 DIAGNOSIS — T8141XD Infection following a procedure, superficial incisional surgical site, subsequent encounter: Secondary | ICD-10-CM | POA: Diagnosis not present

## 2024-06-24 DIAGNOSIS — T8141XD Infection following a procedure, superficial incisional surgical site, subsequent encounter: Secondary | ICD-10-CM | POA: Diagnosis not present

## 2024-06-26 DIAGNOSIS — T8141XD Infection following a procedure, superficial incisional surgical site, subsequent encounter: Secondary | ICD-10-CM | POA: Diagnosis not present

## 2024-06-28 DIAGNOSIS — T8141XD Infection following a procedure, superficial incisional surgical site, subsequent encounter: Secondary | ICD-10-CM | POA: Diagnosis not present

## 2024-07-01 DIAGNOSIS — L732 Hidradenitis suppurativa: Secondary | ICD-10-CM | POA: Diagnosis not present

## 2024-07-01 DIAGNOSIS — Z79899 Other long term (current) drug therapy: Secondary | ICD-10-CM | POA: Diagnosis not present

## 2024-07-01 DIAGNOSIS — F17210 Nicotine dependence, cigarettes, uncomplicated: Secondary | ICD-10-CM | POA: Diagnosis not present

## 2024-07-03 DIAGNOSIS — T8131XD Disruption of external operation (surgical) wound, not elsewhere classified, subsequent encounter: Secondary | ICD-10-CM | POA: Diagnosis not present

## 2024-07-03 DIAGNOSIS — L732 Hidradenitis suppurativa: Secondary | ICD-10-CM | POA: Diagnosis not present

## 2024-07-04 ENCOUNTER — Telehealth: Payer: Self-pay

## 2024-07-04 DIAGNOSIS — T8141XD Infection following a procedure, superficial incisional surgical site, subsequent encounter: Secondary | ICD-10-CM | POA: Diagnosis not present

## 2024-07-04 DIAGNOSIS — L732 Hidradenitis suppurativa: Secondary | ICD-10-CM

## 2024-07-04 NOTE — Progress Notes (Signed)
 Thank you for this referral. The [facility] team receives referrals for patients who meet Complex Care Management program criteria: chronic conditions including heart failure, stroke, COPD, ESRD, Sickle Cell, Diabetes with complications, Mental/Behavioral Health diagnosis, substance abuse/misuse and whose Primary Care Provider is a [facility] provider or [facility] contracted Building control surveyor in [place]).  This patient does not have a [facility] or [facility] Primary Care Provider. [facility] cannot directly provide Care Management services to patients who do not have a qualifying Primary Care Provider. Please call us  if you need further clarification at [phone].    Loretta [facility]  [facility], [facility] Health Care Management Assistant Direct Dial: [phone]  Fax: [phone]

## 2024-07-31 DIAGNOSIS — Z79899 Other long term (current) drug therapy: Secondary | ICD-10-CM | POA: Diagnosis not present

## 2024-07-31 DIAGNOSIS — F17210 Nicotine dependence, cigarettes, uncomplicated: Secondary | ICD-10-CM | POA: Diagnosis not present

## 2024-07-31 DIAGNOSIS — L732 Hidradenitis suppurativa: Secondary | ICD-10-CM | POA: Diagnosis not present

## 2024-09-02 DIAGNOSIS — Z79899 Other long term (current) drug therapy: Secondary | ICD-10-CM | POA: Diagnosis not present

## 2024-09-02 DIAGNOSIS — L732 Hidradenitis suppurativa: Secondary | ICD-10-CM | POA: Diagnosis not present

## 2024-09-02 DIAGNOSIS — Z6841 Body Mass Index (BMI) 40.0 and over, adult: Secondary | ICD-10-CM | POA: Diagnosis not present

## 2024-09-02 DIAGNOSIS — R635 Abnormal weight gain: Secondary | ICD-10-CM | POA: Diagnosis not present

## 2024-10-02 DIAGNOSIS — R635 Abnormal weight gain: Secondary | ICD-10-CM | POA: Diagnosis not present

## 2024-10-02 DIAGNOSIS — R5383 Other fatigue: Secondary | ICD-10-CM | POA: Diagnosis not present

## 2024-10-02 DIAGNOSIS — Z6841 Body Mass Index (BMI) 40.0 and over, adult: Secondary | ICD-10-CM | POA: Diagnosis not present

## 2024-10-02 DIAGNOSIS — L732 Hidradenitis suppurativa: Secondary | ICD-10-CM | POA: Diagnosis not present

## 2024-10-02 DIAGNOSIS — R0683 Snoring: Secondary | ICD-10-CM | POA: Diagnosis not present

## 2024-10-02 DIAGNOSIS — E7800 Pure hypercholesterolemia, unspecified: Secondary | ICD-10-CM | POA: Diagnosis not present

## 2024-10-02 DIAGNOSIS — Z79899 Other long term (current) drug therapy: Secondary | ICD-10-CM | POA: Diagnosis not present

## 2024-10-02 DIAGNOSIS — R0602 Shortness of breath: Secondary | ICD-10-CM | POA: Diagnosis not present

## 2024-10-02 DIAGNOSIS — Z131 Encounter for screening for diabetes mellitus: Secondary | ICD-10-CM | POA: Diagnosis not present

## 2024-10-02 DIAGNOSIS — Z3200 Encounter for pregnancy test, result unknown: Secondary | ICD-10-CM | POA: Diagnosis not present

## 2024-10-04 DIAGNOSIS — L732 Hidradenitis suppurativa: Secondary | ICD-10-CM | POA: Diagnosis not present

## 2024-10-05 DIAGNOSIS — L732 Hidradenitis suppurativa: Secondary | ICD-10-CM | POA: Diagnosis not present

## 2024-10-11 DIAGNOSIS — R200 Anesthesia of skin: Secondary | ICD-10-CM | POA: Diagnosis not present

## 2024-10-11 DIAGNOSIS — R202 Paresthesia of skin: Secondary | ICD-10-CM | POA: Diagnosis not present

## 2024-10-17 DIAGNOSIS — L732 Hidradenitis suppurativa: Secondary | ICD-10-CM | POA: Diagnosis not present

## 2024-10-17 DIAGNOSIS — Z5181 Encounter for therapeutic drug level monitoring: Secondary | ICD-10-CM | POA: Diagnosis not present

## 2024-10-29 DIAGNOSIS — Z6841 Body Mass Index (BMI) 40.0 and over, adult: Secondary | ICD-10-CM | POA: Diagnosis not present

## 2024-10-29 DIAGNOSIS — Z79899 Other long term (current) drug therapy: Secondary | ICD-10-CM | POA: Diagnosis not present

## 2024-11-04 DIAGNOSIS — Z79899 Other long term (current) drug therapy: Secondary | ICD-10-CM | POA: Diagnosis not present

## 2024-11-07 DIAGNOSIS — L732 Hidradenitis suppurativa: Secondary | ICD-10-CM | POA: Diagnosis not present

## 2024-11-15 DIAGNOSIS — Z79899 Other long term (current) drug therapy: Secondary | ICD-10-CM | POA: Diagnosis not present

## 2024-11-15 DIAGNOSIS — Z6841 Body Mass Index (BMI) 40.0 and over, adult: Secondary | ICD-10-CM | POA: Diagnosis not present

## 2024-11-15 DIAGNOSIS — L732 Hidradenitis suppurativa: Secondary | ICD-10-CM | POA: Diagnosis not present

## 2024-11-27 DIAGNOSIS — L732 Hidradenitis suppurativa: Secondary | ICD-10-CM | POA: Diagnosis not present
